# Patient Record
Sex: Female | Born: 1935 | Race: White | Hispanic: No | Smoking: Former smoker
Health system: Southern US, Community
[De-identification: ages and names within clinical notes are randomized; demographics above are authoritative.]

## PROBLEM LIST (undated history)

## (undated) DIAGNOSIS — I10 Essential (primary) hypertension: Secondary | ICD-10-CM

## (undated) DIAGNOSIS — D899 Disorder involving the immune mechanism, unspecified: Secondary | ICD-10-CM

## (undated) DIAGNOSIS — M353 Polymyalgia rheumatica: Secondary | ICD-10-CM

## (undated) DIAGNOSIS — I4891 Unspecified atrial fibrillation: Secondary | ICD-10-CM

## (undated) HISTORY — PX: BLADDER SURGERY: SHX569

## (undated) HISTORY — PX: ABDOMINAL HYSTERECTOMY: SHX81

## (undated) HISTORY — PX: OOPHORECTOMY: SHX86

---

## 2012-04-07 DIAGNOSIS — H401290 Low-tension glaucoma, unspecified eye, stage unspecified: Secondary | ICD-10-CM | POA: Insufficient documentation

## 2012-04-07 DIAGNOSIS — Z961 Presence of intraocular lens: Secondary | ICD-10-CM | POA: Insufficient documentation

## 2012-04-21 DIAGNOSIS — Z9889 Other specified postprocedural states: Secondary | ICD-10-CM | POA: Insufficient documentation

## 2012-08-03 DIAGNOSIS — H04129 Dry eye syndrome of unspecified lacrimal gland: Secondary | ICD-10-CM | POA: Insufficient documentation

## 2014-11-11 DIAGNOSIS — S52502A Unspecified fracture of the lower end of left radius, initial encounter for closed fracture: Secondary | ICD-10-CM | POA: Insufficient documentation

## 2016-01-07 ENCOUNTER — Emergency Department (HOSPITAL_BASED_OUTPATIENT_CLINIC_OR_DEPARTMENT_OTHER)
Admission: EM | Admit: 2016-01-07 | Discharge: 2016-01-08 | Disposition: A | Discharge status: Home / Self Care | Payer: Medicare Other | Attending: Emergency Medicine | Admitting: Emergency Medicine

## 2016-01-07 ENCOUNTER — Emergency Department (HOSPITAL_BASED_OUTPATIENT_CLINIC_OR_DEPARTMENT_OTHER): Payer: Medicare Other

## 2016-01-07 ENCOUNTER — Encounter (HOSPITAL_BASED_OUTPATIENT_CLINIC_OR_DEPARTMENT_OTHER): Payer: Self-pay

## 2016-01-07 DIAGNOSIS — Y9389 Activity, other specified: Secondary | ICD-10-CM | POA: Insufficient documentation

## 2016-01-07 DIAGNOSIS — Z87891 Personal history of nicotine dependence: Secondary | ICD-10-CM | POA: Insufficient documentation

## 2016-01-07 DIAGNOSIS — Y999 Unspecified external cause status: Secondary | ICD-10-CM | POA: Diagnosis not present

## 2016-01-07 DIAGNOSIS — I10 Essential (primary) hypertension: Secondary | ICD-10-CM | POA: Diagnosis not present

## 2016-01-07 DIAGNOSIS — Z7982 Long term (current) use of aspirin: Secondary | ICD-10-CM | POA: Diagnosis not present

## 2016-01-07 DIAGNOSIS — W07XXXA Fall from chair, initial encounter: Secondary | ICD-10-CM | POA: Insufficient documentation

## 2016-01-07 DIAGNOSIS — M79672 Pain in left foot: Secondary | ICD-10-CM | POA: Insufficient documentation

## 2016-01-07 DIAGNOSIS — Y929 Unspecified place or not applicable: Secondary | ICD-10-CM | POA: Insufficient documentation

## 2016-01-07 DIAGNOSIS — Z7952 Long term (current) use of systemic steroids: Secondary | ICD-10-CM | POA: Diagnosis not present

## 2016-01-07 DIAGNOSIS — Z79899 Other long term (current) drug therapy: Secondary | ICD-10-CM | POA: Diagnosis not present

## 2016-01-07 DIAGNOSIS — W19XXXA Unspecified fall, initial encounter: Secondary | ICD-10-CM

## 2016-01-07 HISTORY — DX: Essential (primary) hypertension: I10

## 2016-01-07 HISTORY — DX: Polymyalgia rheumatica: M35.3

## 2016-01-07 HISTORY — DX: Disorder involving the immune mechanism, unspecified: D89.9

## 2016-01-07 NOTE — ED Notes (Signed)
MD at bedside discussing test results and dispo plan of care. 

## 2016-01-07 NOTE — ED Notes (Signed)
Pt was able to ambulate but with pain.  Sts she does not walk well without shoes and she does not have her left shoe with her.  Sts she does better with support since she has a high arch.

## 2016-01-07 NOTE — ED Notes (Addendum)
Pt fell asleep in chair-fell out of chair with hx of same for years-injured left foot approx 630pm-pain to left foot-states she did strike her head with no LOC-presents to triage in w/c and adult female

## 2016-01-07 NOTE — Discharge Instructions (Signed)
Return to the ED with any concerns including increased pain, swelling/numbness/discoloration of foot or toes, decreased level of alertness/lethargy, or any other alarming symptoms 

## 2016-01-07 NOTE — ED Provider Notes (Signed)
CSN: [id number]     Arrival date & time 01/07/16  2019 History   By signing my name below, I, Jodi Snyder, attest that this documentation has been prepared under the direction and in the presence of No att. providers found. Electronically Signed: Linus GalasMaharshi Snyder, ED Scribe. 01/08/2016. 10:01 PM.   Chief Complaint  Patient presents with  . Fall   The history is provided by the patient. No language interpreter was used.   HPI Comments: Jodi Snyder is a 82 y.o. female with a PMHx of HTN who presents to the Emergency Department complaining of right foot pain s/p fall 4 hours ago. Pt states she was sitting at the table in a chair writing a paper when she fell asleep and fell out of her chair. She states she hit her head on a hard surface but denies any LOC. Since her fall, she has not tried to bear any weight on her right foot but reports pain with movement. Pt denies any HA, nausea, vomiting, seizures, or any other symptoms at this time. Pt is not on any blood thinners.  Past Medical History  Diagnosis Date  . Hypertension   . Polymyalgia (HCC)   . Immune system disorder [facility](HCC)    Past Surgical History  Procedure Laterality Date  . Oophorectomy    . Abdominal hysterectomy    . Bladder surgery     No family history on file. Social History  Substance Use Topics  . Smoking status: Former Games developermoker  . Smokeless tobacco: None  . Alcohol Use: Yes     Comment: occ   OB History    No data available     Review of Systems  Gastrointestinal: Negative for nausea and vomiting.  Musculoskeletal: Positive for arthralgias.  Neurological: Negative for seizures, syncope and headaches.  All other systems reviewed and are negative.  Allergies  Penicillins  Home Medications   Prior to Admission medications   Medication Sig Start Date End Date Taking? Authorizing Provider  ALPRAZolam Prudy Feeler(XANAX) 0.25 MG tablet Take 0.25 mg by mouth at bedtime as needed for anxiety.   Yes Historical Provider, MD   aspirin 81 MG tablet Take 81 mg by mouth daily.   Yes Historical Provider, MD  bimatoprost (LUMIGAN) 0.03 % ophthalmic solution 1 drop at bedtime.   Yes Historical Provider, MD  brinzolamide (AZOPT) 1 % ophthalmic suspension 1 drop 3 (three) times daily.   Yes Historical Provider, MD  cycloSPORINE (RESTASIS) 0.05 % ophthalmic emulsion 1 drop 2 (two) times daily.   Yes Historical Provider, MD  hydroxychloroquine (PLAQUENIL) 200 MG tablet Take 200 mg by mouth daily.   Yes Historical Provider, MD  lactobacillus acidophilus (BACID) TABS tablet Take 2 tablets by mouth 3 (three) times daily.   Yes Historical Provider, MD  lisinopril (PRINIVIL,ZESTRIL) 20 MG tablet Take by mouth daily.   Yes Historical Provider, MD  predniSONE (DELTASONE) 1 MG tablet Take 1 mg by mouth daily with breakfast.   Yes Historical Provider, MD  traMADol-acetaminophen (ULTRACET) 37.5-325 MG tablet Take 1 tablet by mouth every 6 (six) hours as needed.   Yes Historical Provider, MD   BP 192/91 mmHg  Pulse 78  Temp(Src) 98.3 F (36.8 C) (Oral)  Resp 16  Ht 5\' 3"  (1.6 m)  Wt 47.628 kg  BMI 18.60 kg/m2  SpO2 100%  Vitals reviewed Physical Exam  Physical Examination: General appearance - alert, well appearing, and in no distress Mental status - alert, oriented to person, place, and time Head- NCAT  Eyes - pupils equal and reactive, extraocular eye movements intact Neck - supple, no significant adenopathy, no midline tenderness to palpation Neurological - alert, oriented, normal speech, no focal findings or movement disorder noted Musculoskeletal - no joint tenderness, deformity or swelling- other than left foot with ttp over dorsum of foot and lateral aspect Extremities - peripheral pulses normal, no pedal edema, no clubbing or cyanosis Skin - normal coloration and turgor, no rashes  ED Course  Procedures  DIAGNOSTIC STUDIES: Oxygen Saturation is 100% on room air, normal by my interpretation.    COORDINATION OF  CARE: 9:55 PM Will order left foot x-ray. Discussed treatment plan with pt at bedside and pt agreed to plan.  Labs Review Labs Reviewed - No data to display  Imaging Review Dg Foot Complete Left  01/07/2016  CLINICAL DATA:  Status post fall this evening with pain and swelling across the left first through fifth base of metatarsals. EXAM: LEFT FOOT - COMPLETE 3+ VIEW COMPARISON:  None. FINDINGS: There is no evidence of fracture or dislocation. There is no evidence of arthropathy or other focal bone abnormality. Soft tissues are unremarkable. There is osteopenia. IMPRESSION: No acute fracture or dislocation. Electronically Signed   By: Sherian Rein M.D.   On: 01/07/2016 21:49   I have personally reviewed and evaluated these images and lab results as part of my medical decision-making.   EKG Interpretation None      MDM   Final diagnoses:  Left foot pain  Fall, initial encounter    Pt presenting with pain in left foot after fall out of a chair.  Xray is reassuring.  Pt was able to bear weight  wtihout difficulty- she states she has apost op shoe at home and will wear that- ace wrap applied for her comfort.  Mild head injury, no hematoma- no headache, no LOC, no vomiting or seizure activity.  She does not take blood thinners.  No indication for imaging at this time.  Discharged with strict return precautions.  Pt agreeable with plan.  I personally performed the services described in this documentation, which was scribed in my presence. The recorded information has been reviewed and is accurate.     Jerelyn Scott, MD 01/08/16 [phone]

## 2016-01-16 DIAGNOSIS — N183 Chronic kidney disease, stage 3 unspecified: Secondary | ICD-10-CM | POA: Insufficient documentation

## 2016-01-16 DIAGNOSIS — L930 Discoid lupus erythematosus: Secondary | ICD-10-CM | POA: Insufficient documentation

## 2016-01-16 DIAGNOSIS — F411 Generalized anxiety disorder: Secondary | ICD-10-CM | POA: Insufficient documentation

## 2016-01-16 DIAGNOSIS — M353 Polymyalgia rheumatica: Secondary | ICD-10-CM | POA: Insufficient documentation

## 2016-01-16 DIAGNOSIS — M810 Age-related osteoporosis without current pathological fracture: Secondary | ICD-10-CM | POA: Insufficient documentation

## 2016-01-16 DIAGNOSIS — I129 Hypertensive chronic kidney disease with stage 1 through stage 4 chronic kidney disease, or unspecified chronic kidney disease: Secondary | ICD-10-CM | POA: Insufficient documentation

## 2016-01-28 DIAGNOSIS — Z79899 Other long term (current) drug therapy: Secondary | ICD-10-CM | POA: Insufficient documentation

## 2016-06-21 DIAGNOSIS — H409 Unspecified glaucoma: Secondary | ICD-10-CM | POA: Insufficient documentation

## 2016-11-25 ENCOUNTER — Emergency Department (HOSPITAL_BASED_OUTPATIENT_CLINIC_OR_DEPARTMENT_OTHER)
Admission: EM | Admit: 2016-11-25 | Discharge: 2016-11-25 | Disposition: A | Discharge status: Home / Self Care | Payer: Medicare Other | Attending: Emergency Medicine | Admitting: Emergency Medicine

## 2016-11-25 ENCOUNTER — Encounter (HOSPITAL_BASED_OUTPATIENT_CLINIC_OR_DEPARTMENT_OTHER): Payer: Self-pay

## 2016-11-25 DIAGNOSIS — R200 Anesthesia of skin: Secondary | ICD-10-CM | POA: Insufficient documentation

## 2016-11-25 DIAGNOSIS — Z87891 Personal history of nicotine dependence: Secondary | ICD-10-CM | POA: Diagnosis not present

## 2016-11-25 DIAGNOSIS — Z7982 Long term (current) use of aspirin: Secondary | ICD-10-CM | POA: Insufficient documentation

## 2016-11-25 DIAGNOSIS — M25572 Pain in left ankle and joints of left foot: Secondary | ICD-10-CM | POA: Insufficient documentation

## 2016-11-25 DIAGNOSIS — Z79899 Other long term (current) drug therapy: Secondary | ICD-10-CM | POA: Insufficient documentation

## 2016-11-25 DIAGNOSIS — I10 Essential (primary) hypertension: Secondary | ICD-10-CM | POA: Diagnosis not present

## 2016-11-25 MED ORDER — LIDOCAINE 5 % EX OINT: 1.0000 "application " | TOPICAL_OINTMENT | Freq: Three times a day (TID) | CUTANEOUS | 0 refills | Status: DC | PRN

## 2016-11-25 NOTE — ED Provider Notes (Signed)
[facility] Provider Note   CSN: [id number] Arrival date & time: 11/25/16  1755  By signing my name below, I, Doreatha Martin, attest that this documentation has been prepared under the direction and in the presence of Nira Conn, MD. Electronically Signed: Doreatha Martin, ED Scribe. 11/25/16. 6:25 PM.     History   Chief Complaint Chief Complaint  Patient presents with  . Foot Pain    HPI Jodi Snyder is a 83 y.o. female who presents to the Emergency Department complaining of moderate, gradually worsening lateral left foot pain that began at 2:30 pm after pushing heavy boxes with the foot. She denies hearing a "pop" during the activity. Pt states her pain is worsened with ankle extension and alleviated with Tramadol. She notes that she was pain free prior to this afternoon. She denies additional trauma, injury or falls. Pt reports she sprained the foot in the same area a year ago, and the pain improved quickly after the initial injury. She also complains that her bilateral foot numbness has worsened from her baseline, and reports she will follow up with her PCP regarding the issue. No h/o DM. She denies weakness.    The history is provided by the patient. No language interpreter was used.    Past Medical History:  Diagnosis Date  . Hypertension   . Immune system disorder (HCC)   . Polymyalgia (HCC)     There are no active problems to display for this patient.   Past Surgical History:  Procedure Laterality Date  . ABDOMINAL HYSTERECTOMY    . BLADDER SURGERY    . OOPHORECTOMY      OB History    No data available       Home Medications    Prior to Admission medications   Medication Sig Start Date End Date Taking? Authorizing Provider  ALPRAZolam Prudy Feeler) 0.25 MG tablet Take 0.25 mg by mouth at bedtime as needed for anxiety.    Historical Provider, MD  aspirin 81 MG tablet Take 81 mg by mouth daily.    Historical Provider, MD  bimatoprost (LUMIGAN)  0.03 % ophthalmic solution 1 drop at bedtime.    Historical Provider, MD  brinzolamide (AZOPT) 1 % ophthalmic suspension 1 drop 3 (three) times daily.    Historical Provider, MD  cycloSPORINE (RESTASIS) 0.05 % ophthalmic emulsion 1 drop 2 (two) times daily.    Historical Provider, MD  hydroxychloroquine (PLAQUENIL) 200 MG tablet Take 200 mg by mouth daily.    Historical Provider, MD  lactobacillus acidophilus (BACID) TABS tablet Take 2 tablets by mouth 3 (three) times daily.    Historical Provider, MD  lidocaine (XYLOCAINE) 5 % ointment Apply 1 application topically 3 (three) times daily as needed. 11/25/16   Nira Conn, MD  lisinopril (PRINIVIL,ZESTRIL) 20 MG tablet Take by mouth daily.    Historical Provider, MD  predniSONE (DELTASONE) 1 MG tablet Take 1 mg by mouth daily with breakfast.    Historical Provider, MD  traMADol-acetaminophen (ULTRACET) 37.5-325 MG tablet Take 1 tablet by mouth every 6 (six) hours as needed.    Historical Provider, MD    Family History No family history on file.  Social History Social History  Substance Use Topics  . Smoking status: Former Games developer  . Smokeless tobacco: Never Used  . Alcohol use No     Allergies   Penicillins   Review of Systems Review of Systems  Musculoskeletal: Positive for arthralgias.  Neurological: Positive for numbness. Negative for weakness.  Physical Exam Updated Vital Signs BP (!) 162/84 (BP Location: Right Arm)   Pulse 70   Temp 99.3 F (37.4 C) (Oral)   Resp 18   Ht  (1.6 m)   Wt 110 lb (49.9 kg)   SpO2 100%   BMI 19.49 kg/m   Physical Exam  Constitutional: She is oriented to person, place, and time. She appears well-developed and well-nourished. No distress.  HENT:  Head: Normocephalic and atraumatic.  Right Ear: External ear normal.  Left Ear: External ear normal.  Nose: Nose normal.  Eyes: Conjunctivae and EOM are normal. No scleral icterus.  Neck: Normal range of motion and phonation  normal.  Cardiovascular: Normal rate, regular rhythm and intact distal pulses.   DP pulses 2+ and equal.    Pulmonary/Chest: Effort normal. No stridor. No respiratory distress.  Abdominal: She exhibits no distension.  Musculoskeletal: Normal range of motion. She exhibits tenderness. She exhibits no edema.       Left ankle: She exhibits normal range of motion, no swelling and no deformity. Tenderness. AITFL and CF ligament tenderness found. No lateral malleolus, no medial malleolus, no posterior TFL, no head of 5th metatarsal and no proximal fibula tenderness found. Achilles tendon exhibits no pain, no defect and normal Thompson's test results.       Left foot: There is no tenderness, no bony tenderness, no swelling and no deformity.       Feet:  No peripheral edema. Achilles tendon intact.   Neurological: She is alert and oriented to person, place, and time.  Plantarflexion intact.   Skin: She is not diaphoretic.  Psychiatric: She has a normal mood and affect. Her behavior is normal.  Vitals reviewed.    ED Treatments / Results   DIAGNOSTIC STUDIES: Oxygen Saturation is 100% on RA, normal by my interpretation.    COORDINATION OF CARE: 6:19 PM Discussed treatment plan with pt at bedside which includes ace wrap and pt agreed to plan.    Labs (all labs ordered are listed, but only abnormal results are displayed) Labs Reviewed - No data to display  EKG  EKG Interpretation None       Radiology No results found.  Procedures Procedures (including critical care time)  Medications Ordered in ED Medications - No data to display   Initial Impression / Assessment and Plan / ED Course  I have reviewed the triage vital signs and the nursing notes.  Pertinent labs & imaging results that were available during my care of the patient were reviewed by me and considered in my medical decision making (see chart for details).     Most consistent with MSK pain likely secondary to  strenuous/forceful activity. No evidence to suggest a DVT or arterial occlusion. Low suspicion for septic arthritis. Discussed symptomatic treatment.  The patient is safe for discharge with strict return precautions.   Final Clinical Impressions(s) / ED Diagnoses   Final diagnoses:  Acute left ankle pain   Disposition: Discharge  Condition: Good  I have discussed the results, Dx and Tx plan with the patient who expressed understanding and agree(s) with the plan. Discharge instructions discussed at great length. The patient was given strict return precautions who verbalized understanding of the instructions. No further questions at time of discharge.    New Prescriptions   LIDOCAINE (XYLOCAINE) 5 % OINTMENT    Apply 1 application topically 3 (three) times daily as needed.    Follow Up: Kaylyn Layer. Landry Mellow, MD [address]  [place] [address] [phone]  Schedule an appointment as soon as possible for a visit  in 5-7 days, If symptoms do not improve or  worsen   I personally performed the services described in this documentation, which was scribed in my presence. The recorded information has been reviewed and is accurate.        Nira Conn, MD 11/25/16 [phone]

## 2016-11-25 NOTE — ED Triage Notes (Signed)
c/o pain to left foot since 230pm today-denies injury-presents to triage in w/c-NAD

## 2017-02-03 ENCOUNTER — Emergency Department (HOSPITAL_COMMUNITY)
Admission: EM | Admit: 2017-02-03 | Discharge: 2017-02-03 | Disposition: A | Discharge status: Home / Self Care | Payer: Medicare Other | Attending: Emergency Medicine | Admitting: Emergency Medicine

## 2017-02-03 ENCOUNTER — Emergency Department (HOSPITAL_COMMUNITY): Payer: Medicare Other

## 2017-02-03 ENCOUNTER — Encounter (HOSPITAL_COMMUNITY): Payer: Self-pay

## 2017-02-03 DIAGNOSIS — R0602 Shortness of breath: Secondary | ICD-10-CM | POA: Diagnosis not present

## 2017-02-03 DIAGNOSIS — Z87891 Personal history of nicotine dependence: Secondary | ICD-10-CM | POA: Insufficient documentation

## 2017-02-03 DIAGNOSIS — Z7982 Long term (current) use of aspirin: Secondary | ICD-10-CM | POA: Insufficient documentation

## 2017-02-03 DIAGNOSIS — I4891 Unspecified atrial fibrillation: Secondary | ICD-10-CM

## 2017-02-03 DIAGNOSIS — Z880 Allergy status to penicillin: Secondary | ICD-10-CM | POA: Diagnosis not present

## 2017-02-03 DIAGNOSIS — Z79899 Other long term (current) drug therapy: Secondary | ICD-10-CM | POA: Insufficient documentation

## 2017-02-03 DIAGNOSIS — R079 Chest pain, unspecified: Secondary | ICD-10-CM | POA: Diagnosis present

## 2017-02-03 DIAGNOSIS — I10 Essential (primary) hypertension: Secondary | ICD-10-CM | POA: Diagnosis not present

## 2017-02-03 LAB — CBC
HCT: 44.1 % (ref 36.0–46.0)
Hemoglobin: 14.2 g/dL (ref 12.0–15.0)
MCH: 31.1 pg (ref 26.0–34.0)
MCHC: 32.2 g/dL (ref 30.0–36.0)
MCV: 96.7 fL (ref 78.0–100.0)
PLATELETS: 183 10*3/uL (ref 150–400)
RBC: 4.56 MIL/uL (ref 3.87–5.11)
RDW: 13.5 % (ref 11.5–15.5)
WBC: 6.1 10*3/uL (ref 4.0–10.5)

## 2017-02-03 LAB — BASIC METABOLIC PANEL
ANION GAP: 9 (ref 5–15)
BUN: 25 mg/dL — AB (ref 6–20)
CALCIUM: 9.7 mg/dL (ref 8.9–10.3)
CO2: 27 mmol/L (ref 22–32)
Chloride: 107 mmol/L (ref 101–111)
Creatinine, Ser: 1.03 mg/dL — ABNORMAL HIGH (ref 0.44–1.00)
GFR calc Af Amer: 57 mL/min — ABNORMAL LOW (ref >60)
GFR, EST NON AFRICAN AMERICAN: 49 mL/min — AB (ref >60)
GLUCOSE: 88 mg/dL (ref 65–99)
Potassium: 3.5 mmol/L (ref 3.5–5.1)
Sodium: 143 mmol/L (ref 135–145)

## 2017-02-03 LAB — MAGNESIUM: Magnesium: 2.3 mg/dL (ref 1.7–2.4)

## 2017-02-03 LAB — I-STAT TROPONIN, ED: TROPONIN I, POC: 0.01 ng/mL (ref 0.00–0.08)

## 2017-02-03 MED ORDER — APIXABAN 2.5 MG PO TABS: 2.5000 mg | ORAL_TABLET | Freq: Two times a day (BID) | ORAL | 0 refills | Status: DC

## 2017-02-03 MED ORDER — PROPOFOL 10 MG/ML IV BOLUS
1.0000 mg/kg | Freq: Once | INTRAVENOUS | Status: AC
  Administered 2017-02-03: 48.5 mg via INTRAVENOUS
  Filled 2017-02-03: qty 20

## 2017-02-03 MED ORDER — APIXABAN 2.5 MG PO TABS
2.5000 mg | ORAL_TABLET | Freq: Two times a day (BID) | ORAL | Status: DC
  Administered 2017-02-03: 2.5 mg via ORAL
  Filled 2017-02-03: qty 1

## 2017-02-03 NOTE — ED Provider Notes (Signed)
Complained of chest pain accompanied by lightheadedness and shortness of breath onset 6:30 AM today. She is presently asymptomatic without treatment. Noted by EMS to be in atrial fibrillation with rapid ventricular response is one tender 1 60 bpm. On exam alert and in no distress HEENT exam no facial asymmetry neck supple no bruit lungs clear auscultation heart irregularly irregular tachycardic abdomen nondistended nontender extremities without edema. Skin warm dry. Vagal maneuvers attempted outpatient by forcible Valsalva maneuver. Without change in rate and rhythm. ED ECG REPORT   Date: 02/03/2017  Rate: 130  Rhythm: atrial fibrillation  QRS Axis: normal  Intervals: normal  ST/T Wave abnormalities: nonspecific T wave changes  Conduction Disutrbances:none  Narrative Interpretation:   Old EKG Reviewed: none available  I have personally reviewed the EKG tracing and agree with the computerized printout as noted.  Patient was cardioverted in the emergency department after discussion with Dr.Skains by telephone. Patient is in agreement. Consent obtained for procedural sedation and cardioversion, timeout performed,. I was present and supervised  entire procedure to its conclusion when patient was fully awake at 9:52 AM. See sedation navigator for procedural sedation note. Chest x-ray viewed by me     Doug SouJacubowitz, Kaedyn Belardo, MD 02/03/17 1511

## 2017-02-03 NOTE — Progress Notes (Signed)
ANTICOAGULATION CONSULT NOTE - Initial Consult  Pharmacy Consult for Eliquis  Indication: atrial fibrillation  Allergies  Allergen Reactions  . Penicillins     Patient Measurements: Height: 5\' 3"  (160 cm) Weight: 107 lb (48.5 kg) IBW/kg (Calculated) : 52.4  Vital Signs: Temp: 98.2 F (36.8 C) (06/21 0826) Temp Source: Oral (06/21 0826) BP: 151/113 (06/21 0823) Pulse Rate: 120 (06/21 0823)  Labs:  Recent Labs  02/03/17 0819  HGB 14.2  HCT 44.1  PLT 183  CREATININE 1.03*    Estimated Creatinine Clearance: 32.2 mL/min (A) (by C-G formula based on SCr of 1.03 mg/dL (H)).   Medical History: Past Medical History:  Diagnosis Date  . Hypertension   . Immune system disorder (HCC)   . Polymyalgia [facility](HCC)     Assessment: 83 yo female admitted with new onset Afib. Pharmacy consulted to dose Eliquis. Not on PTA anticoagulation per medication list. CBC stable, SCr 1.03.   Based on patient age 66>80 and weight <60 kg, meets criteria for reduced dose Eliquis at 2.5mg  BID.   Plan:  Eliquis 2.5 mg BID  Monitor for s/s bleeding F/u education    York CeriseKatherine Cook, PharmD Pharmacy Resident  Pager [phone] 02/03/17 9:18 AM

## 2017-02-03 NOTE — ED Provider Notes (Signed)
[facility]EM[facility]ENCY DEPT Provider Note   CSN: [id number] Arrival date & time: 02/03/17  0813     History   Chief Complaint Chief Complaint  Patient presents with  . Chest Pain  . Atrial Fibrillation    HPI Jodi Snyder is a 83 y.o. female.  HPI  83 y.o. female with a hx of HTN, presents to the Emergency Department today due to shortness of breath with chest pain this morning while at rest. This occurred around 0645 this morning. Pt states chest pain lasted 30 min prior to EMS arrival. No N/V. No diaphoresis. CP centralized without radiation. No hx ACS. Denies chest pain currently. Feels minor shortness of breath. No fevers. No URI symptoms. Pt does not take anticoagulation of any kind. No other symptoms noted.    Past Medical History:  Diagnosis Date  . Hypertension   . Immune system disorder (HCC)   . Polymyalgia (HCC)     There are no active problems to display for this patient.   Past Surgical History:  Procedure Laterality Date  . ABDOMINAL HYSTERECTOMY    . BLADDER SURGERY    . OOPHORECTOMY      OB History    No data available       Home Medications    Prior to Admission medications   Medication Sig Start Date End Date Taking? Authorizing Provider  ALPRAZolam Prudy Feeler) 0.25 MG tablet Take 0.25 mg by mouth at bedtime as needed for anxiety.    [provider]  aspirin 81 MG tablet Take 81 mg by mouth daily.    [provider]  bimatoprost (LUMIGAN) 0.03 % ophthalmic solution 1 drop at bedtime.    [provider]  brinzolamide (AZOPT) 1 % ophthalmic suspension 1 drop 3 (three) times daily.    [provider]  cycloSPORINE (RESTASIS) 0.05 % ophthalmic emulsion 1 drop 2 (two) times daily.    [provider]  hydroxychloroquine (PLAQUENIL) 200 MG tablet Take 200 mg by mouth daily.    [provider]  lactobacillus acidophilus (BACID) TABS tablet Take 2 tablets by mouth 3 (three) times daily.    [provider]  lidocaine (XYLOCAINE) 5 % ointment Apply 1 application topically 3 (three) times daily as needed. 11/25/16   Nira Conn, MD  lisinopril (PRINIVIL,ZESTRIL) 20 MG tablet Take by mouth daily.    [provider]  predniSONE (DELTASONE) 1 MG tablet Take 1 mg by mouth daily with breakfast.    [provider]  traMADol-acetaminophen (ULTRACET) 37.5-325 MG tablet Take 1 tablet by mouth every 6 (six) hours as needed.    [provider]    Family History History reviewed. No pertinent family history.  Social History Social History  Substance Use Topics  . Smoking status: Former Games developer  . Smokeless tobacco: Never Used  . Alcohol use No     Allergies   Penicillins   Review of Systems Review of Systems ROS reviewed and all are negative for acute change except as noted in the HPI.  Physical Exam Updated Vital Signs BP (!) 151/113   Pulse (!) 120   Temp 98.2 F (36.8 C) (Oral)   Resp 14   Ht 5\' 3"  (1.6 m)   Wt 48.5 kg (107 lb)   SpO2 100%   BMI 18.95 kg/m   Physical Exam  Constitutional: She is oriented to person, place, and time. Vital signs are normal. She appears well-developed and well-nourished. No distress.  HENT:  Head: Normocephalic and atraumatic.  Right Ear: Hearing, tympanic membrane, external ear and ear canal normal.  Left Ear: Hearing, tympanic membrane, external ear and ear canal normal.  Nose: Nose normal.  Mouth/Throat: Uvula is midline, oropharynx is clear and moist and mucous membranes are normal. No trismus in the jaw. No oropharyngeal exudate, posterior oropharyngeal erythema or tonsillar abscesses.  Eyes: Conjunctivae and EOM are normal. Pupils are equal, round, and reactive to light.  Neck: Normal range of motion. Neck supple. No tracheal deviation present.  Cardiovascular: S1 normal, S2 normal, normal heart sounds, intact distal pulses and normal pulses.  An irregularly irregular rhythm present.  Tachycardia present.   Pulmonary/Chest: Effort normal and breath sounds normal. No respiratory distress. She has no decreased breath sounds. She has no wheezes. She has no rhonchi. She has no rales.  Abdominal: Normal appearance and bowel sounds are normal. There is no tenderness.  Musculoskeletal: Normal range of motion.  Neurological: She is alert and oriented to person, place, and time.  Skin: Skin is warm and dry.  Psychiatric: She has a normal mood and affect. Her speech is normal and behavior is normal. Thought content normal.  Nursing note and vitals reviewed.    ED Treatments / Results  Labs (all labs ordered are listed, but only abnormal results are displayed) Labs Reviewed  BASIC METABOLIC PANEL - Abnormal; Notable for the following:       Result Value   BUN 25 (*)    Creatinine, Ser 1.03 (*)    GFR calc non Af Amer 49 (*)    GFR calc Af Amer 57 (*)    All other components within normal limits  CBC  MAGNESIUM  I-STAT TROPOININ, ED    EKG  EKG Interpretation  Date/Time:  Thursday February 03 2017 09:57:33 EDT Ventricular Rate:  63 PR Interval:    QRS Duration: 86 QT Interval:  421 QTC Calculation: 431 R Axis:   73 Text Interpretation:  Sinus rhythm Anteroseptal infarct, age indeterminate Baseline wander in lead(s) V3 Confirmed by Doug SouJacubowitz, Sam [phone]([id number]) on 02/03/2017 10:22:04 AM       Radiology Dg Chest Portable 1 View  Result Date: 02/03/2017 CLINICAL DATA:  Chest pain EXAM: PORTABLE CHEST 1 VIEW COMPARISON:  None. FINDINGS: There is apical scarring bilaterally. There are scattered calcified granulomas in the right lower lobe. Lungs elsewhere are clear. Heart size and pulmonary vascularity are normal. No adenopathy. There is aortic atherosclerosis. No evident bone lesions. IMPRESSION: Apical scarring bilaterally. Scattered small granulomas. No edema or consolidation. There is aortic atherosclerosis. Electronically Signed   By: Bretta BangWilliam  Woodruff III M.D.   On:  02/03/2017 09:10    Procedures .Cardioversion Date/Time: 02/03/2017 9:59 AM Performed by: Audry PiliMOHR, Shaden Higley Authorized by: Audry PiliMOHR, Sandrika Schwinn   Consent:    Consent obtained:  Verbal and written   Consent given by:  Patient   Risks discussed:  Death and cutaneous burn   Alternatives discussed:  No treatment Pre-procedure details:    Cardioversion basis:  Elective   Rhythm:  Atrial fibrillation   Electrode placement:  Anterior-lateral Attempt one:    Cardioversion mode:  Synchronous   Waveform:  Biphasic   Shock (Joules):  200   Shock outcome:  Conversion to normal sinus rhythm Post-procedure details:    Patient status:  Awake   Patient tolerance of procedure:  Tolerated well, no immediate complications Comments:     Successful Cardioversion.    (including critical care time) CRITICAL CARE Performed by: Eston Estersyler M Shrihaan Porzio   Total critical care time:  40 minutes  Critical care time was exclusive of separately billable procedures and treating other patients.  Critical care was necessary to treat or prevent imminent or life-threatening deterioration.  Critical care was time spent personally by me on the following activities: development of treatment plan with patient and/or surrogate as well as nursing, discussions with consultants, evaluation of patient's response to treatment, examination of patient, obtaining history from patient or surrogate, ordering and performing treatments and interventions, ordering and review of laboratory studies, ordering and review of radiographic studies, pulse oximetry and re-evaluation of patient's condition.   Medications Ordered in ED Medications  apixaban (ELIQUIS) tablet 2.5 mg (not administered)  propofol (DIPRIVAN) 10 mg/mL bolus/IV push 48.5 mg (48.5 mg Intravenous Given 02/03/17 0945)     Initial Impression / Assessment and Plan / ED Course  I have reviewed the triage vital signs and the nursing notes.  Pertinent labs & imaging results that were  available during my care of the patient were reviewed by me and considered in my medical decision making (see chart for details).  Final Clinical Impressions(s) / ED Diagnoses  {I have reviewed and evaluated the relevant laboratory values. {I have reviewed and evaluated the relevant imaging studies. {I have interpreted the relevant EKG. {I have reviewed the relevant previous healthcare records. {I have reviewed EMS Documentation. {I obtained HPI from historian. {Patient discussed with supervising physician.  ED Course:  Assessment: Pt is a 83 y.o. female with hx HTN who presents with chest pain and shortness of breath this AM. Onset 0645. Chest pain lasted 30 min. Centralized. No radiation. No N/V. No diaphoresis. Resolved with EMS arrival. No meds given. No hx ACS. No cardiac hx. No anticoagulation. On exam, pt in NAD. Nontoxic/nonseptic appearing. VS with tachycardia. Afebrile. Lungs CTA. Abdomen nontender soft. EKG shows Afib with RVR. CHADVASc 4 (Age, Female, HTN). Pt within window for possibel cardioversion. Made NPO. Last PO was last night around 2040. Consult to Cardiology (Dr. Anne Fu). Pt candidate for Cardioversion. Will perform in ED. Consulted Pharmacy for anticoagulation. Will Rx Eliquis 2.5mg  BID. CBC unremarkable. BMP unremarkable. Trop negative. CXR unremarkable. Discussed and seen with supervising physician. Plan is to DC home with ambulatory referral to Afib clinic. At time of discharge, Patient is in no acute distress. Vital Signs are stable. Patient is able to ambulate. Patient able to tolerate PO.   Cardioversion with supervising physician present: Time out called at 0944 Propofol given 0945 Cardioverted successfully 0947 Awake and Alert [phone]  Disposition/Plan:  DC Home Additional Verbal discharge instructions given and discussed with patient.  Pt Instructed to f/u with Cardiology in the next week for evaluation and treatment of symptoms. Return precautions given Pt  acknowledges and agrees with plan  Supervising Physician Doug Sou, MD  Final diagnoses:  Atrial fibrillation with rapid ventricular response [facility])    New Prescriptions New Prescriptions   No medications on file       Audry Pili, Cordelia Poche 02/03/17 1022    Doug Sou, MD 02/03/17 1124

## 2017-02-03 NOTE — Discharge Instructions (Signed)
Please read and follow all provided instructions.  Your diagnoses today include:  1. Atrial fibrillation with rapid ventricular response (HCC)     Tests performed today include:  Vital signs. See below for your results today.   Medications prescribed:   Take as prescribed   Home care instructions:  Follow any educational materials contained in this packet.  Follow-up instructions: Please follow-up with Cardiology for further evaluation of symptoms and treatment   Return instructions:   Please return to the Emergency Department if you do not get better, if you get worse, or new symptoms OR  - Fever (temperature greater than 101.11F)  - Bleeding that does not stop with holding pressure to the area    -Severe pain (please note that you may be more sore the day after your accident)  - Chest Pain  - Difficulty breathing  - Severe nausea or vomiting  - Inability to tolerate food and liquids  - Passing out  - Skin becoming red around your wounds  - Change in mental status (confusion or lethargy)  - New numbness or weakness     Please return if you have any other emergent concerns.  Additional Information:  Your vital signs today were: BP (!) 151/113    Pulse (!) 120    Temp 98.2 F (36.8 C) (Oral)    Resp 14    Ht 5\' 3"  (1.6 m)    Wt 48.5 kg (107 lb)    SpO2 100%    BMI 18.95 kg/m  If your blood pressure (BP) was elevated above 135/85 this visit, please have this repeated by your doctor within one month. ---------------  Information on my medicine - ELIQUIS (apixaban)  This medication education was reviewed with me or my healthcare representative as part of my discharge preparation.   Why was Eliquis prescribed for you? Eliquis was prescribed for you to reduce the risk of a blood clot forming that can cause a stroke if you have a medical condition called atrial fibrillation (a type of irregular heartbeat).  What do You need to know about Eliquis ? Take your Eliquis  TWICE DAILY - one tablet in the morning and one tablet in the evening with or without food. If you have difficulty swallowing the tablet whole please discuss with your pharmacist how to take the medication safely.  Take Eliquis exactly as prescribed by your doctor and DO NOT stop taking Eliquis without talking to the doctor who prescribed the medication.  Stopping may increase your risk of developing a stroke.  Refill your prescription before you run out.  After discharge, you should have regular check-up appointments with your healthcare provider that is prescribing your Eliquis.  In the future your dose may need to be changed if your kidney function or weight changes by a significant amount or as you get older.  What do you do if you miss a dose? If you miss a dose, take it as soon as you remember on the same day and resume taking twice daily.  Do not take more than one dose of ELIQUIS at the same time to make up a missed dose.  Important Safety Information A possible side effect of Eliquis is bleeding. You should call your healthcare provider right away if you experience any of the following: ? Bleeding from an injury or your nose that does not stop. ? Unusual colored urine (red or dark brown) or unusual colored stools (red or black). ? Unusual bruising for unknown reasons. ?  A serious fall or if you hit your head (even if there is no bleeding).  Some medicines may interact with Eliquis and might increase your risk of bleeding or clotting while on Eliquis. To help avoid this, consult your healthcare provider or pharmacist prior to using any new prescription or non-prescription medications, including herbals, vitamins, non-steroidal anti-inflammatory drugs (NSAIDs) and supplements.  This website has more information on Eliquis (apixaban): http://www.eliquis.com/eliquis/home

## 2017-02-03 NOTE — ED Triage Notes (Signed)
Pt from home with new on-set A-fib rate 110-160. Pt having mid SOB.

## 2017-02-03 NOTE — Sedation Documentation (Signed)
Shock delivered at 200 j . 

## 2017-02-04 ENCOUNTER — Encounter: Payer: Self-pay | Admitting: Cardiology

## 2017-02-07 ENCOUNTER — Ambulatory Visit (HOSPITAL_COMMUNITY)
Admission: RE | Admit: 2017-02-07 | Discharge: 2017-02-07 | Disposition: A | Discharge status: Home / Self Care | Payer: Medicare Other | Source: Ambulatory Visit | Attending: Nurse Practitioner | Admitting: Nurse Practitioner

## 2017-02-07 ENCOUNTER — Encounter (HOSPITAL_COMMUNITY): Payer: Self-pay | Admitting: Nurse Practitioner

## 2017-02-07 VITALS — BP 144/76 | HR 70 | Ht 63.0 in | Wt 104.0 lb

## 2017-02-07 DIAGNOSIS — Z880 Allergy status to penicillin: Secondary | ICD-10-CM | POA: Diagnosis not present

## 2017-02-07 DIAGNOSIS — I480 Paroxysmal atrial fibrillation: Secondary | ICD-10-CM | POA: Diagnosis not present

## 2017-02-07 DIAGNOSIS — M353 Polymyalgia rheumatica: Secondary | ICD-10-CM | POA: Insufficient documentation

## 2017-02-07 DIAGNOSIS — Z87891 Personal history of nicotine dependence: Secondary | ICD-10-CM | POA: Insufficient documentation

## 2017-02-07 DIAGNOSIS — Z79899 Other long term (current) drug therapy: Secondary | ICD-10-CM | POA: Insufficient documentation

## 2017-02-07 DIAGNOSIS — Z90710 Acquired absence of both cervix and uterus: Secondary | ICD-10-CM | POA: Insufficient documentation

## 2017-02-07 DIAGNOSIS — I10 Essential (primary) hypertension: Secondary | ICD-10-CM | POA: Insufficient documentation

## 2017-02-07 DIAGNOSIS — Z9889 Other specified postprocedural states: Secondary | ICD-10-CM | POA: Diagnosis not present

## 2017-02-07 MED ORDER — DILTIAZEM HCL 30 MG PO TABS: ORAL_TABLET | ORAL | 1 refills | Status: DC

## 2017-02-07 MED ORDER — APIXABAN 2.5 MG PO TABS: 2.5000 mg | ORAL_TABLET | Freq: Two times a day (BID) | ORAL | 6 refills | Status: DC

## 2017-02-07 NOTE — Patient Instructions (Signed)
Your physician has recommended you make the following change in your medication:  1)Stop aspirin 2)Cardizem 30mg  -- take 1 tablet every 4 hours AS NEEDED for afib heart rate >100 as long as top blood pressure >100.

## 2017-02-08 NOTE — Progress Notes (Addendum)
Primary Care Physician: Jodi Cho., MD Referring Physician: [facility] f/u   Jodi Snyder is a 83 y.o. female with a h/o HTN, Polymyalgia that presented to [facility] 6/2,with racing heart beat and lightheadedness, shortness of breath. She was found in afib with  RVR at 160 bpm. She was successfully cardioverted. The only change in her health is that her daily prednisone for polymyalgia had been increased for the last several weeks. She denies any alcohol, excessive caffeine. States sleep study in the last year which was negative. She was placed on apixaban 2.5 mg bid for a chadsvasc score of at least 4.   Today, she denies symptoms of palpitations, chest pain, shortness of breath, orthopnea, PND, lower extremity edema, dizziness, presyncope, syncope, or neurologic sequela. The patient is tolerating medications without difficulties and is otherwise without complaint today.   Past Medical History:  Diagnosis Date  . Hypertension   . Immune system disorder (HCC)   . Polymyalgia (HCC)    Past Surgical History:  Procedure Laterality Date  . ABDOMINAL HYSTERECTOMY    . BLADDER SURGERY    . OOPHORECTOMY      Current Outpatient Prescriptions  Medication Sig Dispense Refill  . ALPRAZolam (XANAX) 0.25 MG tablet Take 0.25 mg by mouth 2 (two) times daily.     Marland Kitchen apixaban (ELIQUIS) 2.5 MG TABS tablet Take 1 tablet (2.5 mg total) by mouth 2 (two) times daily. 60 tablet 6  . bimatoprost (LUMIGAN) 0.03 % ophthalmic solution Place 1 drop into both eyes at bedtime.     . Biotin (BIOTIN 5000) 5 MG CAPS Take 5 mg by mouth daily.    . brinzolamide (AZOPT) 1 % ophthalmic suspension Place 1 drop into the right eye 3 (three) times daily.     . Carboxymethylcell-Hypromellose (GENTEAL OP) Place 1 application into both eyes at bedtime.    . cycloSPORINE (RESTASIS) 0.05 % ophthalmic emulsion Place 1 drop into both eyes 2 (two) times daily.     Marland Kitchen esomeprazole (NEXIUM) 40 MG capsule Take 40 mg by mouth every  morning.    . hydroxychloroquine (PLAQUENIL) 200 MG tablet Take 200 mg by mouth daily.    . Hypromellose (ARTIFICIAL TEARS OP) Place 1 drop into both eyes daily as needed (dry eyes).    Marland Kitchen L-LYSINE PO Take 2 tablets by mouth daily.    Marland Kitchen lisinopril (PRINIVIL,ZESTRIL) 20 MG tablet Take 20 mg by mouth daily.     . Multiple Vitamins-Minerals (ICAPS AREDS 2 PO) Take 2 capsules by mouth 2 (two) times daily.    . predniSONE (DELTASONE) 1 MG tablet Take 6 mg by mouth daily with breakfast.     . Probiotic Product (PROBIOTIC PO) Take 1 tablet by mouth 2 (two) times daily.    . traMADol-acetaminophen (ULTRACET) 37.5-325 MG tablet Take 1 tablet by mouth 2 (two) times daily.     Marland Kitchen diltiazem (CARDIZEM) 30 MG tablet Take 1 tablet every 4 hours AS NEEDED for afib heart rate >100 45 tablet 1   No current facility-administered medications for this encounter.     Allergies  Allergen Reactions  . Penicillins Hives and Swelling    Has patient had a PCN reaction causing immediate rash, facial/tongue/throat swelling, SOB or lightheadedness with hypotension: Yes Has patient had a PCN reaction causing severe rash involving mucus membranes or skin necrosis: No Has patient had a PCN reaction that required hospitalization: No Has patient had a PCN reaction occurring within the last 10 years: No If  all of the above answers are "NO", then may proceed with Cephalosporin use.   . Sulfa Antibiotics Rash    Social History   Social History  . Marital status: Widowed    Spouse name: N/A  . Number of children: N/A  . Years of education: N/A   Occupational History  . Not on file.   Social History Main Topics  . Smoking status: Former Games developermoker  . Smokeless tobacco: Never Used  . Alcohol use No  . Drug use: No  . Sexual activity: Not on file   Other Topics Concern  . Not on file   Social History Narrative  . No narrative on file    No family history on file.  ROS- All systems are reviewed and negative  except as per the HPI above  Physical Exam: Vitals:   02/07/17 1418  BP: (!) 144/76  Pulse: 70  Weight: 104 lb (47.2 kg)  Height: 5\' 3"  (1.6 m)   Wt Readings from Last 3 Encounters:  02/07/17 104 lb (47.2 kg)  02/03/17 107 lb (48.5 kg)  11/25/16 110 lb (49.9 kg)    Labs: Lab Results  Component Value Date   NA 143 02/03/2017   K 3.5 02/03/2017   CL 107 02/03/2017   CO2 27 02/03/2017   GLUCOSE 88 02/03/2017   BUN 25 (H) 02/03/2017   CREATININE 1.03 (H) 02/03/2017   CALCIUM 9.7 02/03/2017   MG 2.3 02/03/2017   No results found for: INR No results found for: CHOL, HDL, LDLCALC, TRIG   GEN- The patient is well appearing, alert and oriented x 3 today.   Head- normocephalic, atraumatic Eyes-  Sclera clear, conjunctiva pink Ears- hearing intact Oropharynx- clear Neck- supple, no JVP Lymph- no cervical lymphadenopathy Lungs- Clear to ausculation bilaterally, normal work of breathing Heart- Regular rate and rhythm, no murmurs, rubs or gallops, PMI not laterally displaced GI- soft, NT, ND, + BS Extremities- no clubbing, cyanosis, or edema MS- no significant deformity or atrophy Skin- no rash or lesion Psych- euthymic mood, full affect Neuro- strength and sensation are intact  EKG-NSR, normal EKG, v rate 70 bpm, qrs int 78 ms, qtc 449 ms Epic records reviewed    Assessment and Plan: 1. Paroxysmal afib Successfully cardioverted General education re afib discussed, triggers discussed  Continue eliquis 2.5 mg bid for the 4 weeks mandatory after cardioversion as well long term with a chadsvasc score of at least 4 Stop asa Bleeding precautions discussed Will RX cardizem 30 mg as needed for breakthrough afib Echo  F/u with Dr. Elberta [facility] as [facility]  [place]burgDonna C. Matthew [place], ANP-C [facility] Clinic [facility] [address] [place], [place] [address] [phone]

## 2017-02-09 ENCOUNTER — Ambulatory Visit (HOSPITAL_BASED_OUTPATIENT_CLINIC_OR_DEPARTMENT_OTHER)
Admission: RE | Admit: 2017-02-09 | Discharge: 2017-02-09 | Disposition: A | Discharge status: Home / Self Care | Payer: Medicare Other | Source: Ambulatory Visit | Attending: Nurse Practitioner | Admitting: Nurse Practitioner

## 2017-02-09 DIAGNOSIS — I081 Rheumatic disorders of both mitral and tricuspid valves: Secondary | ICD-10-CM | POA: Diagnosis not present

## 2017-02-09 DIAGNOSIS — I480 Paroxysmal atrial fibrillation: Secondary | ICD-10-CM | POA: Insufficient documentation

## 2017-02-09 DIAGNOSIS — I10 Essential (primary) hypertension: Secondary | ICD-10-CM | POA: Diagnosis not present

## 2017-02-09 NOTE — Progress Notes (Signed)
  Echocardiogram 2D Echocardiogram has been performed.  Tye SavoyCasey N Sylena Lotter 02/09/2017, 1:54 PM

## 2017-02-22 ENCOUNTER — Other Ambulatory Visit (HOSPITAL_COMMUNITY): Payer: Self-pay | Admitting: Nurse Practitioner

## 2017-02-22 ENCOUNTER — Other Ambulatory Visit (HOSPITAL_COMMUNITY): Payer: Medicare Other

## 2017-02-22 NOTE — Addendum Note (Signed)
Encounter addended by: Newman Niparroll, Jerelyn Trimarco C, NP on: 02/22/2017  1:33 PM<BR>    Actions taken: Sign clinical note

## 2017-02-23 ENCOUNTER — Ambulatory Visit (INDEPENDENT_AMBULATORY_CARE_PROVIDER_SITE_OTHER): Payer: Medicare Other | Admitting: Cardiology

## 2017-02-23 ENCOUNTER — Encounter: Payer: Self-pay | Admitting: Cardiology

## 2017-02-23 VITALS — BP 167/84 | HR 62 | Ht 63.0 in | Wt 105.0 lb

## 2017-02-23 DIAGNOSIS — I480 Paroxysmal atrial fibrillation: Secondary | ICD-10-CM

## 2017-02-23 DIAGNOSIS — I10 Essential (primary) hypertension: Secondary | ICD-10-CM

## 2017-02-23 NOTE — Patient Instructions (Addendum)
Medication Instructions:    Your physician recommends that you continue on your current medications as directed. Please refer to the Current Medication list given to you today.  - If you need a refill on your cardiac medications before your next appointment, please call your pharmacy.   Labwork:  None ordered  Testing/Procedures:  None ordered  Follow-Up:  Your physician recommends that you schedule a follow-up appointment in: 3 months with Dr. Camnitz.  Thank you for choosing [facility]!!   Sherri Price, RN [phone]  Any Other Special Instructions Will Be Listed Below (If Applicable).       

## 2017-02-23 NOTE — Progress Notes (Signed)
Electrophysiology Office Note   Date:  02/23/2017   ID:  Jodi GamblesMargaret Snyder, DOB May 10, 1932, MRN [id number]  PCP:  Elspeth Choerrell, Grace E., MD  Cardiologist:  none Primary Electrophysiologist:  Marrissa Dai Jorja LoaMartin Tanis Hensarling, MD    Chief Complaint  Patient presents with  . New Patient (Initial Visit)    PAF     History of Present Illness: Jodi Snyder is a 83 y.o. female who is being seen today for the evaluation of atrial fibrillation at the request of Elspeth Choerrell, Grace E., MD. Presenting today for electrophysiology evaluation. History of HTN and polymyalgia. She presented to the ER on 6/2 with palpitations, lightheadedness, SOB. Found to be in AF with RVR with rates of 160 bpm. Successfully cardioverted. Recently had prednisone increased for polymyalgia. Was placed on Eliquis. She has diltiazem as needed for AF but is not on chronic medications.    Today, she denies symptoms of palpitations, chest pain, shortness of breath, orthopnea, PND, lower extremity edema, claudication, dizziness, presyncope, syncope, bleeding, or neurologic sequela. The patient is tolerating medications without difficulties.    Past Medical History:  Diagnosis Date  . Hypertension   . Immune system disorder (HCC)   . Polymyalgia (HCC)    Past Surgical History:  Procedure Laterality Date  . ABDOMINAL HYSTERECTOMY    . BLADDER SURGERY    . OOPHORECTOMY       Current Outpatient Prescriptions  Medication Sig Dispense Refill  . ALPRAZolam (XANAX) 0.25 MG tablet Take 0.25 mg by mouth 2 (two) times daily.     . bimatoprost (LUMIGAN) 0.03 % ophthalmic solution Place 1 drop into both eyes at bedtime.     . Biotin (BIOTIN 5000) 5 MG CAPS Take 5 mg by mouth daily.    . brinzolamide (AZOPT) 1 % ophthalmic suspension Place 1 drop into the right eye 3 (three) times daily.     . Carboxymethylcell-Hypromellose (GENTEAL OP) Place 1 application into both eyes at bedtime.    . cycloSPORINE (RESTASIS) 0.05 % ophthalmic emulsion Place  1 drop into both eyes 2 (two) times daily.     Marland Kitchen. diltiazem (CARDIZEM) 30 MG tablet Take 1 tablet every 4 hours AS NEEDED for afib heart rate >100 45 tablet 1  . ELIQUIS 2.5 MG TABS tablet TAKE 1 TABLET BY MOUTH TWICE DAILY 60 tablet 6  . esomeprazole (NEXIUM) 40 MG capsule Take 40 mg by mouth every morning.    . hydroxychloroquine (PLAQUENIL) 200 MG tablet Take 200 mg by mouth daily.    . Hypromellose (ARTIFICIAL TEARS OP) Place 1 drop into both eyes daily as needed (dry eyes).    Marland Kitchen. L-LYSINE PO Take 2 tablets by mouth daily.    Marland Kitchen. lisinopril (PRINIVIL,ZESTRIL) 20 MG tablet Take 20 mg by mouth daily.     . Multiple Vitamins-Minerals (ICAPS AREDS 2 PO) Take 2 capsules by mouth 2 (two) times daily.    . predniSONE (DELTASONE) 1 MG tablet Take 6 mg by mouth daily with breakfast.     . Probiotic Product (PROBIOTIC PO) Take 1 tablet by mouth 2 (two) times daily.    . traMADol-acetaminophen (ULTRACET) 37.5-325 MG tablet Take 1 tablet by mouth 2 (two) times daily.      No current facility-administered medications for this visit.     Allergies:   Penicillins and Sulfa antibiotics   Social History:  The patient  reports that she has quit smoking. She has never used smokeless tobacco. She reports that she does not drink alcohol or use drugs.  Family History:  The patient's family history includes Anuerysm in her father; Heart attack in her mother; Stroke in her father.    ROS:  Please see the history of present illness.   Otherwise, review of systems is positive for none.   All other systems are reviewed and negative.    PHYSICAL EXAM: VS:  BP (!) 167/84 (BP Location: Right Arm)   Pulse 62   Ht 5\' 3"  (1.6 m)   Wt 105 lb (47.6 kg)   BMI 18.60 kg/m  , BMI Body mass index is 18.6 kg/m. GEN: Well nourished, well developed, in no acute distress  HEENT: normal  Neck: no JVD, carotid bruits, or masses Cardiac: RRR; no murmurs, rubs, or gallops,no edema  Respiratory:  clear to auscultation  bilaterally, normal work of breathing GI: soft, nontender, nondistended, + BS MS: no deformity or atrophy  Skin: warm and dry Neuro:  Strength and sensation are intact Psych: euthymic mood, full affect  EKG:  EKG is not ordered today. Personal review of the ekg ordered 02/07/17 shows sinus rhythm, rate 70  Recent Labs: 02/03/2017: BUN 25; Creatinine, Ser 1.03; Hemoglobin 14.2; Magnesium 2.3; Platelets 183; Potassium 3.5; Sodium 143    Lipid Panel  No results found for: CHOL, TRIG, HDL, CHOLHDL, VLDL, LDLCALC, LDLDIRECT   Wt Readings from Last 3 Encounters:  02/23/17 105 lb (47.6 kg)  02/07/17 104 lb (47.2 kg)  02/03/17 107 lb (48.5 kg)      Other studies Reviewed: Additional studies/ records that were reviewed today include: TTE 02/09/17  Review of the above records today demonstrates:  - Left ventricle: The cavity size was normal. Wall thickness was   normal. Systolic function was normal. The estimated ejection   fraction was in the range of 60% to 65%. Wall motion was normal;   there were no regional wall motion abnormalities. Features are   consistent with a pseudonormal left ventricular filling pattern,   with concomitant abnormal relaxation and increased filling   pressure (grade 2 diastolic dysfunction). - Aortic valve: Mildly calcified annulus. - Mitral valve: There was mild regurgitation. - Pulmonary arteries: Systolic pressure was mildly increased. PA   peak pressure: 32 mm Hg (S).   ASSESSMENT AND PLAN:  1.  Paroxysmal atrial fibrillation: on Eliquis, PRN diltiazem. She is in sinus rhythm today. Her TTE shows a normal LA and normal LVEF. She is on PRN diltiazem and has not had to take a dose. Krrish Freund continue that management unless she has more episode of AF and would then require antiarrhythmic therapy.  This patients CHA2DS2-VASc Score and unadjusted Ischemic Stroke Rate (% per year) is equal to 4.8 % stroke rate/year from a score of 4  Above score calculated as  1 point each if present [CHF, HTN, DM, Vascular=MI/PAD/Aortic Plaque, Age if 65-74, or Female] Above score calculated as 2 points each if present [Age > 75, or Stroke/TIA/TE]   2. Hypertension: BP elevated today. She says that it is normal at home. Yenesis Even not adjust medications at this time.    Current medicines are reviewed at length with the patient today.   The patient does not have concerns regarding her medicines.  The following changes were made today:  none  Labs/ tests ordered today include:  No orders of the defined types were placed in this encounter.    Disposition:   FU with Bonniejean Piano 3 months  Signed, Cornie Herrington Jorja Loa, MD  02/23/2017 2:12 PM     [facility] [address] [place] [address] [phone] (office) [phone] (fax)

## 2017-06-08 ENCOUNTER — Ambulatory Visit (INDEPENDENT_AMBULATORY_CARE_PROVIDER_SITE_OTHER): Payer: Medicare Other | Admitting: Cardiology

## 2017-06-08 ENCOUNTER — Encounter: Payer: Self-pay | Admitting: Cardiology

## 2017-06-08 VITALS — BP 167/91 | HR 68 | Ht 63.0 in | Wt 109.1 lb

## 2017-06-08 DIAGNOSIS — I10 Essential (primary) hypertension: Secondary | ICD-10-CM | POA: Diagnosis not present

## 2017-06-08 DIAGNOSIS — I480 Paroxysmal atrial fibrillation: Secondary | ICD-10-CM

## 2017-06-08 NOTE — Progress Notes (Signed)
Electrophysiology Office Note   Date:  06/08/2017   ID:  Jodi Snyder, DOB February 09, 1932, MRN [id number]  PCP:  Elspeth Cho., MD  Cardiologist:  none Primary Electrophysiologist:  Matsuko Kretz Jorja Loa, MD    Chief Complaint  Patient presents with  . Atrial Fibrillation     History of Present Illness: Jodi Snyder is a 83 y.o. female who is being seen today for the evaluation of atrial fibrillation at the request of Elspeth Cho., MD. Presenting today for electrophysiology evaluation. History of HTN and polymyalgia. She presented to the ER on 6/2 with palpitations, lightheadedness, SOB. Found to be in AF with RVR with rates of 160 bpm. Successfully cardioverted. Recently had prednisone increased for polymyalgia. Was placed on Eliquis. She has diltiazem as needed for AF but is not on chronic medications.  Today, denies symptoms of palpitations, chest pain, shortness of breath, orthopnea, PND, lower extremity edema, claudication, dizziness, presyncope, syncope, bleeding, or neurologic sequela. The patient is tolerating medications without difficulties. She is feeling well without major complaint. She has felt poorly over the last month due to a urinary tract infection. She did not go into atrial fibrillation during that time. She is otherwise able to do all of her daily activities. Her blood pressure is elevated today and she says that this is likely due to white coat hypertension. She brought in records of her blood pressures which are consistently in the 120s to 130s at home.    Past Medical History:  Diagnosis Date  . Hypertension   . Immune system disorder (HCC)   . Polymyalgia (HCC)    Past Surgical History:  Procedure Laterality Date  . ABDOMINAL HYSTERECTOMY    . BLADDER SURGERY    . OOPHORECTOMY       Current Outpatient Prescriptions  Medication Sig Dispense Refill  . ALPRAZolam (XANAX) 0.25 MG tablet Take 0.25 mg by mouth 2 (two) times daily.     . bimatoprost  (LUMIGAN) 0.03 % ophthalmic solution Place 1 drop into both eyes at bedtime.     . Biotin (BIOTIN 5000) 5 MG CAPS Take 5 mg by mouth daily.    . brinzolamide (AZOPT) 1 % ophthalmic suspension Place 1 drop into the right eye 3 (three) times daily.     . Carboxymethylcell-Hypromellose (GENTEAL OP) Place 1 application into both eyes at bedtime.    . cycloSPORINE (RESTASIS) 0.05 % ophthalmic emulsion Place 1 drop into both eyes 2 (two) times daily.     Marland Kitchen diltiazem (CARDIZEM) 30 MG tablet Take 1 tablet every 4 hours AS NEEDED for afib heart rate >100 45 tablet 1  . ELIQUIS 2.5 MG TABS tablet TAKE 1 TABLET BY MOUTH TWICE DAILY 60 tablet 6  . esomeprazole (NEXIUM) 40 MG capsule Take 40 mg by mouth every morning.    . hydroxychloroquine (PLAQUENIL) 200 MG tablet Take 200 mg by mouth daily.    . Hypromellose (ARTIFICIAL TEARS OP) Place 1 drop into both eyes daily as needed (dry eyes).    Marland Kitchen L-LYSINE PO Take 2 tablets by mouth daily.    Marland Kitchen lisinopril (PRINIVIL,ZESTRIL) 20 MG tablet Take 20 mg by mouth daily.     . Multiple Vitamins-Minerals (ICAPS AREDS 2 PO) Take 2 capsules by mouth 2 (two) times daily.    . predniSONE (DELTASONE) 1 MG tablet Take 6 mg by mouth daily with breakfast.     . Probiotic Product (PROBIOTIC PO) Take 1 tablet by mouth 2 (two) times daily.    Marland Kitchen  traMADol-acetaminophen (ULTRACET) 37.5-325 MG tablet Take 1 tablet by mouth 2 (two) times daily.      No current facility-administered medications for this visit.     Allergies:   Penicillins; Brimonidine; Penicillin g; Timolol maleate; Sulfa antibiotics; and Sulfasalazine   Social History:  The patient  reports that she has quit smoking. She has never used smokeless tobacco. She reports that she does not drink alcohol or use drugs.   Family History:  The patient's family history includes Anuerysm in her father; Heart attack in her mother; Stroke in her father.    ROS:  Please see the history of present illness.   Otherwise, review of  systems is positive for none.   All other systems are reviewed and negative.   PHYSICAL EXAM: VS:  BP (!) 167/91   Pulse 68   Ht 5\' 3"  (1.6 m)   Wt 109 lb 1.9 oz (49.5 kg)   BMI 19.33 kg/m  , BMI Body mass index is 19.33 kg/m. GEN: Well nourished, well developed, in no acute distress  HEENT: normal  Neck: no JVD, carotid bruits, or masses Cardiac: RRR; no murmurs, rubs, or gallops,no edema  Respiratory:  clear to auscultation bilaterally, normal work of breathing GI: soft, nontender, nondistended, + BS MS: no deformity or atrophy  Skin: warm and dry Neuro:  Strength and sensation are intact Psych: euthymic mood, full affect  EKG:  EKG is not ordered today. Personal review of the ekg ordered 02/07/17 shows SR, rate 70   Recent Labs: 02/03/2017: BUN 25; Creatinine, Ser 1.03; Hemoglobin 14.2; Magnesium 2.3; Platelets 183; Potassium 3.5; Sodium 143    Lipid Panel  No results found for: CHOL, TRIG, HDL, CHOLHDL, VLDL, LDLCALC, LDLDIRECT   Wt Readings from Last 3 Encounters:  06/08/17 109 lb 1.9 oz (49.5 kg)  02/23/17 105 lb (47.6 kg)  02/07/17 104 lb (47.2 kg)      Other studies Reviewed: Additional studies/ records that were reviewed today include: TTE 02/09/17  Review of the above records today demonstrates:  - Left ventricle: The cavity size was normal. Wall thickness was   normal. Systolic function was normal. The estimated ejection   fraction was in the range of 60% to 65%. Wall motion was normal;   there were no regional wall motion abnormalities. Features are   consistent with a pseudonormal left ventricular filling pattern,   with concomitant abnormal relaxation and increased filling   pressure (grade 2 diastolic dysfunction). - Aortic valve: Mildly calcified annulus. - Mitral valve: There was mild regurgitation. - Pulmonary arteries: Systolic pressure was mildly increased. PA   peak pressure: 32 mm Hg (S).   ASSESSMENT AND PLAN:  1.  Paroxysmal atrial  fibrillation: On Eliquis and when necessary diltiazem. Has not had episodes of atrial fibrillation despite urinary tract infection. Continue with current management.  This patients CHA2DS2-VASc Score and unadjusted Ischemic Stroke Rate (% per year) is equal to 4.8 % stroke rate/year from a score of 4  Above score calculated as 1 point each if present [CHF, HTN, DM, Vascular=MI/PAD/Aortic Plaque, Age if 65-74, or Female] Above score calculated as 2 points each if present [Age > 75, or Stroke/TIA/TE]  2. Hypertension: Blood pressure elevated today but normal at home. She says that she has been diagnosed with white coat hypertension. No changes at this time as she is brought in blood pressures at home that are acceptable.    Current medicines are reviewed at length with the patient today.   The  patient does not have concerns regarding her medicines.  The following changes were made today:  None  Labs/ tests ordered today include:  No orders of the defined types were placed in this encounter.    Disposition:   FU with Anshul Meddings 6 months  Signed, Exavior Kimmons Jorja LoaMartin Demitra Danley, MD  06/08/2017 2:01 PM     [facility] [address] [place] [address] [phone] (office) [phone] (fax)

## 2017-06-08 NOTE — Patient Instructions (Signed)
Medication Instructions:    Your physician recommends that you continue on your current medications as directed. Please refer to the Current Medication list given to you today.  Labwork:  None ordered  Testing/Procedures:  None ordered  Follow-Up:  Your physician wants you to follow-up in: 6 months with Dr. Camnitz.  You will receive a reminder letter in the mail two months in advance. If you don't receive a letter, please call our office to schedule the follow-up appointment.  - If you need a refill on your cardiac medications before your next appointment, please call your pharmacy.    Thank you for choosing [facility]!!   Lujean Ebright, RN [phone]         

## 2017-09-15 ENCOUNTER — Other Ambulatory Visit (HOSPITAL_COMMUNITY): Payer: Self-pay | Admitting: Nurse Practitioner

## 2017-09-15 NOTE — Telephone Encounter (Signed)
Pt last saw Dr Elberta Fortisamnitz 06/08/17, last labs 02/03/17 Creat 1.03, age 84, weight 49.5kg, based on specified criteria pt is on appropriate dosage of Eliquis 2.5mg  BID.  Will refill rx.

## 2017-10-08 DIAGNOSIS — M4135 Thoracogenic scoliosis, thoracolumbar region: Secondary | ICD-10-CM | POA: Insufficient documentation

## 2017-10-18 DIAGNOSIS — M419 Scoliosis, unspecified: Secondary | ICD-10-CM | POA: Insufficient documentation

## 2017-12-14 ENCOUNTER — Encounter: Payer: Self-pay | Admitting: Cardiology

## 2017-12-14 ENCOUNTER — Ambulatory Visit: Payer: Medicare Other | Admitting: Cardiology

## 2017-12-14 VITALS — BP 190/95 | HR 69 | Ht 63.0 in | Wt 104.1 lb

## 2017-12-14 DIAGNOSIS — I10 Essential (primary) hypertension: Secondary | ICD-10-CM

## 2017-12-14 DIAGNOSIS — I480 Paroxysmal atrial fibrillation: Secondary | ICD-10-CM | POA: Diagnosis not present

## 2017-12-14 MED ORDER — LISINOPRIL 40 MG PO TABS: 40.0000 mg | ORAL_TABLET | Freq: Every day | ORAL | 1 refills | Status: DC

## 2017-12-14 NOTE — Patient Instructions (Addendum)
Medication Instructions:  Your physician has recommended you make the following change in your medication:  1. INCREASE Lisinopril to 40 mg daily  (new prescription for 40 mg tablets sent to pharmacy)   Labwork: None ordered  Testing/Procedures: None ordered  Follow-Up: Your physician wants you to follow-up in: 6 months with Dr. Elberta Fortis.  You will receive a reminder letter in the mail two months in advanearce. If you don't receive a letter, please call our office to schedule the follow-up appointment.  * If you need a refill on your cardiac medications before your next appointment, please call your pharmacy.   *Please note that any paperwork needing to be filled out by the provider will need to be addressed at the front desk prior to seeing the provider. Please note that any FMLA, disability or other documents regarding health condition is subject to a $25.00 charge that must be received prior to completion of paperwork in the form of a money order or check.  Thank you for choosing [facility]!!   Dory Horn, RN [phone]  Any Other Special Instructions Will Be Listed Below (If Applicable). Please keep track of your blood pressure.  If it starts to go to high (top number greater than 140s) please call you primary doctor or our office to discuss.

## 2017-12-14 NOTE — Progress Notes (Signed)
Electrophysiology Office Note   Date:  12/14/2017   ID:  Rafeef Lau, DOB 10-Jun-1932, MRN [id number]  PCP:  Elspeth Cho., MD  Cardiologist:  none Primary Electrophysiologist:  Marlyn Tondreau Jorja Loa, MD    Chief Complaint  Patient presents with  . Follow-up    PAF     History of Present Illness: Jodi Snyder is a 84 y.o. female who is being seen today for the evaluation of atrial fibrillation at the request of Elspeth Cho., MD. Presenting today for electrophysiology evaluation. History of HTN and polymyalgia. She presented to the ER on 6/2 with palpitations, lightheadedness, SOB. Found to be in AF with RVR with rates of 160 bpm. Successfully cardioverted. Recently had prednisone increased for polymyalgia. Was placed on Eliquis. She has diltiazem as needed for AF but is not on chronic medications.  Today, denies symptoms of palpitations, chest pain, shortness of breath, orthopnea, PND, lower extremity edema, claudication, dizziness, presyncope, syncope, bleeding, or neurologic sequela. The patient is tolerating medications without difficulties.  She has not noted any further episodes of atrial fibrillation with rapid rates.  Overall, she has been doing well without major complaint.  Her blood pressure is quite elevated today.  On recheck it was still in the high 180s over 90.  She says that this is due to whitecoat hypertension.  She brings in blood pressure log which shows normal blood pressures at home.  She does not have chest pain, headaches, or blurred vision.   Past Medical History:  Diagnosis Date  . Hypertension   . Immune system disorder (HCC)   . Polymyalgia (HCC)    Past Surgical History:  Procedure Laterality Date  . ABDOMINAL HYSTERECTOMY    . BLADDER SURGERY    . OOPHORECTOMY       Current Outpatient Medications  Medication Sig Dispense Refill  . ALPRAZolam (XANAX) 0.25 MG tablet Take 0.25 mg by mouth 2 (two) times daily.     . bimatoprost (LUMIGAN)  0.03 % ophthalmic solution Place 1 drop into both eyes at bedtime.     . Biotin (BIOTIN 5000) 5 MG CAPS Take 5 mg by mouth daily.    . brinzolamide (AZOPT) 1 % ophthalmic suspension Place 1 drop into the right eye 3 (three) times daily.     . cycloSPORINE (RESTASIS) 0.05 % ophthalmic emulsion Place 1 drop into both eyes 2 (two) times daily.     Marland Kitchen diltiazem (CARDIZEM) 30 MG tablet Take 1 tablet every 4 hours AS NEEDED for afib heart rate >100 45 tablet 1  . ELIQUIS 2.5 MG TABS tablet TAKE 1 TABLET BY MOUTH TWICE DAILY 60 tablet 5  . esomeprazole (NEXIUM) 40 MG capsule Take 40 mg by mouth every morning.    . hydroxychloroquine (PLAQUENIL) 200 MG tablet Take 200 mg by mouth daily.    . Hypromellose (ARTIFICIAL TEARS OP) Place 1 drop into both eyes daily as needed (dry eyes).    Marland Kitchen L-LYSINE PO Take 2 tablets by mouth daily.    . Multiple Vitamins-Minerals (ICAPS AREDS 2 PO) Take 2 capsules by mouth 2 (two) times daily.    . predniSONE (DELTASONE) 1 MG tablet Take 6 mg by mouth daily with breakfast.     . traMADol-acetaminophen (ULTRACET) 37.5-325 MG tablet Take 1 tablet by mouth 2 (two) times daily.     Marland Kitchen lisinopril (PRINIVIL,ZESTRIL) 40 MG tablet Take 1 tablet (40 mg total) by mouth daily. 90 tablet 1   No current facility-administered medications for this  visit.     Allergies:   Penicillins; Brimonidine; Penicillin g; Timolol maleate; Sulfa antibiotics; and Sulfasalazine   Social History:  The patient  reports that she has quit smoking. She has never used smokeless tobacco. She reports that she does not drink alcohol or use drugs.   Family History:  The patient's family history includes Anuerysm in her father; Heart attack in her mother; Stroke in her father.    ROS:  Please see the history of present illness.   Otherwise, review of systems is positive for none.   All other systems are reviewed and negative.   PHYSICAL EXAM: VS:  BP (!) 190/95   Pulse 69   Ht  (1.6 m)   Wt 104 lb 1.9  oz (47.2 kg)   SpO2 100%   BMI 18.44 kg/m  , BMI Body mass index is 18.44 kg/m. GEN: Well nourished, well developed, in no acute distress  HEENT: normal  Neck: no JVD, carotid bruits, or masses Cardiac: RRR; no murmurs, rubs, or gallops,no edema  Respiratory:  clear to auscultation bilaterally, normal work of breathing GI: soft, nontender, nondistended, + BS MS: no deformity or atrophy  Skin: warm and dry Neuro:  Strength and sensation are intact Psych: euthymic mood, full affect  EKG:  EKG is ordered today. Personal review of the ekg ordered shows sinus rhythm, rate 66   Recent Labs: 02/03/2017: BUN 25; Creatinine, Ser 1.03; Hemoglobin 14.2; Magnesium 2.3; Platelets 183; Potassium 3.5; Sodium 143    Lipid Panel  No results found for: CHOL, TRIG, HDL, CHOLHDL, VLDL, LDLCALC, LDLDIRECT   Wt Readings from Last 3 Encounters:  12/14/17 104 lb 1.9 oz (47.2 kg)  06/08/17 109 lb 1.9 oz (49.5 kg)  02/23/17 105 lb (47.6 kg)      Other studies Reviewed: Additional studies/ records that were reviewed today include: TTE 02/09/17  Review of the above records today demonstrates:  - Left ventricle: The cavity size was normal. Wall thickness was   normal. Systolic function was normal. The estimated ejection   fraction was in the range of 60% to 65%. Wall motion was normal;   there were no regional wall motion abnormalities. Features are   consistent with a pseudonormal left ventricular filling pattern,   with concomitant abnormal relaxation and increased filling   pressure (grade 2 diastolic dysfunction). - Aortic valve: Mildly calcified annulus. - Mitral valve: There was mild regurgitation. - Pulmonary arteries: Systolic pressure was mildly increased. PA   peak pressure: 32 mm Hg (S).   ASSESSMENT AND PLAN:  1.  Paroxysmal atrial fibrillation: On Eliquis and when necessary diltiazem. Has not had episodes of atrial fibrillation despite urinary tract infection. Continue with current  management.  This patients CHA2DS2-VASc Score and unadjusted Ischemic Stroke Rate (% per year) is equal to 4.8 % stroke rate/year from a score of 4  Above score calculated as 1 point each if present [CHF, HTN, DM, Vascular=MI/PAD/Aortic Plaque, Age if 65-74, or Female] Above score calculated as 2 points each if present [Age > 75, or Stroke/TIA/TE]   2. Hypertension: Significantly elevated today in clinic, but normal at home.  Due to her likely whitecoat hypertension, Jodi Snyder increase her lisinopril to 40 mg.  We Jodi Snyder get her close follow-up with her primary physician.    Current medicines are reviewed at length with the patient today.   The patient does not have concerns regarding her medicines.  The following changes were made today:  Increase lisinopril  Labs/ tests  ordered today include:  Orders Placed This Encounter  Procedures  . EKG 12-Lead     Disposition:   FU with Kira Hartl 6 months  Signed, Colen Eltzroth Jorja Loa, MD  12/14/2017 2:27 PM     [facility] [address] [place] [address] [phone] (office) [phone] (fax)

## 2017-12-22 ENCOUNTER — Telehealth: Payer: Self-pay | Admitting: Cardiology

## 2017-12-22 NOTE — Telephone Encounter (Signed)
Spoke with patient who was informed that she should call if hypertensive.   She was recently doubled on her Lisinopril  to .    Her BP on 5/5 was 158/?; 5/8 evening 162/84; 5/9 148/81 and 145/76.  She said that Sherri told her to call with readings.

## 2017-12-22 NOTE — Telephone Encounter (Signed)
New Message   Pt c/o BP issue:  1. What are your last 5 BP readings? Last night 162/84 today she checked it twice 148/81 and 145/76 2. Are you having any other symptoms (ex. Dizziness, headache, blurred vision, passed out)? No symptoms just a light lightheadness feeling but it passed 3. What is your medication issue? No  Patient states that she was instructed to call whenever her BP went over the 140's because when she was in the office it was in the 200's/100's.

## 2017-12-26 NOTE — Telephone Encounter (Signed)
Followed up with patient. Pt reports some of her elevated numbers d/t back issues/pain.  She remembers one time taken SBP 158, but she had the worst back ache at the time and was in a back brace. Another time it was elevated when she was doing housework. Reports last night BP was 152/58. D/t recent back issues, that have now resolved, pt is going to keep track of BPs and call if SBP consistently remains > 140s. She appreciates the follow up and will be sure to call if remains elevated.

## 2018-01-05 DIAGNOSIS — I480 Paroxysmal atrial fibrillation: Secondary | ICD-10-CM | POA: Insufficient documentation

## 2018-01-05 DIAGNOSIS — R3121 Asymptomatic microscopic hematuria: Secondary | ICD-10-CM | POA: Insufficient documentation

## 2018-03-18 ENCOUNTER — Other Ambulatory Visit (HOSPITAL_COMMUNITY): Payer: Self-pay | Admitting: Cardiology

## 2018-03-20 NOTE — Telephone Encounter (Signed)
Pt is a 84 yr old female who saw Dr Elberta Fortisamnitz on 12/14/17. Weight on that visit was 47.2Kg. Last noted SCr on 02/03/17 was 1.03. Will refill Eliquis 2.5mg  BID.

## 2018-06-12 ENCOUNTER — Ambulatory Visit: Payer: Medicare Other | Admitting: Cardiology

## 2018-06-12 ENCOUNTER — Encounter: Payer: Self-pay | Admitting: Cardiology

## 2018-06-12 VITALS — BP 156/92 | HR 67 | Ht 63.0 in | Wt 105.0 lb

## 2018-06-12 DIAGNOSIS — I480 Paroxysmal atrial fibrillation: Secondary | ICD-10-CM

## 2018-06-12 DIAGNOSIS — I10 Essential (primary) hypertension: Secondary | ICD-10-CM

## 2018-06-12 MED ORDER — AMLODIPINE BESYLATE 5 MG PO TABS: 5.0000 mg | ORAL_TABLET | Freq: Every day | ORAL | 2 refills | Status: DC

## 2018-06-12 NOTE — Patient Instructions (Addendum)
Medication Instructions:  Your physician has recommended you make the following change in your medication:  1. START Norvasc (Amlodipine) 5 mg once daily  * If you need a refill on your cardiac medications before your next appointment, please call your pharmacy.   Labwork: None ordered  Testing/Procedures: None ordered  Follow-Up: Your physician wants you to follow-up in: 6 months with Dr. Elberta Fortis.  You will receive a reminder letter in the mail two months in advance. If you don't receive a letter, please call our office to schedule the follow-up appointment.   Thank you for choosing [facility]!!   Dory Horn, RN [phone]  Any Other Special Instructions Will Be Listed Below (If Applicable).  Amlodipine tablets What is this medicine? AMLODIPINE (am LOE di peen) is a calcium-channel blocker. It affects the amount of calcium found in your heart and muscle cells. This relaxes your blood vessels, which can reduce the amount of work the heart has to do. This medicine is used to lower high blood pressure. It is also used to prevent chest pain. This medicine may be used for other purposes; ask your health care provider or pharmacist if you have questions. COMMON BRAND NAME(S): Norvasc What should I tell my health care provider before I take this medicine? They need to know if you have any of these conditions: -heart problems like heart failure or aortic stenosis -liver disease -an unusual or allergic reaction to amlodipine, other medicines, foods, dyes, or preservatives -pregnant or trying to get pregnant -breast-feeding How should I use this medicine? Take this medicine by mouth with a glass of water. Follow the directions on the prescription label. Take your medicine at regular intervals. Do not take more medicine than directed. Talk to your pediatrician regarding the use of this medicine in children. Special care may be needed. This medicine has been used in children as  young as 6. Persons over 69 years old may have a stronger reaction to this medicine and need smaller doses. Overdosage: If you think you have taken too much of this medicine contact a poison control center or emergency room at once. NOTE: This medicine is only for you. Do not share this medicine with others. What if I miss a dose? If you miss a dose, take it as soon as you can. If it is almost time for your next dose, take only that dose. Do not take double or extra doses. What may interact with this medicine? -herbal or dietary supplements -local or general anesthetics -medicines for high blood pressure -medicines for prostate problems -rifampin This list may not describe all possible interactions. Give your health care provider a list of all the medicines, herbs, non-prescription drugs, or dietary supplements you use. Also tell them if you smoke, drink alcohol, or use illegal drugs. Some items may interact with your medicine. What should I watch for while using this medicine? Visit your doctor or health care professional for regular check ups. Check your blood pressure and pulse rate regularly. Ask your health care professional what your blood pressure and pulse rate should be, and when you should contact him or her. This medicine may make you feel confused, dizzy or lightheaded. Do not drive, use machinery, or do anything that needs mental alertness until you know how this medicine affects you. To reduce the risk of dizzy or fainting spells, do not sit or stand up quickly, especially if you are an older patient. Avoid alcoholic drinks; they can make you more dizzy. Do  not suddenly stop taking amlodipine. Ask your doctor or health care professional how you can gradually reduce the dose. What side effects may I notice from receiving this medicine? Side effects that you should report to your doctor or health care professional as soon as possible: -allergic reactions like skin rash, itching or  hives, swelling of the face, lips, or tongue -breathing problems -changes in vision or hearing -chest pain -fast, irregular heartbeat -swelling of legs or ankles Side effects that usually do not require medical attention (report to your doctor or health care professional if they continue or are bothersome): -dry mouth -facial flushing -nausea, vomiting -stomach gas, pain -tired, weak -trouble sleeping This list may not describe all possible side effects. Call your doctor for medical advice about side effects. You may report side effects to FDA at 1-800-FDA-1088. Where should I keep my medicine? Keep out of the reach of children. Store at room temperature between 59 and 86 degrees F (15 and 30 degrees C). Protect from light. Keep container tightly closed. Throw away any unused medicine after the expiration date. NOTE: This sheet is a summary. It may not cover all possible information. If you have questions about this medicine, talk to your doctor, pharmacist, or health care provider.  2018 Elsevier/Gold Standard (2012-06-30 11:40:58)
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
## 2018-07-20 ENCOUNTER — Telehealth: Payer: Self-pay | Admitting: *Deleted

## 2018-07-20 NOTE — Telephone Encounter (Signed)
Jodi Snyder, patient called back to say her BP is 167/87 so it is not her machine.

## 2018-07-20 NOTE — Telephone Encounter (Signed)
Returned call.  Pt reports that she is stressed and in a lot of pain.  Rheumatologist advised to start higher dose Prednisone for the inflammation but warned it can raise her BP and hers was elevated at Rheumatologist - 180/100. Home BP numbers are as follows: 12/4 135/74, HR 66 12/3 133/77, HR 68 12/2 125/67, HR 72 12/1 119/69, HR 77 11/30 133/76, HR 61 11/29 125/72, HR 77 11/28 125/70, HR 75  Pt asking if she should take her Prednisone -- advised to discuss w/ rheumatologist about their OV BP recording and pt's home BP recordings. Informed pt that if home numbers are correct then we do not need to anything and her BP is normal.   Informed that we may need to bring her in to the office to compare her home BP monitor to a manual one in office to see if her personal cuff is reading BP accurately. Pt denies any symptoms that would be r/t high BP besides occasional HA. Pt does report ankle edema since starting Amlodipine. Informed that I would forward to Dr. Elberta Fortisamnitz for advisement She understands it may be next week before advisement.

## 2018-07-20 NOTE — Telephone Encounter (Signed)
Pt just got back from Rheumatologist and BP was 180/100 and they asked her to let Dr. Elberta Fortisamnitz know to see if there is anything she needs to do. Was just rxd a prednisone dose pack, has not started it yet. Please advise.

## 2018-07-25 MED ORDER — HYDROCHLOROTHIAZIDE 25 MG PO TABS: 25.0000 mg | ORAL_TABLET | Freq: Every day | ORAL | 3 refills | Status: DC

## 2018-07-25 NOTE — Telephone Encounter (Signed)
Advised pt to stop Amlodipine and start Hydrochlorothiazide 25 mg once daily. [facility] Pt will continue to monitor and call the office if this does not work and/or SE begin after starting new medication. Pt is agreeable to plan.

## 2018-07-25 NOTE — Addendum Note (Signed)
Addended by: Baird LyonsPRICE, Makira Holleman L on: 07/25/2018 02:25 PM   Modules accepted: Orders

## 2018-08-01 ENCOUNTER — Emergency Department (HOSPITAL_COMMUNITY)
Admission: EM | Admit: 2018-08-01 | Discharge: 2018-08-02 | Disposition: A | Discharge status: Home / Self Care | Payer: Medicare Other | Attending: Emergency Medicine | Admitting: Emergency Medicine

## 2018-08-01 ENCOUNTER — Other Ambulatory Visit: Payer: Self-pay

## 2018-08-01 ENCOUNTER — Emergency Department (HOSPITAL_COMMUNITY): Payer: Medicare Other

## 2018-08-01 ENCOUNTER — Encounter (HOSPITAL_COMMUNITY): Payer: Self-pay | Admitting: Emergency Medicine

## 2018-08-01 DIAGNOSIS — N183 Chronic kidney disease, stage 3 (moderate): Secondary | ICD-10-CM | POA: Diagnosis not present

## 2018-08-01 DIAGNOSIS — S79911A Unspecified injury of right hip, initial encounter: Secondary | ICD-10-CM | POA: Diagnosis present

## 2018-08-01 DIAGNOSIS — Y9389 Activity, other specified: Secondary | ICD-10-CM | POA: Insufficient documentation

## 2018-08-01 DIAGNOSIS — Z87891 Personal history of nicotine dependence: Secondary | ICD-10-CM | POA: Diagnosis not present

## 2018-08-01 DIAGNOSIS — M79671 Pain in right foot: Secondary | ICD-10-CM | POA: Insufficient documentation

## 2018-08-01 DIAGNOSIS — W0110XA Fall on same level from slipping, tripping and stumbling with subsequent striking against unspecified object, initial encounter: Secondary | ICD-10-CM | POA: Insufficient documentation

## 2018-08-01 DIAGNOSIS — S7001XA Contusion of right hip, initial encounter: Secondary | ICD-10-CM | POA: Diagnosis not present

## 2018-08-01 DIAGNOSIS — Y92009 Unspecified place in unspecified non-institutional (private) residence as the place of occurrence of the external cause: Secondary | ICD-10-CM | POA: Insufficient documentation

## 2018-08-01 DIAGNOSIS — Y999 Unspecified external cause status: Secondary | ICD-10-CM | POA: Diagnosis not present

## 2018-08-01 DIAGNOSIS — Z7901 Long term (current) use of anticoagulants: Secondary | ICD-10-CM | POA: Insufficient documentation

## 2018-08-01 DIAGNOSIS — I129 Hypertensive chronic kidney disease with stage 1 through stage 4 chronic kidney disease, or unspecified chronic kidney disease: Secondary | ICD-10-CM | POA: Insufficient documentation

## 2018-08-01 DIAGNOSIS — Z79899 Other long term (current) drug therapy: Secondary | ICD-10-CM | POA: Insufficient documentation

## 2018-08-01 DIAGNOSIS — W19XXXA Unspecified fall, initial encounter: Secondary | ICD-10-CM

## 2018-08-01 NOTE — ED Provider Notes (Signed)
Medical screening examination/treatment/procedure(s) were conducted as a shared visit with non-physician practitioner(s) and myself.  I personally evaluated the patient during the encounter.  Here with what sounds like a mechanical fall on blood thinners.  Is having pain in her right hip and right foot.  Some muscle pain elsewhere but she blames this on her polymyalgia.  No high risk features for possible syncope.  No obvious wounds that need to be repaired.  Neuro exam is otherwise intact.  Musculoskeletal exam does not reveal any other tenderness elsewhere. Plan for imaging of affected body parts.    Wynn Alldredge, Barbara CowerJason, MD 08/02/18 [phone]

## 2018-08-01 NOTE — ED Notes (Signed)
Patient transported to X-ray 

## 2018-08-01 NOTE — ED Triage Notes (Signed)
Pt arrives to ED from home with complaints of mechanical fall tonight. EMS reports pt tripped over her feet and fell on her floor. Pt stated she hit her head on her carpeted floor. She denies LOC. Pt takes eliquis daily at 9pm but did not take it tonight due to being worried about bleeding from her fall. Pt states she was dx wityh polymyalgia 2 weeks atgo and was on 15mg  or prednisone but currently on 5mg  prednisone. Pt complains of right ankle and right hip pain. Pt placed in position of comfort with bed locked and lowered, call bell in reach.

## 2018-08-02 NOTE — ED Notes (Signed)
Pt ambulated well with walker and cam boot. Pt states she feels like she has more stability with the boot than the wrap. Instructions on boot given to pt and pt friend. Pt also given ace wrap

## 2018-08-02 NOTE — ED Provider Notes (Signed)
[facility] EMERGENCY DEPARTMENT Provider Note   CSN: [id number] Arrival date & time: 08/01/18  2235     History   Chief Complaint Chief Complaint  Patient presents with  . Fall    HPI Jodi Snyder is a 84 y.o. female.   84 year old female presents to the ER from home tonight after a mechanical fall.  She states that she was trying to put down the remote when she tripped over her feet and fell to the floor.  She fell primarily on her right side, now complaining of right foot and hip pain.  She did strike her head on the carpeted floor.  She had no loss of consciousness.  Denies any headache presently.  No subsequent nausea or vomiting, no extremity numbness or paresthesias, extremity weakness, bowel or bladder incontinence.  She took tramadol prior to arrival with mild symptomatic relief.  She has a history of polymyalgia for which she is on a prednisone taper.       Past Medical History:  Diagnosis Date  . Hypertension   . Immune system disorder (HCC)   . Polymyalgia [facility])     Patient Active Problem List   Diagnosis Date Noted  . Asymptomatic microscopic hematuria 01/05/2018  . Paroxysmal atrial fibrillation (HCC) 01/05/2018  . Scoliosis 10/18/2017  . Thoracogenic scoliosis of thoracolumbar region 10/08/2017  . Glaucoma 06/21/2016  . Drug therapy 01/28/2016  . Benign hypertension with CKD (chronic kidney disease) stage III (HCC) 01/16/2016  . Discoid lupus erythematosus 01/16/2016  . Generalized anxiety disorder 01/16/2016  . Osteoporosis 01/16/2016  . Polymyalgia rheumatica (HCC) 01/16/2016  . Closed fracture of distal end of left radius 11/11/2014  . Dry eye syndrome 08/03/2012  . Other states following surgery of eye and adnexa 04/21/2012  . 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 Low-tension glaucoma, unspecified eye, stage unspecified 04/07/2012  . Pseudophakia of both eyes 04/07/2012    Past Surgical History:  Procedure Laterality  Date  . ABDOMINAL HYSTERECTOMY    . BLADDER SURGERY    . OOPHORECTOMY       OB History   No obstetric history on file.      Home Medications    Prior to Admission medications   Medication Sig Start Date End Date Taking? Authorizing Provider  ALPRAZolam Prudy Feeler) 0.5 MG tablet Take 0.25 mg by mouth 2 (two) times daily as needed for anxiety.  04/20/18  Yes [provider]  bimatoprost (LUMIGAN) 0.03 % ophthalmic solution Place 1 drop into both eyes at bedtime.    Yes [provider]  Biotin (BIOTIN 5000) 5 MG CAPS Take 5 mg by mouth daily.   Yes [provider]  brinzolamide (AZOPT) 1 % ophthalmic suspension Place 1 drop into both eyes 3 (three) times daily.    Yes [provider]  cycloSPORINE (RESTASIS) 0.05 % ophthalmic emulsion Place 1 drop into both eyes 2 (two) times daily.    Yes [provider]  diltiazem (CARDIZEM) 30 MG tablet Take 1 tablet every 4 hours AS NEEDED for afib heart rate >100 02/07/17  Yes Newman Nip, NP  ELIQUIS 2.5 MG TABS tablet TAKE 1 TABLET BY MOUTH TWICE DAILY 03/20/18  Yes Camnitz, Will Daphine Deutscher, MD  esomeprazole (NEXIUM) 40 MG capsule Take 40 mg by mouth daily.    Yes [provider]  hydrochlorothiazide (HYDRODIURIL) 25 MG tablet Take 1 tablet (25 mg total) by mouth daily. 07/25/18  Yes Camnitz, Andree Coss, MD  hydroxychloroquine (PLAQUENIL) 200 MG tablet  Take 200 mg by mouth daily.   Yes [provider]  Hypromellose (ARTIFICIAL TEARS OP) Place 1 drop into both eyes daily as needed (dry eyes).   Yes [provider]  L-LYSINE PO Take 2 tablets by mouth daily.   Yes [provider]  Multiple Vitamins-Minerals (ICAPS AREDS 2 PO) Take 2 capsules by mouth 2 (two) times daily.   Yes [provider]  predniSONE (DELTASONE) 5 MG tablet Take 5 mg by mouth daily.  01/13/18  Yes [provider]  traMADol-acetaminophen (ULTRACET) 37.5-325 MG tablet Take 1 tablet by mouth 2  (two) times daily.    Yes [provider]    Family History Family History  Problem Relation Age of Onset  . Heart attack Mother   . Stroke Father   . Anuerysm Father     Social History Social History   Tobacco Use  . Smoking status: Former Games developer  . Smokeless tobacco: Never Used  Substance Use Topics  . Alcohol use: No  . Drug use: No     Allergies   Penicillins; Brimonidine; Penicillin g; Timolol maleate; Sulfa antibiotics; and Sulfasalazine   Review of Systems Review of Systems Ten systems reviewed and are negative for acute change, except as noted in the HPI.    Physical Exam Updated Vital Signs BP (!) 157/76 (BP Location: Right Arm)   Pulse 67   Resp 16   Ht 5\' 3"  (1.6 m)   Wt 46.3 kg   SpO2 99%   BMI 18.07 kg/m   Physical Exam Vitals signs and nursing note reviewed.  Constitutional:      General: She is not in acute distress.    Appearance: She is well-developed. She is not diaphoretic.     Comments: Nontoxic appearing and in NAD  HENT:     Head: Normocephalic and atraumatic.     Comments: No hematoma or contusion to scalp. No battle's sign or raccoon's eyes. Eyes:     General: No scleral icterus.    Conjunctiva/sclera: Conjunctivae normal.  Neck:     Musculoskeletal: Normal range of motion.     Comments: Normal ROM exhibited. Cardiovascular:     Rate and Rhythm: Normal rate and regular rhythm.     Pulses: Normal pulses.  Pulmonary:     Effort: Pulmonary effort is normal. No respiratory distress.     Comments: Respirations even and unlabored Musculoskeletal: Normal range of motion.     Comments: Full ROM of BLE. No leg shortening or malrotation. No bony deformity to R hip or R foot.  Skin:    General: Skin is warm and dry.     Coloration: Skin is not pale.     Findings: No erythema or rash.  Neurological:     Mental Status: She is alert and oriented to person, place, and time.     Comments: GCS 15. Speech is clear and goal  oriented. Moving all extremities spontaneously.  Psychiatric:        Behavior: Behavior normal.      ED Treatments / Results  Labs (all labs ordered are listed, but only abnormal results are displayed) Labs Reviewed - No data to display  EKG None  Radiology Dg Cervical Spine Complete  Result Date: 08/01/2018 CLINICAL DATA:  Neck pain after fall. EXAM: CERVICAL SPINE - COMPLETE 4+ VIEW COMPARISON:  None. FINDINGS: AP, lateral, open mouth and bilateral oblique views of the cervical spine are provided demonstrating mild disc space narrowing at C5-6 and C6-7  with facet arthropathy. Right-sided mild foraminal encroachment from facet arthropathy and uncinate spurring is seen at C5-6 on the right. No significant left-sided foraminal encroachment. No jumped or perched facets. No acute cervical spine fracture, prevertebral soft tissue swelling nor static listhesis. IMPRESSION: Degenerative disc disease at C5-6 and C6-7 with mild facet arthrosis at C5-6. No acute cervical spine fracture or static listhesis. Electronically Signed   By: Tollie Ethavid  Kwon M.D.   On: 08/01/2018 23:59   Ct Head Wo Contrast  Result Date: 08/02/2018 CLINICAL DATA:  Fall. EXAM: CT HEAD WITHOUT CONTRAST TECHNIQUE: Contiguous axial images were obtained from the base of the skull through the vertex without intravenous contrast. COMPARISON:  None. FINDINGS: Brain: No evidence of acute infarction, hemorrhage, hydrocephalus, extra-axial collection or mass lesion/mass effect. Moderate age related cerebral atrophy. Scattered mild periventricular and subcortical white matter hypodensities are nonspecific, but favored to reflect chronic microvascular ischemic changes. Vascular: Atherosclerotic vascular calcification of the carotid siphons. No hyperdense vessel. Skull: Normal. Negative for fracture or focal lesion. Sinuses/Orbits: No acute finding. Other: None. IMPRESSION: 1.  No acute intracranial abnormality. 2. Moderate age related atrophy  and mild chronic microvascular ischemic changes. Electronically Signed   By: Obie DredgeWilliam T Derry M.D.   On: 08/02/2018 00:17   Dg Foot Complete Right  Result Date: 08/01/2018 CLINICAL DATA:  Right foot pain after fall this evening. EXAM: RIGHT FOOT COMPLETE - 3+ VIEW COMPARISON:  None. FINDINGS: Osteopenic appearance of the right foot with osteoarthritis of the DIP and PIP joints of the second through fifth digits and interphalangeal joint of the great toe. Mild joint space narrowing and spurring is also noted of the first MTP articulation. Degenerative joint space narrowing of the midfoot. The tibiotalar and subtalar joints are maintained without joint effusion or dislocation. No acute fracture is visualized. No soft tissue mass or mineralization. No radiopaque foreign body. IMPRESSION: Osteoarthritis of the toes and midfoot. Osteopenia without acute fracture nor joint dislocation. Electronically Signed   By: Tollie Ethavid  Kwon M.D.   On: 08/01/2018 23:57   Dg Hip Unilat W Or Wo Pelvis 2-3 Views Right  Result Date: 08/01/2018 CLINICAL DATA:  Patient fell and has tenderness over the right hip. EXAM: DG HIP (WITH OR WITHOUT PELVIS) 2-3V RIGHT COMPARISON:  None. FINDINGS: There is mild levoconvex curvature of the included lumbar spine possibly positional in etiology with associated degenerative disc disease, eccentrically flattened on the right. No fracture of the bony pelvis. Mild degenerative joint space narrowing of both hips. No pelvic diastasis. Vascular calcifications project over the right hip. No acute fracture of either hip. The femoral heads are spherical in appearance without flattening. There are soft tissue calcifications adjacent to the greater trochanter on the right suspicious for calcific bursitis. IMPRESSION: 1. Possible greater trochanteric calcific bursitis on the right with soft tissue calcification seen adjacent to the greater trochanter. 2. Lumbar spondylosis with gentle levoconvex curvature of  the included lumbar spine. 3. No acute pelvic fracture. Degenerative joint space narrowing of both hips. Electronically Signed   By: Tollie Ethavid  Kwon M.D.   On: 08/01/2018 23:55    Procedures Procedures (including critical care time)  Medications Ordered in ED Medications - No data to display   2:27 AM Ambulated in the ED with walker unassisted without difficulty.   Initial Impression / Assessment and Plan / ED Course  I have reviewed the triage vital signs and the nursing notes.  Pertinent labs & imaging results that were available during my care of the patient were  reviewed by me and considered in my medical decision making (see chart for details).     Patient presents to the emergency department for evaluation of R hip and foot pain s/p mechanical fall at home tonight. Patient neurovascularly intact on exam. Imaging negative for fracture, dislocation, bony deformity. No swelling, erythema, heat to touch to the affected area. Compartments in the affected extremity are soft. The patient also underwent head CT given use of chronic Eliquis. This is negative for emergent intracranial process or hemorrhage.  Plan for supportive management including RICE and NSAIDs; primary care follow up as needed. She was placed in a CAM walker for stability given her R foot pain. Encouraged to use  a walker when ambulatory. Return precautions discussed and provided. Patient discharged in stable condition with no unaddressed concerns.   Final Clinical Impressions(s) / ED Diagnoses   Final diagnoses:  Fall, initial encounter  Foot pain, right  Contusion of right hip, initial encounter    ED Discharge Orders    None       Antony Madura, PA-C 08/02/18 0229    Marily Memos, MD 08/02/18 [phone]

## 2018-08-02 NOTE — ED Notes (Signed)
Pt ambulated well with an ace bandage. Ambulated well with a light 1 assist

## 2018-08-02 NOTE — Discharge Instructions (Signed)
We recommend that you wear a walking boot for stability.  Use a walker to improve your balance.  Take Tylenol for pain, though you may continue to use your home tramadol as previously prescribed.  Alternate ice and heat to areas of pain/injury to limit inflammation and spasm.  Follow-up with your primary care doctor to ensure symptoms resolved.

## 2018-08-15 ENCOUNTER — Telehealth: Payer: Self-pay | Admitting: Cardiology

## 2018-08-15 NOTE — Telephone Encounter (Signed)
New message     Pt c/o medication issue:  1. Name of Medication: hctz 25 mg   2. How are you currently taking this medication (dosage and times per day)? 1/2 tab every other day and  1 whole pill other days ( b/p drops on those days)  3. Are you having a reaction (difficulty breathing--STAT)? No   4. What is your medication issue? Pt has fallen twice and even went into hospital the first time she fell. Pt thinks medication is reason for falls.

## 2018-08-18 ENCOUNTER — Encounter: Payer: Self-pay | Admitting: Cardiology

## 2018-08-18 NOTE — Telephone Encounter (Signed)
Jodi Snyder at 08/18/2018 2:47 PM   Status: Signed    New Message  Patient calling to report her BP's to you per your previous conversation with her.

## 2018-08-18 NOTE — Telephone Encounter (Signed)
This encounter was created in error - please disregard.

## 2018-08-18 NOTE — Telephone Encounter (Signed)
Pt concerned that her HCTZ is causing her to fall/be dizzy. reports that she recently went to the hospital, last month, for a fall.  She thinks she may have tripped over her own feel though.  Reported/recorded BPs: 12/18 10am  125/73, HR 59    6pm  138/83, HR 77 12/20   8pm  129/73, HR 84 12/21 1130am 74/47, HR 66     (she took 1/2 tab this day)     9pm  121/76, HR 83 12/22 10am  125/68, HR 72  10pm  134/74, HR 80 12/23 1020pm 115/65, HR 70  1130  122/72, HR 76 12/24 10am  124/67, HR 70     (she took 1/2 tab this day)  11am  147/86, HR 74   4pm  155/80, HR 75   11pm  133/82, HR 81 12/28 11am  124/72, HR 77    (she took 1/2 tab this day)  1030pm 119/68, HR 67 12/29 1145am 113/67, HR 60    4pm  114/68, HR 62  10pm  129/65, HR 71 1/1  9am  139/74, HR 55   2pm  131/71, HR 65  6pm  127/80, HR 61 1/2 10am  121/71, HR 61  230pm  119/73, HR 67  6pm  135/69, HR 68  10pm   131/74, HR 80  Pt is going to take 1/2 tablet over the next several days.  She will continue to monitor her BPs/HRs.  She is aware I will f/u next week to determine if going to decrease to 1/2 tablet daily.  She will call over the weekend if needing advisement. Patient verbalized understanding and agreeable to plan.

## 2018-08-18 NOTE — Telephone Encounter (Signed)
New Message   Patient calling to report her BP's to you per your previous conversation with her.

## 2018-08-19 ENCOUNTER — Inpatient Hospital Stay (HOSPITAL_COMMUNITY)
Admission: EM | Admit: 2018-08-19 | Discharge: 2018-08-21 | DRG: 310 | Disposition: A | Discharge status: Home Health | Payer: Medicare Other | Attending: Oncology | Admitting: Oncology

## 2018-08-19 ENCOUNTER — Encounter (HOSPITAL_COMMUNITY): Payer: Self-pay | Admitting: Emergency Medicine

## 2018-08-19 ENCOUNTER — Emergency Department (HOSPITAL_COMMUNITY): Payer: Medicare Other

## 2018-08-19 DIAGNOSIS — R7989 Other specified abnormal findings of blood chemistry: Secondary | ICD-10-CM

## 2018-08-19 DIAGNOSIS — Z9181 History of falling: Secondary | ICD-10-CM

## 2018-08-19 DIAGNOSIS — Z8249 Family history of ischemic heart disease and other diseases of the circulatory system: Secondary | ICD-10-CM | POA: Diagnosis not present

## 2018-08-19 DIAGNOSIS — F411 Generalized anxiety disorder: Secondary | ICD-10-CM | POA: Diagnosis present

## 2018-08-19 DIAGNOSIS — I471 Supraventricular tachycardia: Secondary | ICD-10-CM | POA: Diagnosis present

## 2018-08-19 DIAGNOSIS — I129 Hypertensive chronic kidney disease with stage 1 through stage 4 chronic kidney disease, or unspecified chronic kidney disease: Secondary | ICD-10-CM | POA: Diagnosis present

## 2018-08-19 DIAGNOSIS — I10 Essential (primary) hypertension: Secondary | ICD-10-CM | POA: Diagnosis not present

## 2018-08-19 DIAGNOSIS — Z7952 Long term (current) use of systemic steroids: Secondary | ICD-10-CM | POA: Diagnosis not present

## 2018-08-19 DIAGNOSIS — E876 Hypokalemia: Secondary | ICD-10-CM | POA: Diagnosis present

## 2018-08-19 DIAGNOSIS — R001 Bradycardia, unspecified: Secondary | ICD-10-CM | POA: Diagnosis present

## 2018-08-19 DIAGNOSIS — Z7901 Long term (current) use of anticoagulants: Secondary | ICD-10-CM

## 2018-08-19 DIAGNOSIS — M4135 Thoracogenic scoliosis, thoracolumbar region: Secondary | ICD-10-CM | POA: Diagnosis present

## 2018-08-19 DIAGNOSIS — Z823 Family history of stroke: Secondary | ICD-10-CM

## 2018-08-19 DIAGNOSIS — M419 Scoliosis, unspecified: Secondary | ICD-10-CM | POA: Diagnosis present

## 2018-08-19 DIAGNOSIS — I491 Atrial premature depolarization: Secondary | ICD-10-CM

## 2018-08-19 DIAGNOSIS — M353 Polymyalgia rheumatica: Secondary | ICD-10-CM | POA: Diagnosis present

## 2018-08-19 DIAGNOSIS — Z79891 Long term (current) use of opiate analgesic: Secondary | ICD-10-CM | POA: Diagnosis not present

## 2018-08-19 DIAGNOSIS — Z87891 Personal history of nicotine dependence: Secondary | ICD-10-CM | POA: Diagnosis not present

## 2018-08-19 DIAGNOSIS — H401290 Low-tension glaucoma, unspecified eye, stage unspecified: Secondary | ICD-10-CM | POA: Diagnosis present

## 2018-08-19 DIAGNOSIS — I4719 Other supraventricular tachycardia: Secondary | ICD-10-CM

## 2018-08-19 DIAGNOSIS — Z66 Do not resuscitate: Secondary | ICD-10-CM | POA: Diagnosis present

## 2018-08-19 DIAGNOSIS — Z79899 Other long term (current) drug therapy: Secondary | ICD-10-CM | POA: Diagnosis not present

## 2018-08-19 DIAGNOSIS — Z90710 Acquired absence of both cervix and uterus: Secondary | ICD-10-CM | POA: Diagnosis not present

## 2018-08-19 DIAGNOSIS — R0789 Other chest pain: Secondary | ICD-10-CM | POA: Diagnosis present

## 2018-08-19 DIAGNOSIS — M329 Systemic lupus erythematosus, unspecified: Secondary | ICD-10-CM | POA: Diagnosis present

## 2018-08-19 DIAGNOSIS — I480 Paroxysmal atrial fibrillation: Secondary | ICD-10-CM | POA: Diagnosis present

## 2018-08-19 DIAGNOSIS — N183 Chronic kidney disease, stage 3 (moderate): Secondary | ICD-10-CM | POA: Diagnosis present

## 2018-08-19 HISTORY — DX: Unspecified atrial fibrillation: I48.91

## 2018-08-19 LAB — CBC WITH DIFFERENTIAL/PLATELET
Abs Immature Granulocytes: 0.01 10*3/uL (ref 0.00–0.07)
Basophils Absolute: 0 10*3/uL (ref 0.0–0.1)
Basophils Relative: 1 %
Eosinophils Absolute: 0.1 10*3/uL (ref 0.0–0.5)
Eosinophils Relative: 1 %
HEMATOCRIT: 44.6 % (ref 36.0–46.0)
Hemoglobin: 13.9 g/dL (ref 12.0–15.0)
Immature Granulocytes: 0 %
Lymphocytes Relative: 33 %
Lymphs Abs: 1.9 10*3/uL (ref 0.7–4.0)
MCH: 29.8 pg (ref 26.0–34.0)
MCHC: 31.2 g/dL (ref 30.0–36.0)
MCV: 95.5 fL (ref 80.0–100.0)
Monocytes Absolute: 0.5 10*3/uL (ref 0.1–1.0)
Monocytes Relative: 8 %
NRBC: 0 % (ref 0.0–0.2)
Neutro Abs: 3.4 10*3/uL (ref 1.7–7.7)
Neutrophils Relative %: 57 %
Platelets: 217 10*3/uL (ref 150–400)
RBC: 4.67 MIL/uL (ref 3.87–5.11)
RDW: 12.4 % (ref 11.5–15.5)
WBC: 5.9 10*3/uL (ref 4.0–10.5)

## 2018-08-19 LAB — URINALYSIS, ROUTINE W REFLEX MICROSCOPIC
Bilirubin Urine: NEGATIVE
Glucose, UA: NEGATIVE mg/dL
Hgb urine dipstick: NEGATIVE
Ketones, ur: NEGATIVE mg/dL
Leukocytes, UA: NEGATIVE
Nitrite: NEGATIVE
Protein, ur: NEGATIVE mg/dL
Specific Gravity, Urine: 1.005 (ref 1.005–1.030)
pH: 9 — ABNORMAL HIGH (ref 5.0–8.0)

## 2018-08-19 LAB — PROTIME-INR
INR: 1.05
Prothrombin Time: 13.6 seconds (ref 11.4–15.2)

## 2018-08-19 LAB — COMPREHENSIVE METABOLIC PANEL
ALK PHOS: 64 U/L (ref 38–126)
ALT: 22 U/L (ref 0–44)
AST: 34 U/L (ref 15–41)
Albumin: 3.8 g/dL (ref 3.5–5.0)
Anion gap: 10 (ref 5–15)
BUN: 26 mg/dL — ABNORMAL HIGH (ref 8–23)
CO2: 28 mmol/L (ref 22–32)
Calcium: 9.5 mg/dL (ref 8.9–10.3)
Chloride: 100 mmol/L (ref 98–111)
Creatinine, Ser: 1.08 mg/dL — ABNORMAL HIGH (ref 0.44–1.00)
GFR calc Af Amer: 55 mL/min — ABNORMAL LOW (ref >60)
GFR calc non Af Amer: 47 mL/min — ABNORMAL LOW (ref >60)
Glucose, Bld: 92 mg/dL (ref 70–99)
POTASSIUM: 2.9 mmol/L — AB (ref 3.5–5.1)
Sodium: 138 mmol/L (ref 135–145)
TOTAL PROTEIN: 7 g/dL (ref 6.5–8.1)
Total Bilirubin: 0.4 mg/dL (ref 0.3–1.2)

## 2018-08-19 LAB — I-STAT TROPONIN, ED
Troponin i, poc: 0.04 ng/mL (ref 0.00–0.08)
Troponin i, poc: 0.08 ng/mL (ref 0.00–0.08)

## 2018-08-19 MED ORDER — ALPRAZOLAM 0.25 MG PO TABS: 0.2500 mg | ORAL_TABLET | Freq: Two times a day (BID) | ORAL | Status: DC

## 2018-08-19 MED ORDER — POTASSIUM CHLORIDE CRYS ER 20 MEQ PO TBCR
40.0000 meq | EXTENDED_RELEASE_TABLET | Freq: Once | ORAL | Status: AC
  Administered 2018-08-19: 40 meq via ORAL
  Filled 2018-08-19: qty 2

## 2018-08-19 MED ORDER — ACETAMINOPHEN 650 MG RE SUPP: 650.0000 mg | Freq: Four times a day (QID) | RECTAL | Status: DC | PRN

## 2018-08-19 MED ORDER — PANTOPRAZOLE SODIUM 40 MG PO TBEC
40.0000 mg | DELAYED_RELEASE_TABLET | Freq: Every day | ORAL | Status: DC
  Administered 2018-08-19 – 2018-08-21 (×3): 40 mg via ORAL
  Filled 2018-08-19 (×3): qty 1

## 2018-08-19 MED ORDER — APIXABAN 2.5 MG PO TABS
2.5000 mg | ORAL_TABLET | Freq: Two times a day (BID) | ORAL | Status: DC
  Administered 2018-08-19 – 2018-08-21 (×4): 2.5 mg via ORAL
  Filled 2018-08-19 (×4): qty 1

## 2018-08-19 MED ORDER — ONDANSETRON HCL 4 MG/2ML IJ SOLN: 4.0000 mg | Freq: Four times a day (QID) | INTRAMUSCULAR | Status: DC | PRN

## 2018-08-19 MED ORDER — AMLODIPINE BESYLATE 5 MG PO TABS
5.0000 mg | ORAL_TABLET | Freq: Once | ORAL | Status: AC
  Administered 2018-08-19: 5 mg via ORAL
  Filled 2018-08-19: qty 1

## 2018-08-19 MED ORDER — LISINOPRIL 10 MG PO TABS
10.0000 mg | ORAL_TABLET | Freq: Once | ORAL | Status: AC
  Administered 2018-08-19: 10 mg via ORAL
  Filled 2018-08-19: qty 1

## 2018-08-19 MED ORDER — TRAMADOL-ACETAMINOPHEN 37.5-325 MG PO TABS
1.0000 | ORAL_TABLET | Freq: Two times a day (BID) | ORAL | Status: DC
  Administered 2018-08-19 – 2018-08-21 (×3): 1 via ORAL
  Filled 2018-08-19 (×4): qty 1

## 2018-08-19 MED ORDER — ALPRAZOLAM 0.25 MG PO TABS
0.2500 mg | ORAL_TABLET | Freq: Two times a day (BID) | ORAL | Status: DC
  Administered 2018-08-19 – 2018-08-20 (×2): 0.25 mg via ORAL
  Filled 2018-08-19 (×2): qty 1

## 2018-08-19 MED ORDER — ONDANSETRON HCL 4 MG PO TABS: 4.0000 mg | ORAL_TABLET | Freq: Four times a day (QID) | ORAL | Status: DC | PRN

## 2018-08-19 MED ORDER — HYDROXYCHLOROQUINE SULFATE 200 MG PO TABS
200.0000 mg | ORAL_TABLET | Freq: Every day | ORAL | Status: DC
  Administered 2018-08-19 – 2018-08-21 (×3): 200 mg via ORAL
  Filled 2018-08-19 (×3): qty 1

## 2018-08-19 MED ORDER — PREDNISONE 5 MG PO TABS
5.0000 mg | ORAL_TABLET | Freq: Every day | ORAL | Status: DC
  Administered 2018-08-20 – 2018-08-21 (×2): 5 mg via ORAL
  Filled 2018-08-19 (×2): qty 1

## 2018-08-19 MED ORDER — ACETAMINOPHEN 325 MG PO TABS: 650.0000 mg | ORAL_TABLET | Freq: Four times a day (QID) | ORAL | Status: DC | PRN

## 2018-08-19 NOTE — ED Notes (Signed)
Pt complained of feeling lightheadedness while standing.

## 2018-08-19 NOTE — ED Triage Notes (Signed)
Patient arrived with EMS from home reports intermittent palpitations onset yesterday , history of Afib , denies chest pain or SOB .

## 2018-08-19 NOTE — Consult Note (Addendum)
Cardiology Consultation:   Patient ID: Jodi Snyder MRN: [id number]; DOB: May 13, 1932  Admit date: 08/19/2018 Date of Consult: 08/19/2018  Primary Care Provider: Elspeth Cho., MD Primary Cardiologist: Will Jorja Loa, MD  Primary Electrophysiologist:  None    Patient Profile:   Jodi Snyder is a 85 y.o. female with a hx of hypertension, polymyalgia, PAF on Eliquis, CKD stage III, glaucoma who is being seen today for the evaluation of ectopic atrial rhythm at the request of Dr. Ethelda Chick.  History of Present Illness:   [name] is followed by Dr. Elberta Fortis for atrial fibrillation.  She developed A. fib in 01/2018 with palpitations, lightheadedness and shortness of breath.  She was successfully cardioverted and is on Eliquis for stroke risk reduction.  She has diltiazem as needed but is not on chronic medications for A. Fib.  She was last seen in our office on 06/12/2018 by Dr. Elberta Fortis.  She was having no further episodes of A. Fib.  The patient was seen in the ED on 08/01/2018 after a mechanical fall.  Since that time she called our office with complaints of lightheadedness and dizziness on 08/15/2018 and concerns of her HCTZ causing her symptoms that led to her fall.  It was decided to decrease her dose to half a tablet.  She presented today for evaluation of an abnormal feeling in her chest.  In the ED her ventricular rate was in the 110s-120s.  EKG showed ectopic atrial tachycardia.  She was hypokalemic with potassium of 2.9.  Upon my assessment the patient tells me that last night she just was not feeling well.  She had no specific complaints although she may have had a stomachache.  She checked her blood pressure which was 148/80 and her heart rate was 117.  The elevated heart rate alarmed her and she became very nervous.  She went to find her diltiazem to take an as needed dose but it was out of date.  She called 911 for advice on the outdated medication.  They came out to check  her and after seeing her EKG they insisted she come to the hospital.  The patient says that she has had problems with polymyalgia and has been unable to walk much at all.  She is only been out of the house a couple of times since November.  At her PCP office she was noted to have ankle edema and her amlodipine was switched to hydrochlorothiazide.  She notes that on the day after her fall in 08-17-2023 she had passed out.  She says she woke up on the floor.  She notes that she had been very dizzy that day.  A few days after that she checked her blood pressure and it was 74/47.  It was after that that she decided to decrease her hydrochlorothiazide by half.  She is having no dizziness or lightheadedness today.  She denies chest discomfort or shortness of breath.   The patient lives alone and has been very activity intolerant due to polymyalgia and scoliosis.  She says that her neighbors keep telling her that she needs to be in assisted living.  Past Medical History:  Diagnosis Date  . Atrial fibrillation (HCC)   . Hypertension   . Immune system disorder (HCC)   . Polymyalgia (HCC)     Past Surgical History:  Procedure Laterality Date  . ABDOMINAL HYSTERECTOMY    . BLADDER SURGERY    . OOPHORECTOMY       Home Medications:  Prior  to Admission medications   Medication Sig Start Date End Date Taking? Authorizing Provider  ALPRAZolam Prudy Feeler(XANAX) 0.5 MG tablet Take 0.25 mg by mouth 2 (two) times daily. Supper time and bedtime 04/20/18  Yes [provider]  bimatoprost (LUMIGAN) 0.03 % ophthalmic solution Place 1 drop into both eyes at bedtime.    Yes [provider]  Biotin (BIOTIN 5000) 5 MG CAPS Take 5 mg by mouth daily.   Yes [provider]  brinzolamide (AZOPT) 1 % ophthalmic suspension Place 1 drop into both eyes 3 (three) times daily.    Yes [provider]  cycloSPORINE (RESTASIS) 0.05 % ophthalmic emulsion Place 1 drop into both eyes 2 (two) times daily.     Yes [provider]  diltiazem (CARDIZEM) 30 MG tablet Take 1 tablet every 4 hours AS NEEDED for afib heart rate >100 Patient taking differently: Take 30 mg by mouth every 4 (four) hours as needed. AS NEEDED for afib heart rate >100 02/07/17  Yes Newman Niparroll, Donna C, NP  ELIQUIS 2.5 MG TABS tablet TAKE 1 TABLET BY MOUTH TWICE DAILY Patient taking differently: Take 2.5 mg by mouth 2 (two) times daily.  03/20/18  Yes Camnitz, Will Daphine DeutscherMartin, MD  esomeprazole (NEXIUM) 40 MG capsule Take 40 mg by mouth daily.    Yes [provider]  hydrochlorothiazide (HYDRODIURIL) 25 MG tablet Take 1 tablet (25 mg total) by mouth daily. Patient taking differently: Take 12.5-25 mg by mouth daily. Alternating every other day 07/25/18  Yes Camnitz, Andree CossWill Martin, MD  hydroxychloroquine (PLAQUENIL) 200 MG tablet Take 200 mg by mouth daily.   Yes [provider]  Hypromellose (ARTIFICIAL TEARS OP) Place 1 drop into both eyes daily as needed (dry eyes).   Yes [provider]  Multiple Vitamins-Minerals (ICAPS AREDS 2 PO) Take 2 capsules by mouth 2 (two) times daily.   Yes [provider]  predniSONE (DELTASONE) 5 MG tablet Take 5 mg by mouth daily.  01/13/18  Yes [provider]  traMADol-acetaminophen (ULTRACET) 37.5-325 MG tablet Take 1 tablet by mouth 2 (two) times daily.    Yes [provider]    Inpatient Medications: Scheduled Meds:  Continuous Infusions:  PRN Meds:   Allergies:    Allergies  Allergen Reactions  . Penicillins Hives and Swelling    Has patient had a PCN reaction causing immediate rash, facial/tongue/throat swelling, SOB or lightheadedness with hypotension: Yes Has patient had a PCN reaction causing severe rash involving mucus membranes or skin necrosis: No Has patient had a PCN reaction that required hospitalization: No Has patient had a PCN reaction occurring within the last 10 years: No If all of the above answers are "NO", then may  proceed with Cephalosporin use.   . Brimonidine Other (See Comments)  . Other     Nuts causes blisters No spices coffee  . Penicillin G Other (See Comments)  . Timolol Maleate Other (See Comments)    Pt becomes faint  . Sulfa Antibiotics Rash  . Sulfasalazine Rash    Social History:   Social History   Socioeconomic History  . Marital status: Widowed    Spouse name: Not on file  . Number of children: Not on file  . Years of education: Not on file  . Highest education level: Not on file  Occupational History  . Not on file  Social Needs  . Financial resource strain: Not on file  . Food insecurity:    Worry: Not on file  Inability: Not on file  . Transportation needs:    Medical: Not on file    Non-medical: Not on file  Tobacco Use  . Smoking status: Former Games developermoker  . Smokeless tobacco: Never Used  Substance and Sexual Activity  . Alcohol use: No  . Drug use: No  . Sexual activity: Not on file  Lifestyle  . Physical activity:    Days per week: Not on file    Minutes per session: Not on file  . Stress: Not on file  Relationships  . Social connections:    Talks on phone: Not on file    Gets together: Not on file    Attends religious service: Not on file    Active member of club or organization: Not on file    Attends meetings of clubs or organizations: Not on file    Relationship status: Not on file  . Intimate partner violence:    Fear of current or ex partner: Not on file    Emotionally abused: Not on file    Physically abused: Not on file    Forced sexual activity: Not on file  Other Topics Concern  . Not on file  Social History Narrative  . Not on file    Family History:    Family History  Problem Relation Age of Onset  . Heart attack Mother   . Stroke Father   . Anuerysm Father      ROS:  Please see the history of present illness.   All other ROS reviewed and negative.     Physical Exam/Data:   Vitals:   08/19/18 1015 08/19/18 1030  08/19/18 1045 08/19/18 1130  BP: 121/72 140/73 (!) 158/74 (!) 164/95  Pulse: 63 73 81 72  Resp: (!) 24 17 10 13   Temp:      TempSrc:      SpO2: 100% 100% 100% 100%  Weight:      Height:       No intake or output data in the 24 hours ending 08/19/18 1203 Filed Weights   08/19/18 0624  Weight: 45.4 kg   Body mass index is 17.71 kg/m.  General: Thin, frail female, in no acute distress HEENT: normal Lymph: no adenopathy Neck: no JVD Endocrine:  No thryomegaly Vascular: No carotid bruits; FA pulses 2+ bilaterally without bruits  Cardiac:  normal S1, S2; RRR with occasional short runs of fast rate; no murmur Lungs:  clear to auscultation bilaterally, no wheezing, rhonchi or rales  Abd: soft, nontender, no hepatomegaly  Ext: no edema Musculoskeletal:  No deformities, BUE and BLE strength normal and equal Skin: warm and dry  Neuro:  CNs 2-12 intact, no focal abnormalities noted Psych:  Normal affect   EKG:  The EKG was personally reviewed and demonstrates:  Ectopic atrial rhythm, 115 bpm, later 77 bpm Telemetry:  Telemetry was personally reviewed and demonstrates:  Sinus rhythm (pt is on dynamap monitor, unable to review previous rhythm)  Relevant CV Studies:  Echo 02/09/2017 Study Conclusions - Left ventricle: The cavity size was normal. Wall thickness was   normal. Systolic function was normal. The estimated ejection   fraction was in the range of 60% to 65%. Wall motion was normal;   there were no regional wall motion abnormalities. Features are   consistent with a pseudonormal left ventricular filling pattern,   with concomitant abnormal relaxation and increased filling   pressure (grade 2 diastolic dysfunction). - Aortic valve: Mildly calcified annulus. - Mitral valve: There was  mild regurgitation. - Pulmonary arteries: Systolic pressure was mildly increased. PA   peak pressure: 32 mm Hg (S).  Laboratory Data:  Chemistry Recent Labs  Lab 08/19/18 0637  NA 138  K  2.9*  CL 100  CO2 28  GLUCOSE 92  BUN 26*  CREATININE 1.08*  CALCIUM 9.5  GFRNONAA 47*  GFRAA 55*  ANIONGAP 10    Recent Labs  Lab 08/19/18 0637  PROT 7.0  ALBUMIN 3.8  AST 34  ALT 22  ALKPHOS 64  BILITOT 0.4   Hematology Recent Labs  Lab 08/19/18 0637  WBC 5.9  RBC 4.67  HGB 13.9  HCT 44.6  MCV 95.5  MCH 29.8  MCHC 31.2  RDW 12.4  PLT 217   Cardiac EnzymesNo results for input(s): TROPONINI in the last 168 hours.  Recent Labs  Lab 08/19/18 0654  TROPIPOC 0.04    BNPNo results for input(s): BNP, PROBNP in the last 168 hours.  DDimer No results for input(s): DDIMER in the last 168 hours.  Radiology/Studies:  Dg Chest 2 View  Result Date: 08/19/2018 CLINICAL DATA:  Palpitations overnight. EXAM: CHEST - 2 VIEW COMPARISON:  02/03/2017 FINDINGS: Artifact overlies the chest. Heart size is normal. Chronic aortic atherosclerosis. Chronic pleural and parenchymal scarring at the lung apices right more than left. No sign of active infiltrate, mass, effusion or collapse. IMPRESSION: No active disease. Chronic pleural and parenchymal scarring at the apices right more than left. Electronically Signed   By: Paulina Fusi M.D.   On: 08/19/2018 08:04   Ct Head Wo Contrast  Result Date: 08/19/2018 CLINICAL DATA:  Fall after syncopal episode hitting head. Patient on blood thinners. EXAM: CT HEAD WITHOUT CONTRAST CT CERVICAL SPINE WITHOUT CONTRAST TECHNIQUE: Multidetector CT imaging of the head and cervical spine was performed following the standard protocol without intravenous contrast. Multiplanar CT image reconstructions of the cervical spine were also generated. COMPARISON:  Head CT 08/01/2018 FINDINGS: CT HEAD FINDINGS Brain: Ventricles, cisterns and other CSF spaces are within normal. There is mild chronic ischemic microvascular disease. There is no mass, mass effect, shift of midline structures or acute hemorrhage. No evidence of acute infarction. Vascular: No hyperdense vessel or  unexpected calcification. Skull: Normal. Negative for fracture or focal lesion. Sinuses/Orbits: Orbits are normal.  Paranasal sinuses are clear. Other: None. CT CERVICAL SPINE FINDINGS Alignment: Within normal. Skull base and vertebrae: Mild spondylosis throughout the cervical spine. Vertebral body heights are maintained. Atlantoaxial articulation is within normal. There is mild uncovertebral joint spurring and minimal facet arthropathy. Mild right-sided neural foraminal narrowing at the C5-6 level. No acute fracture or subluxation. Soft tissues and spinal canal: No prevertebral fluid or swelling. No visible canal hematoma. Disc levels:  Mild disc space narrowing at the C5-6 and C6-7 levels. Upper chest: Partially visualized biapical nodular scarring with calcification. Other: None. IMPRESSION: No acute brain injury. Chronic ischemic microvascular disease. No acute cervical spine injury. Mild spondylosis of the cervical spine with mild disc disease at the C5-6 and C6-7 levels. Minimal right-sided neural foraminal narrowing at the C5-6 level. Biapical nodularity with calcification likely chronic scarring and granulomatous change. Recommend noncontrast chest CT on elective basis. Electronically Signed   By: Elberta Fortis M.D.   On: 08/19/2018 07:58   Ct Cervical Spine Wo Contrast  Result Date: 08/19/2018 CLINICAL DATA:  Fall after syncopal episode hitting head. Patient on blood thinners. EXAM: CT HEAD WITHOUT CONTRAST CT CERVICAL SPINE WITHOUT CONTRAST TECHNIQUE: Multidetector CT imaging of the head and cervical  spine was performed following the standard protocol without intravenous contrast. Multiplanar CT image reconstructions of the cervical spine were also generated. COMPARISON:  Head CT 08/01/2018 FINDINGS: CT HEAD FINDINGS Brain: Ventricles, cisterns and other CSF spaces are within normal. There is mild chronic ischemic microvascular disease. There is no mass, mass effect, shift of midline structures or acute  hemorrhage. No evidence of acute infarction. Vascular: No hyperdense vessel or unexpected calcification. Skull: Normal. Negative for fracture or focal lesion. Sinuses/Orbits: Orbits are normal.  Paranasal sinuses are clear. Other: None. CT CERVICAL SPINE FINDINGS Alignment: Within normal. Skull base and vertebrae: Mild spondylosis throughout the cervical spine. Vertebral body heights are maintained. Atlantoaxial articulation is within normal. There is mild uncovertebral joint spurring and minimal facet arthropathy. Mild right-sided neural foraminal narrowing at the C5-6 level. No acute fracture or subluxation. Soft tissues and spinal canal: No prevertebral fluid or swelling. No visible canal hematoma. Disc levels:  Mild disc space narrowing at the C5-6 and C6-7 levels. Upper chest: Partially visualized biapical nodular scarring with calcification. Other: None. IMPRESSION: No acute brain injury. Chronic ischemic microvascular disease. No acute cervical spine injury. Mild spondylosis of the cervical spine with mild disc disease at the C5-6 and C6-7 levels. Minimal right-sided neural foraminal narrowing at the C5-6 level. Biapical nodularity with calcification likely chronic scarring and granulomatous change. Recommend noncontrast chest CT on elective basis. Electronically Signed   By: Elberta Fortis M.D.   On: 08/19/2018 07:58   Dg Hip Unilat W Or Wo Pelvis 2-3 Views Right  Result Date: 08/19/2018 CLINICAL DATA:  Right lateral hip pain over the last 2 weeks. EXAM: DG HIP (WITH OR WITHOUT PELVIS) 2-3V RIGHT COMPARISON:  08/01/2018 FINDINGS: No evidence of fracture. No evidence of osteoarthritis of either hip. Calcifications adjacent to the right greater trochanter could be seen with trochanteric bursitis. IMPRESSION: No acute finding. Calcifications adjacent to the right greater trochanter could be seen with trochanteric bursitis. Electronically Signed   By: Paulina Fusi M.D.   On: 08/19/2018 08:06    Assessment  and Plan:   Chest discomfort -Patient presented with vague complaints found to be in ectopic atrial rhythm in the 110's.  She denies having had any chest discomfort to me.  She presented due to concerns of her elevated heart rate which was 117 last night.  Her diltiazem was out of date. -Troponins mildly elevated at 0.04 and 0.08 -Potassium was 2.9, has been replaced  Hypokalemia -Potassium 2.9 on presentation -Unclear of what led to this.  The patient has been on hydrochlorothiazide 12.5 mg daily -Potassium has been replaced.  Follow-up potassium level in the morning -Stop hydrochlorothiazide  Paroxysmal atrial fibrillation -Patient has been maintaining sinus rhythm on no rate control medications.  She is on Eliquis for stroke risk reduction withCHA2DS2-VASc Score of 4.  -The patient has diltiazem 30 mg to be used as needed, has not required any. -Patient currently in an ectopic atrial rhythm, rate controlled with brief episodes of fast rates -Possibly could use a beta-blocker for rate control and would also help BP.  Will discuss with Dr. Cristal Deer  Hypertension -On lisinopril 40 mg daily, Norvasc 5 mg started in October for elevated blood pressure but switched to hydrochlorothiazide 25 mg daily due to ankle edema.  Patient decreased her HCTZ to 12.5 mg daily due to an episode of syncope in December and a low blood pressure of 74/47 -Blood pressure on presentation was 162/100, is improved but still slightly elevated at 149/79. -Resume her  lisinopril and resume amlodipine 5 mg daily  Frailty -Patient reports recent problems with polymyalgia and scoliosis that is very limiting of her activity.  She appears very frail.  She lives alone and may be appropriate for admission for physical therapy and possible assisted living placement.  Recommend admission to medical service.     For questions or updates, please contact [facility] Please consult [email or web address] for contact info under      Signed, Berton Bon, NP  08/19/2018 12:03 PM

## 2018-08-19 NOTE — ED Provider Notes (Signed)
[facility] Provider [facility]   CSN: [id number] Arrival date & time: 08/19/18  [id number]     History   Chief Complaint Chief Complaint  Patient presents with  . Palpitations    Afib    HPI Vernard GamblesMargaret Rettig is a 85 y.o. female with past medical history of A. fib, hypertension, polymyalgia, CKD stage III, glaucoma, who presents today for evaluation of discomfort in her chest.  She reports that she woke up at about 4:30 AM today and has had a abnormal sensation in her chest.  She denies any true chest pain or shortness of breath.  She has a prescription for Cardizem at home for if she feels like she is having A. fib, however she did not take it as she reports it is out of date.  She reports compliance with all of her other medications.  She does report that she had a fall on the 18th after she passed out in her kitchen.  She was seen on the 17th for a mechanical fall.  She is unsure why she passed out, however denies any other syncopal events.  She reports compliance with her Eliquis.   She currently denies any shortness of breath.  HPI  Past Medical History:  Diagnosis Date  . Atrial fibrillation (HCC)   . Hypertension   . Immune system disorder (HCC)   . Polymyalgia [facility](HCC)     Patient Active Problem List   Diagnosis Date Noted  . Atrial tachycardia (HCC) 08/19/2018  . Asymptomatic microscopic hematuria 01/05/2018  . Paroxysmal atrial fibrillation (HCC) 01/05/2018  . Scoliosis 10/18/2017  . Thoracogenic scoliosis of thoracolumbar region 10/08/2017  . Glaucoma 06/21/2016  . Drug therapy 01/28/2016  . Benign hypertension with CKD (chronic kidney disease) stage III (HCC) 01/16/2016  . Discoid lupus erythematosus 01/16/2016  . Generalized anxiety disorder 01/16/2016  . Osteoporosis 01/16/2016  . Polymyalgia rheumatica (HCC) 01/16/2016  . Closed fracture of distal end of left radius 11/11/2014  . Dry eye syndrome 08/03/2012  . Other states following  surgery of eye and adnexa 04/21/2012  . 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 Low-tension glaucoma, unspecified eye, stage unspecified 04/07/2012  . Pseudophakia of both eyes 04/07/2012    Past Surgical History:  Procedure Laterality Date  . ABDOMINAL HYSTERECTOMY    . BLADDER SURGERY    . OOPHORECTOMY       OB History   No obstetric history on file.      Home Medications    Prior to Admission medications   Medication Sig Start Date End Date Taking? Authorizing Provider  ALPRAZolam Prudy Feeler(XANAX) 0.5 MG tablet Take 0.25 mg by mouth 2 (two) times daily. Supper time and bedtime 04/20/18  Yes [provider]  bimatoprost (LUMIGAN) 0.03 % ophthalmic solution Place 1 drop into both eyes at bedtime.    Yes [provider]  Biotin (BIOTIN 5000) 5 MG CAPS Take 5 mg by mouth daily.   Yes [provider]  brinzolamide (AZOPT) 1 % ophthalmic suspension Place 1 drop into both eyes 3 (three) times daily.    Yes [provider]  cycloSPORINE (RESTASIS) 0.05 % ophthalmic emulsion Place 1 drop into both eyes 2 (two) times daily.    Yes [provider]  diltiazem (CARDIZEM) 30 MG tablet Take 1 tablet every 4 hours AS NEEDED for afib heart rate >100 Patient taking differently: Take 30 mg by mouth every 4 (four) hours as needed. AS NEEDED for afib heart rate >100 02/07/17  Yes Newman Nip, NP  ELIQUIS 2.5 MG TABS tablet TAKE 1 TABLET BY MOUTH TWICE DAILY Patient taking differently: Take 2.5 mg by mouth 2 (two) times daily.  03/20/18  Yes Camnitz, Will Daphine Deutscher, MD  esomeprazole (NEXIUM) 40 MG capsule Take 40 mg by mouth daily.    Yes [provider]  hydrochlorothiazide (HYDRODIURIL) 25 MG tablet Take 1 tablet (25 mg total) by mouth daily. Patient taking differently: Take 12.5-25 mg by mouth daily. Alternating every other day 07/25/18  Yes Camnitz, Andree Coss, MD  hydroxychloroquine (PLAQUENIL) 200 MG tablet Take 200 mg by mouth daily.   Yes  [provider]  Hypromellose (ARTIFICIAL TEARS OP) Place 1 drop into both eyes daily as needed (dry eyes).   Yes [provider]  Multiple Vitamins-Minerals (ICAPS AREDS 2 PO) Take 2 capsules by mouth 2 (two) times daily.   Yes [provider]  predniSONE (DELTASONE) 5 MG tablet Take 5 mg by mouth daily.  01/13/18  Yes [provider]  traMADol-acetaminophen (ULTRACET) 37.5-325 MG tablet Take 1 tablet by mouth 2 (two) times daily.    Yes [provider]    Family History Family History  Problem Relation Age of Onset  . Heart attack Mother   . Stroke Father   . Anuerysm Father     Social History Social History   Tobacco Use  . Smoking status: Former Games developer  . Smokeless tobacco: Never Used  Substance Use Topics  . Alcohol use: No  . Drug use: No     Allergies   Penicillins; Brimonidine; Other; Penicillin g; Timolol maleate; Sulfa antibiotics; and Sulfasalazine   Review of Systems Review of Systems  Constitutional: Negative for chills and fever.  HENT: Negative for congestion.   Respiratory: Positive for chest tightness. Negative for shortness of breath.   Cardiovascular: Negative for palpitations and leg swelling.  Gastrointestinal: Negative for abdominal pain, diarrhea, nausea and vomiting.  Neurological: Positive for syncope (about 17 days ago). Negative for weakness.  All other systems reviewed and are negative.    Physical Exam Updated Vital Signs BP (!) 149/79   Pulse 69   Temp 97.8 F (36.6 C) (Oral)   Resp 18   Ht 5\' 3"  (1.6 m)   Wt 45.4 kg   SpO2 100%   BMI 17.71 kg/m   Physical Exam Vitals signs and nursing note reviewed.  Constitutional:      General: She is not in acute distress.    Appearance: She is well-developed. She is not diaphoretic.  HENT:     Head: Normocephalic and atraumatic.     Mouth/Throat:     Mouth: Mucous membranes are moist.  Eyes:     Conjunctiva/sclera: Conjunctivae normal.    Neck:     Musculoskeletal: Neck supple.  Cardiovascular:     Rate and Rhythm: Regular rhythm. Tachycardia present.     Pulses: Normal pulses.     Heart sounds: Normal heart sounds. No murmur.  Pulmonary:     Effort: Pulmonary effort is normal. No respiratory distress.     Breath sounds: Normal breath sounds. No stridor. No rales.  Abdominal:     General: There is no distension.     Palpations: Abdomen is soft.     Tenderness: There is no abdominal tenderness.  Musculoskeletal: Normal range of motion.     Right lower leg: No edema.     Left lower leg: No edema.  Lymphadenopathy:     Cervical: No cervical adenopathy.  Skin:    General: Skin is warm and dry.     Capillary Refill: Capillary refill takes less than 2 seconds.  Neurological:     General: No focal deficit present.     Mental Status: She is alert and oriented to person, place, and time.  Psychiatric:        Behavior: Behavior normal.      ED Treatments / Results  Labs (all labs ordered are listed, but only abnormal results are displayed) Labs Reviewed  COMPREHENSIVE METABOLIC PANEL - Abnormal; Notable for the following components:      Result Value   Potassium 2.9 (*)    BUN 26 (*)    Creatinine, Ser 1.08 (*)    GFR calc non Af Amer 47 (*)    GFR calc Af Amer 55 (*)    All other components within normal limits  URINALYSIS, ROUTINE W REFLEX MICROSCOPIC - Abnormal; Notable for the following components:   Color, Urine STRAW (*)    pH 9.0 (*)    All other components within normal limits  CBC WITH DIFFERENTIAL/PLATELET  PROTIME-INR  BASIC METABOLIC PANEL  I-STAT TROPONIN, ED  I-STAT TROPONIN, ED    EKG EKG Interpretation  Date/Time:  Saturday August 19 2018 06:23:17 EST Ventricular Rate:  115 PR Interval:    QRS Duration: 80 QT Interval:  357 QTC Calculation: 494 R Axis:   72 Text Interpretation:  Ectopic atrial tachycardia, unifocal Repol abnrm, severe global ischemia (LM/MVD) Since last tracing  rate faster Confirmed by [place], Doreatha Martin [phone]) on 08/19/2018 7:24:17 AM   EKG Interpretation  Date/Time:  Saturday August 19 2018 10:06:08 EST Ventricular Rate:  77 PR Interval:    QRS Duration: 80 QT Interval:  394 QTC Calculation: 446 R Axis:   79 Text Interpretation:  Sinus arrhythmia Anteroseptal infarct, age indeterminate Since last tracing rate slower Confirmed by Doug Sou [phone]) on 08/19/2018 10:13:26 AM        Radiology Dg Chest 2 View  Result Date: 08/19/2018 CLINICAL DATA:  Palpitations overnight. EXAM: CHEST - 2 VIEW COMPARISON:  02/03/2017 FINDINGS: Artifact overlies the chest. Heart size is normal. Chronic aortic atherosclerosis. Chronic pleural and parenchymal scarring at the lung apices right more than left. No sign of active infiltrate, mass, effusion or collapse. IMPRESSION: No active disease. Chronic pleural and parenchymal scarring at the apices right more than left. Electronically Signed   By: Paulina Fusi M.D.   On: 08/19/2018 08:04   Ct Head Wo Contrast  Result Date: 08/19/2018 CLINICAL DATA:  Fall after syncopal episode hitting head. Patient on blood thinners. EXAM: CT HEAD WITHOUT CONTRAST CT CERVICAL SPINE WITHOUT CONTRAST TECHNIQUE: Multidetector CT imaging of the head and cervical spine was performed following the standard protocol without intravenous contrast. Multiplanar CT image reconstructions of the cervical spine were also generated. COMPARISON:  Head CT 08/01/2018 FINDINGS: CT HEAD FINDINGS Brain: Ventricles, cisterns and other CSF spaces are within normal. There is mild chronic ischemic microvascular disease. There is no mass, mass effect, shift of midline structures or acute hemorrhage. No evidence of acute infarction. Vascular: No hyperdense vessel or unexpected calcification. Skull: Normal. Negative for fracture or focal lesion. Sinuses/Orbits: Orbits are normal.  Paranasal sinuses are clear. Other: None. CT CERVICAL SPINE FINDINGS Alignment: Within  normal. Skull base and vertebrae: Mild spondylosis throughout the cervical spine. Vertebral body heights are maintained. Atlantoaxial articulation is within normal. There is mild uncovertebral joint spurring and minimal facet arthropathy. Mild right-sided neural foraminal narrowing at the  C5-6 level. No acute fracture or subluxation. Soft tissues and spinal canal: No prevertebral fluid or swelling. No visible canal hematoma. Disc levels:  Mild disc space narrowing at the C5-6 and C6-7 levels. Upper chest: Partially visualized biapical nodular scarring with calcification. Other: None. IMPRESSION: No acute brain injury. Chronic ischemic microvascular disease. No acute cervical spine injury. Mild spondylosis of the cervical spine with mild disc disease at the C5-6 and C6-7 levels. Minimal right-sided neural foraminal narrowing at the C5-6 level. Biapical nodularity with calcification likely chronic scarring and granulomatous change. Recommend noncontrast chest CT on elective basis. Electronically Signed   By: Elberta Fortis M.D.   On: 08/19/2018 07:58   Ct Cervical Spine Wo Contrast  Result Date: 08/19/2018 CLINICAL DATA:  Fall after syncopal episode hitting head. Patient on blood thinners. EXAM: CT HEAD WITHOUT CONTRAST CT CERVICAL SPINE WITHOUT CONTRAST TECHNIQUE: Multidetector CT imaging of the head and cervical spine was performed following the standard protocol without intravenous contrast. Multiplanar CT image reconstructions of the cervical spine were also generated. COMPARISON:  Head CT 08/01/2018 FINDINGS: CT HEAD FINDINGS Brain: Ventricles, cisterns and other CSF spaces are within normal. There is mild chronic ischemic microvascular disease. There is no mass, mass effect, shift of midline structures or acute hemorrhage. No evidence of acute infarction. Vascular: No hyperdense vessel or unexpected calcification. Skull: Normal. Negative for fracture or focal lesion. Sinuses/Orbits: Orbits are normal.   Paranasal sinuses are clear. Other: None. CT CERVICAL SPINE FINDINGS Alignment: Within normal. Skull base and vertebrae: Mild spondylosis throughout the cervical spine. Vertebral body heights are maintained. Atlantoaxial articulation is within normal. There is mild uncovertebral joint spurring and minimal facet arthropathy. Mild right-sided neural foraminal narrowing at the C5-6 level. No acute fracture or subluxation. Soft tissues and spinal canal: No prevertebral fluid or swelling. No visible canal hematoma. Disc levels:  Mild disc space narrowing at the C5-6 and C6-7 levels. Upper chest: Partially visualized biapical nodular scarring with calcification. Other: None. IMPRESSION: No acute brain injury. Chronic ischemic microvascular disease. No acute cervical spine injury. Mild spondylosis of the cervical spine with mild disc disease at the C5-6 and C6-7 levels. Minimal right-sided neural foraminal narrowing at the C5-6 level. Biapical nodularity with calcification likely chronic scarring and granulomatous change. Recommend noncontrast chest CT on elective basis. Electronically Signed   By: Elberta Fortis M.D.   On: 08/19/2018 07:58   Dg Hip Unilat W Or Wo Pelvis 2-3 Views Right  Result Date: 08/19/2018 CLINICAL DATA:  Right lateral hip pain over the last 2 weeks. EXAM: DG HIP (WITH OR WITHOUT PELVIS) 2-3V RIGHT COMPARISON:  08/01/2018 FINDINGS: No evidence of fracture. No evidence of osteoarthritis of either hip. Calcifications adjacent to the right greater trochanter could be seen with trochanteric bursitis. IMPRESSION: No acute finding. Calcifications adjacent to the right greater trochanter could be seen with trochanteric bursitis. Electronically Signed   By: Paulina Fusi M.D.   On: 08/19/2018 08:06    Procedures Procedures (including critical care time)  Medications Ordered in ED Medications  potassium chloride SA (K-DUR,KLOR-CON) CR tablet 40 mEq (40 mEq Oral Given 08/19/18 0806)    Orthostatic VS  for the past 24 hrs:  BP- Lying Pulse- Lying BP- Sitting Pulse- Sitting BP- Standing at 0 minutes Pulse- Standing at 0 minutes  08/19/18 1050 141/79 67 (!) 163/92 81 (!) 144/96 82       Initial Impression / Assessment and Plan / ED Course  I have reviewed the triage vital signs and  the nursing notes.  Pertinent labs & imaging results that were available during my care of the patient were reviewed by me and considered in my medical decision making (see chart for details).  Clinical Course as of Aug 19 1701  Sat Aug 19, 2018  0845 Spoke with on call Cardiology who says they will come see patient.  Asked me to give her the 30mg  home cardizem, stating that even if it is ectopic it will help with her rate. Asked that once they see the patient for them to let me know of recommendations.    [EH]  1130 She reports her heart is feeling normal and she feels better.     [EH]  1542 Spoke with admitting team who will see patient for admission.    [EH]    Clinical Course User Index [EH] Cristina GongHammond, Zacharie Portner W, PA-C   Patient presents today for evaluation of an abnormal feeling in her chest.  She has home diltiazem to take as needed for A. fib however did not take it as it is reportedly expired.  On arrival here her ventricular rate is in the 110s to 120s.  She reports compliance with her Eliquis.  EKG shows ectopic atrial tachycardia.  Will consult cardiology for treatment recommendations.  Her troponin is not significantly elevated.  Her labs showed a potassium of 2.9, she is given 3640 M EQ p.o.  Patient does report that she had a syncopal event since the last time that she was seen.  She is on Eliquis, therefore CT head and neck were obtained without evidence of acute abnormality.  X-ray of the right hip was obtained without fracture or dislocation.    This patient was seen as a shared visit with Dr. Rennis ChrisJacobowitz.    Patient's heart rate significant improved while in the department after being given  potassium PO, repeat EKG showed she was not in ectopic atrial tachycardia any more.   I spoke with internal medicine teaching service who agreed to admit patient for continued monitoring at the recommendation of cardiology.   Final Clinical Impressions(s) / ED Diagnoses   Final diagnoses:  Atrial ectopic tachycardia [facility](HCC)    ED Discharge Orders    None       Cristina GongHammond, Viola Kinnick W, PA-C 08/19/18 1708    Doug SouJacubowitz, Sam, MD 08/19/18 [phone]

## 2018-08-19 NOTE — ED Notes (Signed)
Patient transported to CT 

## 2018-08-19 NOTE — ED Provider Notes (Signed)
Complains of a vague discomfort in her chest since awakening 4:30 AM today.  She denies any chest pain or shortness of breath.  Denies lightheadedness.  No other associated symptoms.  No treatment prior to coming here on exam no distress lungs clear to auscultation heart tachycardic regular rhythm abdomen nondistended nontender   Doug Sou, MD 08/19/18 [phone]

## 2018-08-19 NOTE — H&P (Addendum)
Date: 08/19/2018               Patient Name:  Jodi Snyder MRN: [id number]  DOB: 20-Jun-1932 Age / Sex: 85 y.o., female   PCP: Elspeth Cho., MD         Medical Service: Internal Medicine Teaching Service         Attending Physician: Dr. Burns Spain, MD    First Contact: Dr. Karilyn Cota Pager: [phone]  Second Contact: Dr. Caron Presume Pager: [phone]       After Hours (After 5p/  First Contact Pager: [phone]  weekends / holidays): Second Contact Pager: [phone]   Chief Complaint: Chest discomfort  History of Present Illness: [name] is an 85 y.o female with a history of atrial fibrillation, HTN, polymyalgia, lupus, CKD and glaucoma presenting with chest discomfort. She noticed the discomfort last night and when she checked her heart rate it was 117. She was found to have atrial fibrillation in 03/2018 and was instructed to take diltiazem if she ever felt her heart racing so she attempted to take this medication she realized it was expired. She called the EMS and was brought to the ER. She stated in August when she was found to have atrial fibrillation her whole body was shaking and she felt like she was having a heart attack. She has not had any episodes like that since August. She denies any chest pain at this time or SOB. She stated she has had falls recently. She felt that hydrochlorothiazide was making her faint and decreased her dose by half. She endorsed some stomach pain that she thought was secondary to not eating since last night. She takes medications for her rheumatologic conditions, hypertension and anxiety. She reported her blood pressures are normally elevated to the 180's and one time above 200. She said her activity is very limited due to polymyalgia and scoliosis.   ED course: On arrival, she was hypertensive 162/100 and tachycardic 115. I-stat troponin was 0.08. CBC and UA were unremarkable. She was hypokalemic at 2.9, Cr 1.08. Chest xray showed no active  disease. Right hip xray showed no acute findings.  CT head was done due to a recent fall, which showed no acute brain injury.    Meds:  Current Meds  Medication Sig  . ALPRAZolam (XANAX) 0.5 MG tablet Take 0.25 mg by mouth 2 (two) times daily. Supper time and bedtime  . bimatoprost (LUMIGAN) 0.03 % ophthalmic solution Place 1 drop into both eyes at bedtime.   . Biotin (BIOTIN 5000) 5 MG CAPS Take 5 mg by mouth daily.  . brinzolamide (AZOPT) 1 % ophthalmic suspension Place 1 drop into both eyes 3 (three) times daily.   . cycloSPORINE (RESTASIS) 0.05 % ophthalmic emulsion Place 1 drop into both eyes 2 (two) times daily.   Marland Kitchen diltiazem (CARDIZEM) 30 MG tablet Take 1 tablet every 4 hours AS NEEDED for afib heart rate >100 (Patient taking differently: Take 30 mg by mouth every 4 (four) hours as needed. AS NEEDED for afib heart rate >100)  . ELIQUIS 2.5 MG TABS tablet TAKE 1 TABLET BY MOUTH TWICE DAILY (Patient taking differently: Take 2.5 mg by mouth 2 (two) times daily. )  . esomeprazole (NEXIUM) 40 MG capsule Take 40 mg by mouth daily.   . hydrochlorothiazide (HYDRODIURIL) 25 MG tablet Take 1 tablet (25 mg total) by mouth daily. (Patient taking differently: Take 12.5-25 mg by mouth daily. Alternating every other day)  . hydroxychloroquine (PLAQUENIL)  200 MG tablet Take 200 mg by mouth daily.  . Hypromellose (ARTIFICIAL TEARS OP) Place 1 drop into both eyes daily as needed (dry eyes).  . Multiple Vitamins-Minerals (ICAPS AREDS 2 PO) Take 2 capsules by mouth 2 (two) times daily.  . predniSONE (DELTASONE) 5 MG tablet Take 5 mg by mouth daily.   . traMADol-acetaminophen (ULTRACET) 37.5-325 MG tablet Take 1 tablet by mouth 2 (two) times daily.   . [DISCONTINUED] L-LYSINE PO Take 2 tablets by mouth 2 (two) times daily.      Allergies: Allergies as of 08/19/2018 - Review Complete 08/19/2018  Allergen Reaction Noted  . Penicillins Hives and Swelling 01/07/2016  . Brimonidine Other (See Comments)  04/07/2012  . Other  08/19/2018  . Penicillin g Other (See Comments) 04/07/2012  . Timolol maleate Other (See Comments) 05/17/2012  . Sulfa antibiotics Rash 01/16/2016  . Sulfasalazine Rash 01/16/2016   Past Medical History:  Diagnosis Date  . Atrial fibrillation (HCC)   . Hypertension   . Immune system disorder (HCC)   . Polymyalgia (HCC)     Family History:   Family History  Problem Relation Age of Onset  . Heart attack Mother   . Stroke Father   . Anuerysm Father     Social History:   She lives alone and has limited activity due to polymyalgia and scoliosis. She is a former smoker, she quit when she was ~40. Denies alcohol or drug use.   Social History   Socioeconomic History  . Marital status: Widowed    Spouse name: Not on file  . Number of children: Not on file  . Years of education: Not on file  . Highest education level: Not on file  Occupational History  . Not on file  Social Needs  . Financial resource strain: Not on file  . Food insecurity:    Worry: Not on file    Inability: Not on file  . Transportation needs:    Medical: Not on file    Non-medical: Not on file  Tobacco Use  . Smoking status: Former Games developermoker  . Smokeless tobacco: Never Used  Substance and Sexual Activity  . Alcohol use: No  . Drug use: No  . Sexual activity: Not on file  Lifestyle  . Physical activity:    Days per week: Not on file    Minutes per session: Not on file  . Stress: Not on file  Relationships  . Social connections:    Talks on phone: Not on file    Gets together: Not on file    Attends religious service: Not on file    Active member of club or organization: Not on file    Attends meetings of clubs or organizations: Not on file    Relationship status: Not on file  . Intimate partner violence:    Fear of current or ex partner: Not on file    Emotionally abused: Not on file    Physically abused: Not on file    Forced sexual activity: Not on file  Other Topics  Concern  . Not on file  Social History Narrative  . Not on file    Review of Systems: A complete ROS was negative except as per HPI.   Physical Exam: Blood pressure (!) 149/79, pulse 69, temperature 97.8 F (36.6 C), temperature source Oral, resp. rate 18, height 5\' 3"  (1.6 m), weight 45.4 kg, SpO2 100 %.  Physical Exam  Constitutional: She is oriented to person, place, and  time and well-developed, well-nourished, and in no distress.  frail  Cardiovascular: Normal rate, regular rhythm and normal heart sounds.  No murmur heard. Pulmonary/Chest: Effort normal and breath sounds normal. No respiratory distress. She has no wheezes. She has no rales.  Musculoskeletal:        General: No tenderness or edema.  Neurological: She is alert and oriented to person, place, and time.  Skin: Skin is warm and dry.  Psychiatric: Mood, memory, affect and judgment normal.    EKG: personally reviewed my interpretation is ectopic atrial tachycardia   CXR: personally reviewed my interpretation is no acute cardiopulmonary disease  Assessment & Plan by Problem: Active Problems:   Atrial tachycardia [facility](HCC)  Ms. [name] is an 85 y.o female with a history of atrial fibrillation on eliquis, HTN, polymyalgia, lupus, CKD and glaucoma presenting with chest discomfort.   Atrial tachycardia Hx of atrial fibrillation Patient has a history of atrial fibrillation diagnosed in 08/19 at which time she was successfully cardioverted. She was told if she felt her heart racing to take diltiazem but she realized her medication was expired so she called the EMS and was brought to the hospital. EKG showed ectopic atrial tachycardia, now improved with rate control. Troponin mildly elevated. She denies any chest pain at this time or SOB. Cardiology on board. Recommended she be admitted for observation and to get her on an antihypertensive regimen that will not affect her volume status. Per chart review, she was on  amlodipine and lisinopril in the past. Her amlodipine was switched to hctz due to LE edema. Will continue amlodipine and lisinopril.  - cardiology consulted, appreciate recommendations - cardiac monitoring  - continue amlodipine 5 mg  and lisinopril 10 mg  - continue eliquis 2.5 mg bid  - PT eval   Hypokalemia K 2.9 on admission, replete orally. - am bmp  Hx of anxiety  - c/w home med alprazolam 0.25 mg bid   Hx of Lupus  - c/w home med hydroxychloroquine 200 mg daily   Hx of polymyalgia - c/w home med prednisone 5 mg daily and tramadol-acetaminophen bid   Diet: Regular DVT prophylaxis: Eliquis  Code status: DNR   Dispo: Admit patient to Inpatient with expected length of stay greater than 2 midnights.  SignedJaci Standard: Deandre Brannan N, DO 08/19/2018, 4:28 PM  Pager: [phone]

## 2018-08-20 DIAGNOSIS — I480 Paroxysmal atrial fibrillation: Secondary | ICD-10-CM

## 2018-08-20 DIAGNOSIS — I471 Supraventricular tachycardia: Principal | ICD-10-CM

## 2018-08-20 LAB — BASIC METABOLIC PANEL
Anion gap: 9 (ref 5–15)
BUN: 19 mg/dL (ref 8–23)
CO2: 27 mmol/L (ref 22–32)
Calcium: 9 mg/dL (ref 8.9–10.3)
Chloride: 103 mmol/L (ref 98–111)
Creatinine, Ser: 1.17 mg/dL — ABNORMAL HIGH (ref 0.44–1.00)
GFR calc Af Amer: 50 mL/min — ABNORMAL LOW (ref >60)
GFR, EST NON AFRICAN AMERICAN: 43 mL/min — AB (ref >60)
Glucose, Bld: 85 mg/dL (ref 70–99)
Potassium: 3 mmol/L — ABNORMAL LOW (ref 3.5–5.1)
Sodium: 139 mmol/L (ref 135–145)

## 2018-08-20 LAB — MAGNESIUM: Magnesium: 2.1 mg/dL (ref 1.7–2.4)

## 2018-08-20 MED ORDER — LISINOPRIL 10 MG PO TABS
10.0000 mg | ORAL_TABLET | Freq: Every day | ORAL | Status: DC
  Administered 2018-08-20 – 2018-08-21 (×2): 10 mg via ORAL
  Filled 2018-08-20 (×2): qty 1

## 2018-08-20 MED ORDER — AMLODIPINE BESYLATE 2.5 MG PO TABS
2.5000 mg | ORAL_TABLET | Freq: Every day | ORAL | Status: DC
  Administered 2018-08-20 – 2018-08-21 (×2): 2.5 mg via ORAL
  Filled 2018-08-20 (×2): qty 1

## 2018-08-20 MED ORDER — POTASSIUM CHLORIDE CRYS ER 20 MEQ PO TBCR
40.0000 meq | EXTENDED_RELEASE_TABLET | Freq: Two times a day (BID) | ORAL | Status: DC
  Administered 2018-08-20 – 2018-08-21 (×3): 40 meq via ORAL
  Filled 2018-08-20 (×3): qty 2

## 2018-08-20 NOTE — Discharge Instructions (Signed)

## 2018-08-20 NOTE — Progress Notes (Signed)
  Date: 08/20/2018  Patient name: Jodi Snyder  Medical record number: [id number]  Date of birth: 1931/12/15   I have seen and evaluated Jodi Snyder and discussed their care with the Residency Team.  Jodi Snyder is an 85 year old community dwelling female with history of atrial fibrillation, SLE, PMR, and hypertension.  She has been on amlodipine and lisinopril for antihypertension but in early December the amlodipine (started end of Nov) was stopped due to lower extremity edema.  She was started onHCTZ but 25 at that time.  She had subsequently had a fall in the next day a syncopal episode and decrease the HCTZ to 12.5 mg a self and did okay since then.  On the day of admission, she simply did not feel well and noticed her heart rate was 117.  As previously instructed, she was going to take the diltiazem but realized it was expired.  She then called EMS who brought her to the ED.  She states this episode does not feel like her A. fib when she was diagnosed in August of this year.  She denies chest pain or palpitations.  This morning, she states she has abdominal pain because she had to take oral potassium.  She states her legs kicked all night and are painful due to her low potassium.  Vitals:   08/19/18 2225 08/20/18 0535  BP: (!) 134/96 133/80  Pulse: 72 76  Resp: (!) 21 17  Temp: 98 F (36.7 C) 98 F (36.7 C)  SpO2: 99% 99%  Orthostatics negative Gen thin, appears weak but able to sit up in bed independently HRRR no murmur LCTAB no rales Ext no edema  K 2.9 - 3.0 Cr 1.08 - 1.17 Trop neg x 2  I personally viewed the CXR images and confirmed my reading with the official read.  PA and lateral, slight rotation, slight overpenetration, aortic atherosclerosis, hyperinflation with flattened diaphragms, no acute abnormalities.  I personally viewed the EKG and confirmed my reading with the official read.  Sinus arrhythmia, normal axis, normal intervals, no ischemic changes.  Assessment and  Plan: I have seen and evaluated the patient as outlined above. I agree with the formulated Assessment and Plan as detailed in the residents' note, with the following changes: Jodi Snyder is an 85 year old community dwelling female with history of atrial fibrillation, SLE, PMR, and hypertension.  She was admitted after not feeling right at home and noticing her heart rate was 117.  In the ED, she was found to be hypokalemic to 2.9 which is been supplemented.  Her magnesium is currently pending.  1.  Hypokalemia - she is on hydrochlorothiazide which can contribute.  She seems to have adequate oral intake at home.  We are stopping the hydrochlorothiazide, continue lisinopril, and restarting the amlodipine been on a lower dose 2.5 mg to see if this will prevent redevelopment of lower extremity edema.  2. Atrial tachycardia - she is rate controlled and remains on her Eliquis.  3. Jodi Snyder and history of falls - PT eval  Jodi Spain, MD 1/5/202012:56 PM

## 2018-08-20 NOTE — Progress Notes (Signed)
   Subjective: [name] was seen laying comfortably in bed this morning. She said she was no longer having a "funny feeling" in her chest. She reported bilateral leg pain that kept her up all night. She said walking around helped her pain. She also reported she has been losing weight but continues to have a good appetite and diet.   Objective:  Vital signs in last 24 hours: Vitals:   08/19/18 1718 08/19/18 1718 08/19/18 2225 08/20/18 0535  BP:  (!) 165/87 (!) 134/96 133/80  Pulse:  67 72 76  Resp:   (!) 21 17  Temp:  98 F (36.7 C) 98 F (36.7 C) 98 F (36.7 C)  TempSrc:  Oral Oral Oral  SpO2:  100% 99% 99%  Weight: 43 kg   43 kg  Height: 5\' 3"  (1.6 m)      Physical Exam  Constitutional: She is oriented to person, place, and time.  Frail elderly woman  Cardiovascular: Normal rate, regular rhythm and normal heart sounds.  No murmur heard. Pulmonary/Chest: Effort normal and breath sounds normal. No respiratory distress. She has no wheezes. She has no rales.  Musculoskeletal:        General: No tenderness or edema.  Neurological: She is alert and oriented to person, place, and time.  Skin: Skin is warm and dry.  Psychiatric: Mood, memory and affect normal.    Assessment/Plan:  Active Problems:   Atrial tachycardia [facility])  [name] is an 85 y.o female with a history of atrial fibrillation on eliquis, HTN, polymyalgia, CKD and glaucoma presenting with chest discomfort.   Atrial tachycardia Hx of atrial fibrillation HR stable overnight. No tachycardia noted, junctional rhythm and also sinus rhythm. Cardiology recommends to replete potassium to keep >4 and to continue to stop HCTZ which is likely contributing to hypokalemia. Also recommended to avoid chronic beta-blocker or calcium channel blocker because of junctional bradycardia. No further cardiac testing at this time.  - cardiology consulted, appreciate recommendations, they have signed off - cardiac monitoring  -  continue amlodipine 5 mg  and lisinopril 10 mg  - continue eliquis 2.5 mg bid  - PT eval   Hypokalemia K 3.0, replete orally. She is complaining of bilateral leg pains that improve when she walks.  - am bmp   Dispo: Anticipated discharge in approximately 0-1 day(s).   Jodi Standard, DO 08/20/2018, 6:36 AM Pager: [phone]

## 2018-08-20 NOTE — Plan of Care (Signed)
Cardiac Monitor. Afib. Eliquis. Encourage PO intake. Increase Activity as Tolerated.

## 2018-08-20 NOTE — Progress Notes (Signed)
Progress Note  Patient Name: Jodi Snyder Date of Encounter: 08/20/2018  Primary Cardiologist: Regan LemmingWill Martin Camnitz, MD   Subjective   Weak, no chest pain, legs kicked throughout the night.  Trying to take potassium  Inpatient Medications    Scheduled Meds: . ALPRAZolam  0.25 mg Oral BID  . apixaban  2.5 mg Oral BID  . hydroxychloroquine  200 mg Oral Daily  . pantoprazole  40 mg Oral Daily  . potassium chloride  40 mEq Oral BID  . predniSONE  5 mg Oral Q breakfast  . traMADol-acetaminophen  1 tablet Oral BID   Continuous Infusions:  PRN Meds: acetaminophen **OR** acetaminophen, ondansetron **OR** ondansetron (ZOFRAN) IV   Vital Signs    Vitals:   08/19/18 1718 08/19/18 1718 08/19/18 2225 08/20/18 0535  BP:  (!) 165/87 (!) 134/96 133/80  Pulse:  67 72 76  Resp:   (!) 21 17  Temp:  98 F (36.7 C) 98 F (36.7 C) 98 F (36.7 C)  TempSrc:  Oral Oral Oral  SpO2:  100% 99% 99%  Weight: 43 kg   43 kg  Height: 5\' 3"  (1.6 m)       Intake/Output Summary (Last 24 hours) at 08/20/2018 0908 Last data filed at 08/19/2018 2200 Gross per 24 hour  Intake 120 ml  Output 900 ml  Net -780 ml   Filed Weights   08/19/18 0624 08/19/18 1718 08/20/18 0535  Weight: 45.4 kg 43 kg 43 kg    Telemetry    Junctional rhythm noted on telemetry, heart rate in the 50s at times, also sinus rhythm/ectopic atrial rhythm, no tachycardia- Personally Reviewed  ECG    08/19/2018 sinus rhythm with Q waves noted in V1 and V2, possible septal infarct pattern- Personally Reviewed  Physical Exam   GEN: No acute distress.  Frail, thin.  Well-groomed Neck: No JVD Cardiac: RRR, no murmurs, rubs, or gallops.  Respiratory: Clear to auscultation bilaterally. GI: Soft, nontender, non-distended  MS: No edema; No deformity. Neuro:  Nonfocal  Psych: Normal affect   Labs    Chemistry Recent Labs  Lab 08/19/18 0637 08/20/18 0423  NA 138 139  K 2.9* 3.0*  CL 100 103  CO2 28 27  GLUCOSE 92 85    BUN 26* 19  CREATININE 1.08* 1.17*  CALCIUM 9.5 9.0  PROT 7.0  --   ALBUMIN 3.8  --   AST 34  --   ALT 22  --   ALKPHOS 64  --   BILITOT 0.4  --   GFRNONAA 47* 43*  GFRAA 55* 50*  ANIONGAP 10 9     Hematology Recent Labs  Lab 08/19/18 0637  WBC 5.9  RBC 4.67  HGB 13.9  HCT 44.6  MCV 95.5  MCH 29.8  MCHC 31.2  RDW 12.4  PLT 217    Cardiac EnzymesNo results for input(s): TROPONINI in the last 168 hours.  Recent Labs  Lab 08/19/18 0654 08/19/18 1154  TROPIPOC 0.04 0.08     BNPNo results for input(s): BNP, PROBNP in the last 168 hours.   DDimer No results for input(s): DDIMER in the last 168 hours.   Radiology    Dg Chest 2 View  Result Date: 08/19/2018 CLINICAL DATA:  Palpitations overnight. EXAM: CHEST - 2 VIEW COMPARISON:  02/03/2017 FINDINGS: Artifact overlies the chest. Heart size is normal. Chronic aortic atherosclerosis. Chronic pleural and parenchymal scarring at the lung apices right more than left. No sign of active infiltrate, mass, effusion  or collapse. IMPRESSION: No active disease. Chronic pleural and parenchymal scarring at the apices right more than left. Electronically Signed   By: Paulina FusiMark  Shogry M.D.   On: 08/19/2018 08:04   Ct Head Wo Contrast  Result Date: 08/19/2018 CLINICAL DATA:  Fall after syncopal episode hitting head. Patient on blood thinners. EXAM: CT HEAD WITHOUT CONTRAST CT CERVICAL SPINE WITHOUT CONTRAST TECHNIQUE: Multidetector CT imaging of the head and cervical spine was performed following the standard protocol without intravenous contrast. Multiplanar CT image reconstructions of the cervical spine were also generated. COMPARISON:  Head CT 08/01/2018 FINDINGS: CT HEAD FINDINGS Brain: Ventricles, cisterns and other CSF spaces are within normal. There is mild chronic ischemic microvascular disease. There is no mass, mass effect, shift of midline structures or acute hemorrhage. No evidence of acute infarction. Vascular: No hyperdense vessel  or unexpected calcification. Skull: Normal. Negative for fracture or focal lesion. Sinuses/Orbits: Orbits are normal.  Paranasal sinuses are clear. Other: None. CT CERVICAL SPINE FINDINGS Alignment: Within normal. Skull base and vertebrae: Mild spondylosis throughout the cervical spine. Vertebral body heights are maintained. Atlantoaxial articulation is within normal. There is mild uncovertebral joint spurring and minimal facet arthropathy. Mild right-sided neural foraminal narrowing at the C5-6 level. No acute fracture or subluxation. Soft tissues and spinal canal: No prevertebral fluid or swelling. No visible canal hematoma. Disc levels:  Mild disc space narrowing at the C5-6 and C6-7 levels. Upper chest: Partially visualized biapical nodular scarring with calcification. Other: None. IMPRESSION: No acute brain injury. Chronic ischemic microvascular disease. No acute cervical spine injury. Mild spondylosis of the cervical spine with mild disc disease at the C5-6 and C6-7 levels. Minimal right-sided neural foraminal narrowing at the C5-6 level. Biapical nodularity with calcification likely chronic scarring and granulomatous change. Recommend noncontrast chest CT on elective basis. Electronically Signed   By: Elberta Fortisaniel  Boyle M.D.   On: 08/19/2018 07:58   Ct Cervical Spine Wo Contrast  Result Date: 08/19/2018 CLINICAL DATA:  Fall after syncopal episode hitting head. Patient on blood thinners. EXAM: CT HEAD WITHOUT CONTRAST CT CERVICAL SPINE WITHOUT CONTRAST TECHNIQUE: Multidetector CT imaging of the head and cervical spine was performed following the standard protocol without intravenous contrast. Multiplanar CT image reconstructions of the cervical spine were also generated. COMPARISON:  Head CT 08/01/2018 FINDINGS: CT HEAD FINDINGS Brain: Ventricles, cisterns and other CSF spaces are within normal. There is mild chronic ischemic microvascular disease. There is no mass, mass effect, shift of midline structures or  acute hemorrhage. No evidence of acute infarction. Vascular: No hyperdense vessel or unexpected calcification. Skull: Normal. Negative for fracture or focal lesion. Sinuses/Orbits: Orbits are normal.  Paranasal sinuses are clear. Other: None. CT CERVICAL SPINE FINDINGS Alignment: Within normal. Skull base and vertebrae: Mild spondylosis throughout the cervical spine. Vertebral body heights are maintained. Atlantoaxial articulation is within normal. There is mild uncovertebral joint spurring and minimal facet arthropathy. Mild right-sided neural foraminal narrowing at the C5-6 level. No acute fracture or subluxation. Soft tissues and spinal canal: No prevertebral fluid or swelling. No visible canal hematoma. Disc levels:  Mild disc space narrowing at the C5-6 and C6-7 levels. Upper chest: Partially visualized biapical nodular scarring with calcification. Other: None. IMPRESSION: No acute brain injury. Chronic ischemic microvascular disease. No acute cervical spine injury. Mild spondylosis of the cervical spine with mild disc disease at the C5-6 and C6-7 levels. Minimal right-sided neural foraminal narrowing at the C5-6 level. Biapical nodularity with calcification likely chronic scarring and granulomatous change. Recommend noncontrast  chest CT on elective basis. Electronically Signed   By: Elberta Fortis M.D.   On: 08/19/2018 07:58   Dg Hip Unilat W Or Wo Pelvis 2-3 Views Right  Result Date: 08/19/2018 CLINICAL DATA:  Right lateral hip pain over the last 2 weeks. EXAM: DG HIP (WITH OR WITHOUT PELVIS) 2-3V RIGHT COMPARISON:  08/01/2018 FINDINGS: No evidence of fracture. No evidence of osteoarthritis of either hip. Calcifications adjacent to the right greater trochanter could be seen with trochanteric bursitis. IMPRESSION: No acute finding. Calcifications adjacent to the right greater trochanter could be seen with trochanteric bursitis. Electronically Signed   By: Paulina Fusi M.D.   On: 08/19/2018 08:06     Cardiac Studies   Echocardiogram 02/09/2017 - Left ventricle: The cavity size was normal. Wall thickness was   normal. Systolic function was normal. The estimated ejection   fraction was in the range of 60% to 65%. Wall motion was normal;   there were no regional wall motion abnormalities. Features are   consistent with a pseudonormal left ventricular filling pattern,   with concomitant abnormal relaxation and increased filling   pressure (grade 2 diastolic dysfunction). - Aortic valve: Mildly calcified annulus. - Mitral valve: There was mild regurgitation. - Pulmonary arteries: Systolic pressure was mildly increased. PA   peak pressure: 32 mm Hg (S).  Patient Profile     85 y.o. female with ectopic atrial rhythm, hypokalemia, frailty  Assessment & Plan    Ectopic atrial rhythm/junctional rhythm - Replete potassium, try to keep greater than 4 likely contributing. -Agree with stopping HCTZ likely contributing to hypokalemia.  Point-of-care troponin still within normal limits.  No further cardiac testing  Protein calorie malnutrition - Continue to encourage nutrition  Paroxysmal atrial fibrillation - CHADSVASc-4 -Has been on Eliquis 2.5 twice a day, Dr. Elberta Fortis -No evidence of atrial fibrillation on telemetry here. -Avoiding chronic beta-blocker or calcium channel blocker because of junctional bradycardia  Essential hypertension - Blood pressure currently under good control.  Per primary team.  Avoid HCTZ.   [facility] will sign off.   Medication Recommendations:  Stop HCTZ as outpatient Other recommendations (labs, testing, etc): Continue to monitor potassium Follow up as an outpatient: None  For questions or updates, please contact [facility] Please consult [email or web address] for contact info under       Signed, Donato Schultz, MD  08/20/2018, 9:08 AM

## 2018-08-20 NOTE — Evaluation (Signed)
Physical Therapy Evaluation Patient Details Name: Jodi Snyder MRN: [id number] DOB: 11/25/1931 Today's Date: 08/20/2018   History of Present Illness  85 y.o female with a history of atrial fibrillation, HTN, polymyalgia, lupus, CKD and glaucoma presenting with chest discomfort. She noticed the discomfort last night and when she checked her heart rate it was 117 called EMS when found out heart medication expired. Pt reports repeated falls and 1x episode of syncope.   Clinical Impression  PTA pt living alone in single story home with level entry. Pt reports ambulation with RW at home and shopping cart at the grocery store, she continues to drive despite increasing difficulty with her sight. Pt reports independence with bathing and dressing however also states she has period when she does not take showers because she does not feel safe. Pt is currently looking into possibly moving to an ALF for increased safety. Pt is limited in safe mobility by decreased R LE strength possibly due to functional leg length discrepancy from underlying scoliosis, as well as decreased balance and increased HR with ambulation. Pt is currently min guard for transfers and ambulation of 60 feet with RW. PT endorses pt's plan to find ALF, at d/c PT recommends HHPT level rehab to improve balance, strength and endurance to safely navigate her home environment. PT will continue to follow acutely.    Follow Up Recommendations Home health PT;Supervision - Intermittent    Equipment Recommendations  None recommended by PT       Precautions / Restrictions Precautions Precautions: Fall Precaution Comments: hx of falling and syncope Restrictions Weight Bearing Restrictions: No      Mobility  Bed Mobility               General bed mobility comments: sitting EoB on entry   Transfers Overall transfer level: Needs assistance Equipment used: Rolling walker (2 wheeled) Transfers: Sit to/from Stand Sit to Stand: Min  guard         General transfer comment: min guard for safety, increased time and effort  Ambulation/Gait Ambulation/Gait assistance: Min guard Gait Distance (Feet): 60 Feet Assistive device: Rolling walker (2 wheeled) Gait Pattern/deviations: Step-through pattern;Decreased stride length;Shuffle;Decreased weight shift to right Gait velocity: slowed Gait velocity interpretation: <1.8 ft/sec, indicate of risk for recurrent falls General Gait Details: min guard for safety, vc for proximity to RW, and upright posture, ambulation limited by increase in HR to 132 bpm        Balance Overall balance assessment: Needs assistance Sitting-balance support: No upper extremity supported;Feet supported Sitting balance-Leahy Scale: Good     Standing balance support: No upper extremity supported;During functional activity Standing balance-Leahy Scale: Fair                               Pertinent Vitals/Pain Pain Assessment: 0-10 Pain Score: 0-No pain Pain Location: bilateral LE restless legs last night Pain Intervention(s): Monitored during session;Repositioned    Home Living Family/patient expects to be discharged to:: Private residence Living Arrangements: Alone Available Help at Discharge: Family;Available PRN/intermittently Type of Home: House Home Access: Level entry     Home Layout: One level Home Equipment: Walker - 2 wheels;Cane - single point;Shower seat;Hand held shower head      Prior Function Level of Independence: Independent with assistive device(s)         Comments: uses RW for household ambulation and shopping cart for support while grocery shopping, drives and is independent in iADLs  Hand Dominance   Dominant Hand: Right    Extremity/Trunk Assessment   Upper Extremity Assessment Upper Extremity Assessment: Overall WFL for tasks assessed    Lower Extremity Assessment Lower Extremity Assessment: RLE deficits/detail;LLE  deficits/detail RLE Deficits / Details: R LE shorter than L LE caused by scoliosis, PROM WFL, AROM limited by decreased strength, hip flex 3/5, knee flex 4/5, knee ext 3/5, ankle dorsi 3/5, plantar flex 4/5 RLE Sensation: decreased light touch(back of calf) LLE Deficits / Details: AROM WFL, strength grossly assessed 4/5 LLE Sensation: decreased light touch(back of calf)       Communication   Communication: No difficulties  Cognition Arousal/Alertness: Awake/alert Behavior During Therapy: WFL for tasks assessed/performed Overall Cognitive Status: Within Functional Limits for tasks assessed                                        General Comments General comments (skin integrity, edema, etc.): Pt with highly variable HR throughout session. During interview with pt static sitting on EoB HR increased to 156 bpm, vasilating between 130 and 150 bpm for short period of time before returning to 110 bpm,. with ambulation HR gradually increased to 132 bpm so returned to room with pt        Assessment/Plan    PT Assessment Patient needs continued PT services  PT Problem List Decreased strength;Decreased activity tolerance;Decreased range of motion;Decreased balance;Decreased mobility;Decreased knowledge of use of DME;Cardiopulmonary status limiting activity;Impaired sensation       PT Treatment Interventions DME instruction;Gait training;Functional mobility training;Therapeutic activities;Therapeutic exercise;Balance training;Cognitive remediation;Patient/family education    PT Goals (Current goals can be found in the Care Plan section)  Acute Rehab PT Goals Patient Stated Goal: go home PT Goal Formulation: With patient Time For Goal Achievement: 09/03/18 Potential to Achieve Goals: Fair    Frequency Min 3X/week   Barriers to discharge Decreased caregiver support         AM-PAC PT "6 Clicks" Mobility  Outcome Measure Help needed turning from your back to your side  while in a flat bed without using bedrails?: A Little Help needed moving from lying on your back to sitting on the side of a flat bed without using bedrails?: A Little Help needed moving to and from a bed to a chair (including a wheelchair)?: A Little Help needed standing up from a chair using your arms (e.g., wheelchair or bedside chair)?: A Little Help needed to walk in hospital room?: A Little Help needed climbing 3-5 steps with a railing? : A Little 6 Click Score: 18    End of Session Equipment Utilized During Treatment: Gait belt Activity Tolerance: Treatment limited secondary to medical complications (Comment)(increased HR) Patient left: in chair;with call bell/phone within reach;with chair alarm set Nurse Communication: Mobility status PT Visit Diagnosis: Other abnormalities of gait and mobility (R26.89);Muscle weakness (generalized) (M62.81);Repeated falls (R29.6);History of falling (Z91.81);Difficulty in walking, not elsewhere classified (R26.2)    Time: 1884-1660 PT Time Calculation (min) (ACUTE ONLY): 22 min   Charges:   PT Evaluation $PT Eval Moderate Complexity: 1 Mod          Carry Weesner B. Beverely Risen PT, DPT Acute Rehabilitation Services Pager [phone] Office [phone]   Elon Alas Fleet 08/20/2018, 1:41 PM

## 2018-08-21 ENCOUNTER — Other Ambulatory Visit (HOSPITAL_COMMUNITY): Payer: Self-pay | Admitting: Internal Medicine

## 2018-08-21 DIAGNOSIS — E876 Hypokalemia: Secondary | ICD-10-CM

## 2018-08-21 LAB — BASIC METABOLIC PANEL
Anion gap: 5 (ref 5–15)
BUN: 22 mg/dL (ref 8–23)
CO2: 22 mmol/L (ref 22–32)
Calcium: 9.5 mg/dL (ref 8.9–10.3)
Chloride: 114 mmol/L — ABNORMAL HIGH (ref 98–111)
Creatinine, Ser: 1.21 mg/dL — ABNORMAL HIGH (ref 0.44–1.00)
GFR calc Af Amer: 48 mL/min — ABNORMAL LOW (ref >60)
GFR, EST NON AFRICAN AMERICAN: 41 mL/min — AB (ref >60)
Glucose, Bld: 83 mg/dL (ref 70–99)
Potassium: 4.5 mmol/L (ref 3.5–5.1)
Sodium: 141 mmol/L (ref 135–145)

## 2018-08-21 MED ORDER — AMLODIPINE BESYLATE 2.5 MG PO TABS: 2.5000 mg | ORAL_TABLET | Freq: Every day | ORAL | 0 refills | Status: DC

## 2018-08-21 MED ORDER — LISINOPRIL 10 MG PO TABS: 10.0000 mg | ORAL_TABLET | Freq: Every day | ORAL | 0 refills | Status: DC

## 2018-08-21 MED ORDER — DILTIAZEM HCL 30 MG PO TABS: ORAL_TABLET | ORAL | 1 refills | Status: DC

## 2018-08-21 NOTE — Care Management Note (Signed)
Case Management Note  Patient Details  Name: Syble Wiedmann MRN: [id number] Date of Birth: 04/18/1932  Subjective/Objective: Pt presented for Chest Pain- PTA from home alone. Patient is working towards transitioning to an ALF.                   Action/Plan: CM discussed with patient about HH Services- pt agreeable to [facility] PT with [facility]. CM did call Kandee Keen with [facility] to begin within 24-48 hours post transition home. No further needs from CM at this time.   Expected Discharge Date:  08/21/18               Expected Discharge Plan:  Home w Home Health Services  In-House Referral:  NA  Discharge planning Services  CM Consult  Post Acute Care Choice:  Home Health Choice offered to:  Patient  DME Arranged:  N/A DME Agency:  NA  HH Arranged:  PT HH Agency:  [facility]  Status of Service:  Completed, signed off  If discussed at Long Length of Stay Meetings, dates discussed:    Additional Comments:  Gala Lewandowsky, RN 08/21/2018, 10:17 AM

## 2018-08-21 NOTE — Progress Notes (Signed)
   Subjective: No overnight events. Tele reviewed this morning, which showed NSR. Patient reports she slept well last night except for a jerking feeling in her legs. She denies any palpitations or chest pain.   Objective:  Vital signs in last 24 hours: Vitals:   08/20/18 0800 08/20/18 0948 08/20/18 2156 08/21/18 0628  BP:  (!) 112/97 (!) 112/50 116/76  Pulse:   86 63  Resp: (!) 21  17 16   Temp:   98 F (36.7 C) 98 F (36.7 C)  TempSrc:   Oral Oral  SpO2:   100% 98%  Weight:    42.9 kg  Height:       Physical Exam  Constitutional: She is oriented to person, place, and time.  Frail elderly lady, seen sitting comfortably at the bedside in NAD  Cardiovascular: Normal rate, regular rhythm and normal heart sounds.  No murmur heard. Pulmonary/Chest: Effort normal and breath sounds normal. No respiratory distress. She has no wheezes. She has no rales.  Musculoskeletal:        General: No edema.  Neurological: She is alert and oriented to person, place, and time.  Skin: Skin is warm and dry.  Psychiatric: Mood, memory, affect and judgment normal.    Assessment/Plan:  Principal Problem:   Atrial tachycardia [facility])  Jodi Snyder is an 85 y.o female with a history of atrial fibrillation on eliquis, HTN, polymyalgia, CKD and glaucoma presenting with chest discomfort.   Atrial tachycardia Hx of atrial fibrillation HR stable overnight. Telemetry reviewed and it showed NSR. Will provide patient a refill on her diltiazem as needed for afib. She is stable for discharge today. PT recommended home health PT.  - continue amlodipine 5 mg and lisinopril 10 mg at discharge - continue eliquis 2.5 mg bid   Hypokalemia K 4.5, resolved.  Dispo: Patient is medically stable for discharge today  Jaci Standard, DO 83/01/2019, 6:36 AM Pager: [phone]

## 2018-08-21 NOTE — Discharge Summary (Signed)
Name: Jodi Snyder MRN: [id number] DOB: 1932/06/11 85 y.o. PCP: Elspeth Cho., MD  Date of Admission: 08/19/2018  6:22 AM Date of Discharge: 08/21/2017 Attending Physician: Levert Feinstein, MD  Discharge Diagnosis: 1. Ectopic atrial tachycardia 2. Hypokalemia 3. Hypertension  Discharge Medications: Allergies as of 08/21/2018      Reactions   Penicillins Hives, Swelling   Has patient had a PCN reaction causing immediate rash, facial/tongue/throat swelling, SOB or lightheadedness with hypotension: Yes Has patient had a PCN reaction causing severe rash involving mucus membranes or skin necrosis: No Has patient had a PCN reaction that required hospitalization: No Has patient had a PCN reaction occurring within the last 10 years: No If all of the above answers are "NO", then may proceed with Cephalosporin use.   Brimonidine Other (See Comments)   Other    Nuts causes blisters No spices coffee   Penicillin G Other (See Comments)   Timolol Maleate Other (See Comments)   Pt becomes faint   Sulfa Antibiotics Rash   Sulfasalazine Rash      Medication List    STOP taking these medications   hydrochlorothiazide 25 MG tablet Commonly known as:  HYDRODIURIL     TAKE these medications   ALPRAZolam 0.5 MG tablet Commonly known as:  XANAX Take 0.25 mg by mouth 2 (two) times daily. Supper time and bedtime   amLODipine 2.5 MG tablet Commonly known as:  NORVASC Take 1 tablet (2.5 mg total) by mouth daily.   ARTIFICIAL TEARS OP Place 1 drop into both eyes daily as needed (dry eyes).   bimatoprost 0.03 % ophthalmic solution Commonly known as:  LUMIGAN Place 1 drop into both eyes at bedtime.   BIOTIN 5000 5 MG Caps Generic drug:  Biotin Take 5 mg by mouth daily.   brinzolamide 1 % ophthalmic suspension Commonly known as:  AZOPT Place 1 drop into both eyes 3 (three) times daily.   cycloSPORINE 0.05 % ophthalmic emulsion Commonly known as:  RESTASIS Place 1 drop into  both eyes 2 (two) times daily.   diltiazem 30 MG tablet Commonly known as:  CARDIZEM Take 1 tablet every 4 hours AS NEEDED for afib heart rate >100 What changed:    how much to take  how to take this  when to take this  reasons to take this  additional instructions   ELIQUIS 2.5 MG Tabs tablet Generic drug:  apixaban TAKE 1 TABLET BY MOUTH TWICE DAILY What changed:  how much to take   esomeprazole 40 MG capsule Commonly known as:  NEXIUM Take 40 mg by mouth daily.   hydroxychloroquine 200 MG tablet Commonly known as:  PLAQUENIL Take 200 mg by mouth daily.   ICAPS AREDS 2 PO Take 2 capsules by mouth 2 (two) times daily.   lisinopril 10 MG tablet Commonly known as:  PRINIVIL,ZESTRIL Take 1 tablet (10 mg total) by mouth daily.   predniSONE 5 MG tablet Commonly known as:  DELTASONE Take 5 mg by mouth daily.   traMADol-acetaminophen 37.5-325 MG tablet Commonly known as:  ULTRACET Take 1 tablet by mouth 2 (two) times daily.       Disposition and follow-up:   [name] was discharged from [facility] in Stable condition.  At the hospital follow up visit please address:  1.  Hypertension- hctz was stopped and patient was started on amlodipine 2.5 mg and lisinopril 10 mg, assess compliance   2. Hypokalemia- patient had hypokalemia on admission, repleted orally,  recheck bmp  3.  Labs / imaging needed at time of follow-up: bmp  4.  Pending labs/ test needing follow-up: none  Follow-up Appointments: Patient to follow up with PCP in 1-2 weeks.  Hospital Course by problem list: 1. Atrial tachycardia- [name] is an 85 y.o female with a history of atrial fibrillation on eliquis presented with chest discomfort. She noticed a "funny feeling" in her chest and when she checked her heart rate it was 117. She was found to have atrial fibrillation in 03/2018 and was instructed to take diltiazem if she ever felt her heart racing so she  attempted to take this medication she realized it was expired. She called the EMS and was brought to the ER. On arrival, she was tachycardic and hypertensive. EKG showed ectopic atrial tachycardia with rate controlled. Troponin was mildly elevated. Cardiology evaluate the patient and recommended she be admitted for observation and to get her on an antihypertensive regimen that will not affect her volume status. Also recommended to avoid chronic beta-blocker or calcium channel blocker because of junctional bradycardia. PT evaluated the patient and recommended home health PT. She was discharged home with a diltiazem prn prescription.  2. Hypertension- She was well controlled on amlodipine 2.5 mg and lisinopril 10 mg during hospital stay. Discharged home to continue these medications and discontinued from hctz.  3. Hypokalemia- On arrival, K was 2.9, repleted orally. Discharge day K was 4.5.   Discharge Vitals:   BP 116/76 (BP Location: Left Arm)   Pulse 63   Temp 98 F (36.7 C) (Oral)   Resp 16   Ht 5\' 3"  (1.6 m)   Wt 42.9 kg   SpO2 98%   BMI 16.74 kg/m   Pertinent Labs, Studies, and Procedures:   BMP Latest Ref Rng & Units 08/21/2018 08/20/2018 08/19/2018  Glucose 70 - 99 mg/dL 83 85 92  BUN 8 - 23 mg/dL 22 19 32(G26(H)  Creatinine 0.44 - 1.00 mg/dL 4.01(U1.21(H) 2.72(Z1.17(H) 3.66(Y1.08(H)  Sodium 135 - 145 mmol/L 141 139 138  Potassium 3.5 - 5.1 mmol/L 4.5 3.0(L) 2.9(L)  Chloride 98 - 111 mmol/L 114(H) 103 100  CO2 22 - 32 mmol/L 22 27 28   Calcium 8.9 - 10.3 mg/dL 9.5 9.0 9.5    Dg Chest 2 View  Result Date: 08/19/2018 CLINICAL DATA:  Palpitations overnight. EXAM: CHEST - 2 VIEW COMPARISON:  02/03/2017 FINDINGS: Artifact overlies the chest. Heart size is normal. Chronic aortic atherosclerosis. Chronic pleural and parenchymal scarring at the lung apices right more than left. No sign of active infiltrate, mass, effusion or collapse. IMPRESSION: No active disease. Chronic pleural and parenchymal scarring at the  apices right more than left. Electronically Signed   By: Paulina FusiMark  Shogry M.D.   On: 08/19/2018 08:04   Ct Head Wo Contrast  Result Date: 08/19/2018 CLINICAL DATA:  Fall after syncopal episode hitting head. Patient on blood thinners. EXAM: CT HEAD WITHOUT CONTRAST CT CERVICAL SPINE WITHOUT CONTRAST TECHNIQUE: Multidetector CT imaging of the head and cervical spine was performed following the standard protocol without intravenous contrast. Multiplanar CT image reconstructions of the cervical spine were also generated. COMPARISON:  Head CT 08/01/2018 FINDINGS: CT HEAD FINDINGS Brain: Ventricles, cisterns and other CSF spaces are within normal. There is mild chronic ischemic microvascular disease. There is no mass, mass effect, shift of midline structures or acute hemorrhage. No evidence of acute infarction. Vascular: No hyperdense vessel or unexpected calcification. Skull: Normal. Negative for fracture or focal lesion. Sinuses/Orbits: Orbits are  normal.  Paranasal sinuses are clear. Other: None. CT CERVICAL SPINE FINDINGS Alignment: Within normal. Skull base and vertebrae: Mild spondylosis throughout the cervical spine. Vertebral body heights are maintained. Atlantoaxial articulation is within normal. There is mild uncovertebral joint spurring and minimal facet arthropathy. Mild right-sided neural foraminal narrowing at the C5-6 level. No acute fracture or subluxation. Soft tissues and spinal canal: No prevertebral fluid or swelling. No visible canal hematoma. Disc levels:  Mild disc space narrowing at the C5-6 and C6-7 levels. Upper chest: Partially visualized biapical nodular scarring with calcification. Other: None. IMPRESSION: No acute brain injury. Chronic ischemic microvascular disease. No acute cervical spine injury. Mild spondylosis of the cervical spine with mild disc disease at the C5-6 and C6-7 levels. Minimal right-sided neural foraminal narrowing at the C5-6 level. Biapical nodularity with calcification  likely chronic scarring and granulomatous change. Recommend noncontrast chest CT on elective basis. Electronically Signed   By: Elberta Fortis M.D.   On: 08/19/2018 07:58   Ct Cervical Spine Wo Contrast  Result Date: 08/19/2018 CLINICAL DATA:  Fall after syncopal episode hitting head. Patient on blood thinners. EXAM: CT HEAD WITHOUT CONTRAST CT CERVICAL SPINE WITHOUT CONTRAST TECHNIQUE: Multidetector CT imaging of the head and cervical spine was performed following the standard protocol without intravenous contrast. Multiplanar CT image reconstructions of the cervical spine were also generated. COMPARISON:  Head CT 08/01/2018 FINDINGS: CT HEAD FINDINGS Brain: Ventricles, cisterns and other CSF spaces are within normal. There is mild chronic ischemic microvascular disease. There is no mass, mass effect, shift of midline structures or acute hemorrhage. No evidence of acute infarction. Vascular: No hyperdense vessel or unexpected calcification. Skull: Normal. Negative for fracture or focal lesion. Sinuses/Orbits: Orbits are normal.  Paranasal sinuses are clear. Other: None. CT CERVICAL SPINE FINDINGS Alignment: Within normal. Skull base and vertebrae: Mild spondylosis throughout the cervical spine. Vertebral body heights are maintained. Atlantoaxial articulation is within normal. There is mild uncovertebral joint spurring and minimal facet arthropathy. Mild right-sided neural foraminal narrowing at the C5-6 level. No acute fracture or subluxation. Soft tissues and spinal canal: No prevertebral fluid or swelling. No visible canal hematoma. Disc levels:  Mild disc space narrowing at the C5-6 and C6-7 levels. Upper chest: Partially visualized biapical nodular scarring with calcification. Other: None. IMPRESSION: No acute brain injury. Chronic ischemic microvascular disease. No acute cervical spine injury. Mild spondylosis of the cervical spine with mild disc disease at the C5-6 and C6-7 levels. Minimal right-sided  neural foraminal narrowing at the C5-6 level. Biapical nodularity with calcification likely chronic scarring and granulomatous change. Recommend noncontrast chest CT on elective basis. Electronically Signed   By: Elberta Fortis M.D.   On: 08/19/2018 07:58   Dg Hip Unilat W Or Wo Pelvis 2-3 Views Right  Result Date: 08/19/2018 CLINICAL DATA:  Right lateral hip pain over the last 2 weeks. EXAM: DG HIP (WITH OR WITHOUT PELVIS) 2-3V RIGHT COMPARISON:  08/01/2018 FINDINGS: No evidence of fracture. No evidence of osteoarthritis of either hip. Calcifications adjacent to the right greater trochanter could be seen with trochanteric bursitis. IMPRESSION: No acute finding. Calcifications adjacent to the right greater trochanter could be seen with trochanteric bursitis. Electronically Signed   By: Paulina Fusi M.D.   On: 08/19/2018 08:06    Discharge Instructions: Discharge Instructions    Diet - low sodium heart healthy   Complete by:  As directed    Diet - low sodium heart healthy   Complete by:  As directed  Discharge instructions   Complete by:  As directed    [name],  You were hospitalized due to your increased heart rate. Your heart rate has been well controlled during your hospital stay. We also stopped your hydrochlorothiazide (HCTZ) and had you on two other medications for your blood pressure and you were well controlled on these. I want you to continue taking these medications: amlodipine 2.5 mg once a day and lisinopril 10 mg once a day. Stop taking hydrochlorothiazide (HCTZ) 12.5 mg daily.   I know in the past amlodipine caused you to have swelling in your legs. For this reason, we started you on a low dose to prevent this from happening.  I have also sent in a prescription for your Diltiazem as needed for your atrial fibrillation, when your heart rate is >100.  Please continue taking all your other medications as prescribed. I have also put in orders for home health physical therapy.  Someone will be in touch with you to set this up. Also, please follow up with your PCP in the next 1-2 weeks.  Thank you for allowing us to be a part of your care!   Increase activity slowly   Complete by:  As directed    Increase activity slowly   Complete by:  As directed       Signed: Jaci Standardehman, Areeg N, DO 08/21/2018, 9:37 AM   Pager: [phone]

## 2018-08-22 NOTE — Progress Notes (Signed)
She was to have gone home on the 5th but PT didn't see her. Change 5th to OBS sub level 2. I can't comment on the 6th as an inpt charge bc I thought she was obs.

## 2018-08-30 NOTE — Telephone Encounter (Signed)
Pt doing much better.  Reports that HCTZ was stopped last week while she was in the hospital. Pt will call office back if BP issues begin after stopping the medication.

## 2018-09-14 ENCOUNTER — Other Ambulatory Visit (HOSPITAL_COMMUNITY): Payer: Self-pay | Admitting: Internal Medicine

## 2018-09-15 ENCOUNTER — Other Ambulatory Visit (HOSPITAL_COMMUNITY): Payer: Self-pay | Admitting: Cardiology

## 2018-09-18 ENCOUNTER — Telehealth: Payer: Self-pay | Admitting: Cardiology

## 2018-09-18 MED ORDER — AMLODIPINE BESYLATE 2.5 MG PO TABS: 2.5000 mg | ORAL_TABLET | Freq: Every day | ORAL | 4 refills | Status: DC

## 2018-09-18 MED ORDER — LISINOPRIL 10 MG PO TABS: 10.0000 mg | ORAL_TABLET | Freq: Every day | ORAL | 4 refills | Status: DC

## 2018-09-18 NOTE — Telephone Encounter (Signed)
Patient was given these scripts from hospital  1. Which medications need to be refilled? (please list name of each medication and dose if known) Amlodipine 2.5mg  once daily; Lisinopril 10mg  once daily  2. Which pharmacy/location (including street and city if local pharmacy) is medication to be sent to? [facility] on [address]  3. Do they need a 30 day or 90 day supply? 30

## 2018-09-18 NOTE — Telephone Encounter (Signed)
Informed pt that I would send in refills per her request. Rx sent to [facility]  Pt inquired as to making her f/u appt w/ [facility].  Informed pt that office would be in contact with her once [place] scheduled is open for April.  Pt aware office will contact her to arrange this.Marland Kitchen

## 2018-10-16 NOTE — Telephone Encounter (Signed)
error 

## 2018-10-17 ENCOUNTER — Other Ambulatory Visit (HOSPITAL_COMMUNITY): Payer: Self-pay | Admitting: Cardiology

## 2018-10-17 NOTE — Telephone Encounter (Signed)
Scr 1.21 age 85, TBW 42.9 Last visit 12/14/2017 Continue eliquis 2.5mg  BID

## 2018-12-04 ENCOUNTER — Telehealth: Payer: Self-pay | Admitting: *Deleted

## 2018-12-04 NOTE — Telephone Encounter (Signed)
Called patient to let them know due to recent COVID19 CDC and Health Department Protocols, we are not seeing patients in the office. We are instead seeing if they would like to schedule this appointment as a Virtual Appointment VIA Smartphone or Laptop. Unable to reach patient. LVMTCB  

## 2018-12-05 NOTE — Telephone Encounter (Signed)
Virtual Visit Pre-Appointment Phone Call  Steps For Call:  1. Confirm consent - "In the setting of the current Covid19 crisis, you are scheduled for a (phone or video) visit with your provider on (date) at (time).  Just as we do with many in-office visits, in order for you to participate in this visit, we must obtain consent.  If you'd like, I can send this to your mychart (if signed up) or email for you to review.  Otherwise, I can obtain your verbal consent now.  All virtual visits are billed to your insurance company just like a normal visit would be.  By agreeing to a virtual visit, we'd like you to understand that the technology does not allow for your provider to perform an examination, and thus may limit your provider's ability to fully assess your condition. If your provider identifies any concerns that need to be evaluated in person, we will make arrangements to do so.  Finally, though the technology is pretty good, we cannot assure that it will always work on either your or our end, and in the setting of a video visit, we may have to convert it to a phone-only visit.  In either situation, we cannot ensure that we have a secure connection.  Are you willing to proceed?" STAFF: Did the patient verbally acknowledge consent to telehealth visit? Document YES/NO here: YES  2. Confirm the BEST phone number to call the day of the visit by including in appointment notes  3. Give patient instructions for MyChart download to smartphone OR Doximity/Doxy.me as below if video visit (depending on what platform provider is using)  4. Confirm that appointment type is correct in Epic appointment notes (VIDEO vs PHONE)  5. Advise patient to be prepared with their blood pressure, heart rate, weight, any heart rhythm information, their current medicines, and a piece of paper and pen handy for any instructions they may receive the day of their visit  6. Inform patient they will receive a phone call 15 minutes  prior to their appointment time (may be from unknown caller ID) so they should be prepared to answer    TELEPHONE CALL NOTE  Jodi Snyder has been deemed a candidate for a follow-up tele-health visit to limit community exposure during the Covid-19 pandemic. I spoke with the patient via phone to ensure availability of phone/video source, confirm preferred email & phone number, and discuss instructions and expectations.  I reminded Jodi Snyder to be prepared with any vital sign and/or heart rhythm information that could potentially be obtained via home monitoring, at the time of her visit. I reminded Jodi Snyder to expect a phone call prior to her visit.  Jodi Snyder 12/05/2018 8:56 AM   INSTRUCTIONS FOR DOWNLOADING THE MYCHART APP TO SMARTPHONE  - The patient must first make sure to have activated MyChart and know their login information - If Apple, go to [employer] and type in MyChart in the search bar and download the app. If Android, ask patient to go to [employer] and type in [place] in the search bar and download the app. The app is free but as with any other app downloads, their phone may require them to verify saved payment information or Apple/Android password.  - The patient will need to then log into the app with their MyChart username and password, and select [facility] as their healthcare provider to link the account. When it is time for your visit, go to the MyChart  app, find appointments, and click Begin Video Visit. Be sure to Select Allow for your device to access the Microphone and Camera for your visit. You will then be connected, and your provider will be with you shortly.  **If they have any issues connecting, or need assistance please contact MyChart service desk [phone])**  **If using a computer, in order to ensure the best quality for their visit they will need to use either of the following Internet Browsers: [employer], or Google  Chrome**  IF USING DOXIMITY or DOXY.ME - The patient will receive a link just prior to their visit by text.     FULL LENGTH CONSENT FOR TELE-HEALTH VISIT   I hereby voluntarily request, consent and authorize [facility] and its employed or contracted physicians, physician assistants, nurse practitioners or other licensed health care professionals (the Practitioner), to provide me with telemedicine health care services (the "Services") as deemed necessary by the treating Practitioner. I acknowledge and consent to receive the Services by the Practitioner via telemedicine. I understand that the telemedicine visit will involve communicating with the Practitioner through live audiovisual communication technology and the disclosure of certain medical information by electronic transmission. I acknowledge that I have been given the opportunity to request an in-person assessment or other available alternative prior to the telemedicine visit and am voluntarily participating in the telemedicine visit.  I understand that I have the right to withhold or withdraw my consent to the use of telemedicine in the course of my care at any time, without affecting my right to future care or treatment, and that the Practitioner or I may terminate the telemedicine visit at any time. I understand that I have the right to inspect all information obtained and/or recorded in the course of the telemedicine visit and may receive copies of available information for a reasonable fee.  I understand that some of the potential risks of receiving the Services via telemedicine include:  Marland Kitchen Delay or interruption in medical evaluation due to technological equipment failure or disruption; . Information transmitted may not be sufficient (e.g. poor resolution of images) to allow for appropriate medical decision making by the Practitioner; and/or  . In rare instances, security protocols could fail, causing a breach of personal health  information.  Furthermore, I acknowledge that it is my responsibility to provide information about my medical history, conditions and care that is complete and accurate to the best of my ability. I acknowledge that Practitioner's advice, recommendations, and/or decision may be based on factors not within their control, such as incomplete or inaccurate data provided by me or distortions of diagnostic images or specimens that may result from electronic transmissions. I understand that the practice of medicine is not an exact science and that Practitioner makes no warranties or guarantees regarding treatment outcomes. I acknowledge that I will receive a copy of this consent concurrently upon execution via email to the email address I last provided but may also request a printed copy by calling the office of [facility].    I understand that my insurance will be billed for this visit.   I have read or had this consent read to me. . I understand the contents of this consent, which adequately explains the benefits and risks of the Services being provided via telemedicine.  . I have been provided ample opportunity to ask questions regarding this consent and the Services and have had my questions answered to my satisfaction. . I give my informed consent for the services  to be provided through the use of telemedicine in my medical care  By participating in this telemedicine visit I agree to the above. Verbal Consent Given

## 2018-12-11 ENCOUNTER — Telehealth (INDEPENDENT_AMBULATORY_CARE_PROVIDER_SITE_OTHER): Payer: Medicare Other | Admitting: Cardiology

## 2018-12-11 ENCOUNTER — Other Ambulatory Visit: Payer: Self-pay

## 2018-12-11 DIAGNOSIS — I480 Paroxysmal atrial fibrillation: Secondary | ICD-10-CM

## 2018-12-11 NOTE — Progress Notes (Signed)
Electrophysiology TeleHealth Note   Due to national recommendations of social distancing due to COVID 19, an audio/video telehealth visit is felt to be most appropriate for this patient at this time.  See Epic message for the patient's consent to telehealth for [facility].   Date:  12/11/2018   ID:  Jodi Snyder, DOB May 13, 1932, MRN [id number]  Location: patient's home  Provider location: [address] [place]  Evaluation Performed: Follow-up visit  PCP:  Elspeth Cho., MD  Cardiologist:  Electrophysiologist:  Dr Elberta Fortis  Chief Complaint:  AF  History of Present Illness:    Jodi Snyder is a 85 y.o. female who presents via audio/video conferencing for a telehealth visit today.  Since last being seen in our clinic, the patient reports doing very well.  Today, she denies symptoms of palpitations, chest pain, shortness of breath,  lower extremity edema, dizziness, presyncope, or syncope.  The patient is otherwise without complaint today.  The patient denies symptoms of fevers, chills, cough, or new SOB worrisome for COVID 19.  She has a history of paroxysmal atrial fibrillation, hypertension, and polymyalgia rheumatica.  Today, denies symptoms of palpitations, chest pain, shortness of breath, orthopnea, PND, lower extremity edema, claudication, dizziness, presyncope, syncope, bleeding, or neurologic sequela. The patient is tolerating medications without difficulties.  Overall she is feeling well.  She has no chest pain or shortness of breath.  Her blood pressure has been well controlled at home.  She has had a rough few months.  She had a bad attack of her polymyalgia rheumatica and has been on prednisone.  She then was admitted to the hospital after she was found to be tachycardic into the 1 teens.  It appears that this was due to atrial flutter.  She converted to sinus rhythm.  She was found to be quite dehydrated and hypokalemic.  She was given IV fluids and  potassium.  Past Medical History:  Diagnosis Date  . Atrial fibrillation (HCC)   . Hypertension   . Immune system disorder (HCC)   . Polymyalgia (HCC)     Past Surgical History:  Procedure Laterality Date  . ABDOMINAL HYSTERECTOMY    . BLADDER SURGERY    . OOPHORECTOMY      Current Outpatient Medications  Medication Sig Dispense Refill  . ALPRAZolam (XANAX) 0.5 MG tablet Take 0.25 mg by mouth 2 (two) times daily. Supper time and bedtime    . amLODipine (NORVASC) 2.5 MG tablet Take 1 tablet (2.5 mg total) by mouth daily. 30 tablet 4  . bimatoprost (LUMIGAN) 0.03 % ophthalmic solution Place 1 drop into both eyes at bedtime.     . Biotin (BIOTIN 5000) 5 MG CAPS Take 5 mg by mouth daily.    . brinzolamide (AZOPT) 1 % ophthalmic suspension Place 1 drop into both eyes 3 (three) times daily.     . cycloSPORINE (RESTASIS) 0.05 % ophthalmic emulsion Place 1 drop into both eyes 2 (two) times daily.     Marland Kitchen diltiazem (CARDIZEM) 30 MG tablet Take 1 tablet every 4 hours AS NEEDED for afib heart rate >100 45 tablet 1  . ELIQUIS 2.5 MG TABS tablet TAKE 1 TABLET BY MOUTH TWICE DAILY 60 tablet 5  . esomeprazole (NEXIUM) 40 MG capsule Take 40 mg by mouth daily.     . hydroxychloroquine (PLAQUENIL) 200 MG tablet Take 200 mg by mouth daily.    . Hypromellose (ARTIFICIAL TEARS OP) Place 1 drop into both eyes daily as  needed (dry eyes).    Marland Kitchen. lisinopril (PRINIVIL,ZESTRIL) 10 MG tablet Take 1 tablet (10 mg total) by mouth daily. 30 tablet 4  . Multiple Vitamins-Minerals (ICAPS AREDS 2 PO) Take 2 capsules by mouth 2 (two) times daily.    . predniSONE (DELTASONE) 5 MG tablet Take 5 mg by mouth daily.     . traMADol-acetaminophen (ULTRACET) 37.5-325 MG tablet Take 1 tablet by mouth 2 (two) times daily.      No current facility-administered medications for this visit.     Allergies:   Penicillins; Brimonidine; Other; Penicillin g; Timolol maleate; Sulfa antibiotics; and Sulfasalazine   Social History:  The  patient  reports that she has quit smoking. She has never used smokeless tobacco. She reports that she does not drink alcohol or use drugs.   Family History:  The patient's  family history includes Anuerysm in her father; Heart attack in her mother; Stroke in her father.   ROS:  Please see the history of present illness.   All other systems are personally reviewed and negative.    Exam:    Vital Signs:  There were no vitals taken for this visit.  Over the phone, no acute distress, no shortness of breath.  Labs/Other Tests and Data Reviewed:    Recent Labs: 08/19/2018: ALT 22; Hemoglobin 13.9; Platelets 217 08/20/2018: Magnesium 2.1 08/21/2018: BUN 22; Creatinine, Ser 1.21; Potassium 4.5; Sodium 141   Wt Readings from Last 3 Encounters:  08/21/18 94 lb 8 oz (42.9 kg)  08/01/18 102 lb (46.3 kg)  06/12/18 105 lb (47.6 kg)     Other studies personally reviewed: Additional studies/ records that were reviewed today include: ECG 08/19/2018 personally reviewed Review of the above records today demonstrates:   Atrial flutter, rate 115   ASSESSMENT & PLAN:    1.  Paroxysmal atrial fibrillation: Currently on Eliquis and diltiazem.  Currently appears to be in sinus rhythm.  Has not had any further episodes since her hospitalization in January.  It is certainly possible this was due to both dehydration and hypokalemia.  No changes.  This patients CHA2DS2-VASc Score and unadjusted Ischemic Stroke Rate (% per year) is equal to 4.8 % stroke rate/year from a score of 4  Above score calculated as 1 point each if present [CHF, HTN, DM, Vascular=MI/PAD/Aortic Plaque, Age if 65-74, or Female] Above score calculated as 2 points each if present [Age > 75, or Stroke/TIA/TE]  2.  Hypertension: Currently well controlled.  No medication changes at this time   COVID 19 screen The patient denies symptoms of COVID 19 at this time.  The importance of social distancing was discussed today.  Follow-up: 6  months  Current medicines are reviewed at length with the patient today.   The patient does not have concerns regarding her medicines.  The following changes were made today:  none  Labs/ tests ordered today include:  No orders of the defined types were placed in this encounter.  This visit was done over the telephone.  The patient has a landline and does not have access to a computer.  Patient Risk:  after full review of this patients clinical status, I feel that they are at moderate risk at this time.  Today, I have spent 12 minutes with the patient with telehealth technology discussing AF, recent hospitalization .    Signed, Tawana Pasch Jorja LoaMartin Steen Bisig, MD  12/11/2018 1:45 PM     [facility] [address] [place] [id number] [phone] (office) (  [phone] (fax)

## 2019-02-27 ENCOUNTER — Telehealth: Payer: Self-pay | Admitting: *Deleted

## 2019-02-27 NOTE — Telephone Encounter (Signed)
Patient with diagnosis of afib on Eliquis for anticoagulation.    Procedure: MOLAR EXTRACTION-surgical extraction Date of procedure: TBD  CHADS2-VASc score of  4 (HTN, AGE, AGE, female)  CrCl 23 ml/min  Per office protocol, patient can hold Eliquis for 2 days prior to procedure.

## 2019-02-27 NOTE — Telephone Encounter (Signed)
   [facility] Pre-operative Risk Assessment    Request for surgical clearance:  1. What type of surgery is being performed? MOLAR EXTRACTION  2. When is this surgery scheduled? TBD  3. What type of clearance is required (medical clearance vs. Pharmacy clearance to hold med vs. Both)? BOTH  4. Are there any medications that need to be held prior to surgery and how long? ELQUIS  5. Practice name and name of physician performing surgery? [facility]; DR. Berniece Pap & [facility]  6. What is your office phone number [phone]   7.   What is your office fax number [phone]  8.   Anesthesia type (None, local, MAC, general) ? LOCAL & NITROUS OXIDE   Jodi Snyder 02/27/2019, 3:36 PM  _________________________________________________________________   (provider comments below)

## 2019-02-28 ENCOUNTER — Encounter: Payer: Self-pay | Admitting: Cardiology

## 2019-02-28 NOTE — Telephone Encounter (Signed)
No message needed °

## 2019-02-28 NOTE — Telephone Encounter (Signed)
I have tried to reach pt several times today phone rings but cuts off, I have asked one of office APPS to try from the office.

## 2019-03-01 NOTE — Telephone Encounter (Signed)
   Primary Cardiologist: Will Meredith Leeds, MD  Chart reviewed and patient contacted today as part of pre-operative protocol coverage. Given past medical history and time since last visit, based on ACC/AHA guidelines, Kiara Mcdowell would be at acceptable risk for the planned procedure without further cardiovascular testing.   OK to hold Eliquis 48 hours pre op if needed.  I will route this recommendation to the requesting party via Epic fax function and remove from pre-op pool.  Please call with questions.  Kerin Ransom, PA-C 03/01/2019, 1:29 PM

## 2019-03-01 NOTE — Telephone Encounter (Signed)
  Patient's phone was messed up yesterday and she has hopefully fixed the issue.  Please call her back.

## 2019-03-14 ENCOUNTER — Other Ambulatory Visit (HOSPITAL_COMMUNITY): Payer: Self-pay | Admitting: Cardiology

## 2019-03-14 NOTE — Telephone Encounter (Signed)
Eliquis 2.5mg  refill request received; pt is 85 yrs old, wt-47.6kg, Crea-1.21 on 08/21/2018, last seen by Dr. Curt Bears on 12/11/2018, Diagnosis Afib; will send in refill.

## 2019-03-16 ENCOUNTER — Other Ambulatory Visit: Payer: Self-pay | Admitting: Cardiology

## 2019-03-16 MED ORDER — AMLODIPINE BESYLATE 2.5 MG PO TABS: 2.5000 mg | ORAL_TABLET | Freq: Every day | ORAL | 2 refills | Status: DC

## 2019-03-16 MED ORDER — LISINOPRIL 10 MG PO TABS: 10.0000 mg | ORAL_TABLET | Freq: Every day | ORAL | 2 refills | Status: DC

## 2019-03-16 NOTE — Telephone Encounter (Signed)
Pt's medications were sent to pt's pharmacy as requested. Confirmation received.  

## 2019-03-20 ENCOUNTER — Telehealth: Payer: Self-pay | Admitting: Cardiology

## 2019-03-20 MED ORDER — LISINOPRIL 10 MG PO TABS: 10.0000 mg | ORAL_TABLET | Freq: Every day | ORAL | 1 refills | Status: DC

## 2019-03-20 MED ORDER — AMLODIPINE BESYLATE 2.5 MG PO TABS: 2.5000 mg | ORAL_TABLET | Freq: Every day | ORAL | 1 refills | Status: DC

## 2019-03-20 NOTE — Telephone Encounter (Signed)
This is a [facility] pt 

## 2019-03-20 NOTE — Telephone Encounter (Signed)
Refills sent

## 2019-03-20 NOTE — Telephone Encounter (Signed)
°*  STAT* If patient is at the pharmacy, call can be transferred to refill team.   1. Which medications need to be refilled? (please list name of each medication and dose if known)  lisinopril (ZESTRIL) 10 MG tablet  amLODipine (NORVASC) 2.5 MG    2. Which pharmacy/location (including street and city if local pharmacy) is medication to be sent to? [id number] - [place], [place] - [address] AT [place] [address] [phone] (Phone) [phone] (Fax)    3. Do they need a 30 day or 90 day supply?  [id number] - [place], [place] - [address] AT [address] [phone] (Phone) [phone] (Fax)

## 2019-06-14 ENCOUNTER — Telehealth: Payer: Self-pay

## 2019-06-14 NOTE — Telephone Encounter (Signed)
Patient advised. Verbalized understanding and agreeable to plan.

## 2019-06-14 NOTE — Telephone Encounter (Signed)
OK to continue if missed dose. No other changes.

## 2019-06-14 NOTE — Telephone Encounter (Signed)
Patient is concerned about her eliquis. She may have taken an extra dose yesterday. She takes 5 mg BID. When she went to take her evening dose yesterday, she found an eliquis pill on the counter and wasn't sure if that was her am dose and that she had missed taking it.  She took a second pm dose at midnight last night then became very concerned that she was having a reaction and a potential overdose after feeling dizzy and anxious. She called poison control center who told her to call her Dr.'s office in the morning.  She has not taken her am dose today and is waiting for instructions on what to do.

## 2019-06-25 ENCOUNTER — Ambulatory Visit: Payer: Medicare Other | Admitting: Cardiology

## 2019-06-25 ENCOUNTER — Encounter: Payer: Self-pay | Admitting: Cardiology

## 2019-06-25 ENCOUNTER — Other Ambulatory Visit: Payer: Self-pay

## 2019-06-25 VITALS — BP 168/88 | HR 68 | Ht 63.0 in | Wt 100.0 lb

## 2019-06-25 DIAGNOSIS — I480 Paroxysmal atrial fibrillation: Secondary | ICD-10-CM | POA: Diagnosis not present

## 2019-06-25 NOTE — Progress Notes (Signed)
Electrophysiology Office Note   Date:  06/25/2019   ID:  Jodi Snyder Snyder, DOB 31-Jan-1932, MRN [id number]  PCP:  Elspeth Choerrell, Grace E., MD  Cardiologist:  none Primary Electrophysiologist:  Keymani Glynn Jorja LoaMartin Eliyah Bazzi, MD    No chief complaint on file.    History of Present Illness: Jodi Snyder is a 85 y.o. female who is being seen today for the evaluation of atrial fibrillation at the request of Elspeth Choerrell, Grace E., MD. Presenting today for electrophysiology evaluation. History of HTN and polymyalgia. She presented to the ER on 6/2 with palpitations, lightheadedness, SOB. Found to be in AF with RVR with rates of 160 bpm. Successfully cardioverted. Recently had prednisone increased for polymyalgia. Was placed on Eliquis. She has diltiazem as needed for AF but is not on chronic medications.  Today, denies symptoms of palpitations, chest pain, shortness of breath, orthopnea, PND, lower extremity edema, claudication, dizziness, presyncope, syncope, bleeding, or neurologic sequela. The patient is tolerating medications without difficulties.  She is noted no further episodes of atrial fibrillation.  Unfortunately, she has been having issues with her eyes.  Glaucoma as well as macular degeneration.  She is unsure if it is wet or dry macular degeneration and she is currently undergoing work-up.   Past Medical History:  Diagnosis Date  . Atrial fibrillation (HCC)   . Hypertension   . Immune system disorder (HCC)   . Polymyalgia (HCC)    Past Surgical History:  Procedure Laterality Date  . ABDOMINAL HYSTERECTOMY    . BLADDER SURGERY    . OOPHORECTOMY       Current Outpatient Medications  Medication Sig Dispense Refill  . ALPRAZolam (XANAX) 0.5 MG tablet Take 0.25 mg by mouth 2 (two) times daily. Supper time and bedtime    . amLODipine (NORVASC) 2.5 MG tablet Take 1 tablet (2.5 mg total) by mouth daily. 90 tablet 1  . bimatoprost (LUMIGAN) 0.03 % ophthalmic solution Place 1 drop into both eyes at  bedtime.     . Biotin (BIOTIN 5000) 5 MG CAPS Take 5 mg by mouth daily.    . brinzolamide (AZOPT) 1 % ophthalmic suspension Place 1 drop into both eyes 3 (three) times daily.     . cycloSPORINE (RESTASIS) 0.05 % ophthalmic emulsion Place 1 drop into both eyes 2 (two) times daily.     Marland Kitchen. diltiazem (CARDIZEM) 30 MG tablet Take 1 tablet every 4 hours AS NEEDED for afib heart rate >100 45 tablet 1  . ELIQUIS 2.5 MG TABS tablet TAKE 1 TABLET BY MOUTH TWICE DAILY 60 tablet 5  . esomeprazole (NEXIUM) 40 MG capsule Take 40 mg by mouth daily.     . hydroxychloroquine (PLAQUENIL) 200 MG tablet Take 200 mg by mouth daily.    . Hypromellose (ARTIFICIAL TEARS OP) Place 1 drop into both eyes daily as needed (dry eyes).    Marland Kitchen. ketorolac (ACULAR) 0.5 % ophthalmic solution Place 1 drop into both eyes 4 (four) times daily.    Marland Kitchen. lisinopril (ZESTRIL) 10 MG tablet Take 1 tablet (10 mg total) by mouth daily. 90 tablet 1  . Multiple Vitamins-Minerals (ICAPS AREDS 2 PO) Take 2 capsules by mouth 2 (two) times daily.    . predniSONE (DELTASONE) 5 MG tablet Take 5 mg by mouth daily.     . traMADol-acetaminophen (ULTRACET) 37.5-325 MG tablet Take 1 tablet by mouth 2 (two) times daily.      No current facility-administered medications for this visit.     Allergies:   Penicillins, Brimonidine,  Other, Penicillin g, Timolol maleate, Sulfa antibiotics, and Sulfasalazine   Social History:  The patient  reports that she has quit smoking. She has never used smokeless tobacco. She reports that she does not drink alcohol or use drugs.   Family History:  The patient's family history includes Anuerysm in her father; Heart attack in her mother; Stroke in her father.   ROS:  Please see the history of present illness.   Otherwise, review of systems is positive for none.   All other systems are reviewed and negative.   PHYSICAL EXAM: VS:  BP (!) 168/88   Pulse 68   Ht 5\' 3"  (1.6 m)   Wt 100 lb (45.4 kg)   SpO2 90%   BMI 17.71  kg/m  , BMI Body mass index is 17.71 kg/m. GEN: Well nourished, well developed, in no acute distress  HEENT: normal  Neck: no JVD, carotid bruits, or masses Cardiac: RRR; no murmurs, rubs, or gallops,no edema  Respiratory:  clear to auscultation bilaterally, normal work of breathing GI: soft, nontender, nondistended, + BS MS: no deformity or atrophy  Skin: warm and dry Neuro:  Strength and sensation are intact Psych: euthymic mood, full affect  EKG:  EKG is ordered today. Personal review of the ekg ordered shows sinus rhythm rate 68  Recent Labs: 08/19/2018: ALT 22; Hemoglobin 13.9; Platelets 217 08/20/2018: Magnesium 2.1 08/21/2018: BUN 22; Creatinine, Ser 1.21; Potassium 4.5; Sodium 141    Lipid Panel  No results found for: CHOL, TRIG, HDL, CHOLHDL, VLDL, LDLCALC, LDLDIRECT   Wt Readings from Last 3 Encounters:  06/25/19 100 lb (45.4 kg)  08/21/18 94 lb 8 oz (42.9 kg)  08/01/18 102 lb (46.3 kg)      Other studies Reviewed: Additional studies/ records that were reviewed today include: TTE 02/09/17  Review of the above records today demonstrates:  - Left ventricle: The cavity size was normal. Wall thickness was   normal. Systolic function was normal. The estimated ejection   fraction was in the range of 60% to 65%. Wall motion was normal;   there were no regional wall motion abnormalities. Features are   consistent with a pseudonormal left ventricular filling pattern,   with concomitant abnormal relaxation and increased filling   pressure (grade 2 diastolic dysfunction). - Aortic valve: Mildly calcified annulus. - Mitral valve: There was mild regurgitation. - Pulmonary arteries: Systolic pressure was mildly increased. PA   peak pressure: 32 mm Hg (S).   ASSESSMENT AND PLAN:  1.  Paroxysmal atrial fibrillation: On Eliquis and as needed diltiazem.  Fortunately she remains in sinus rhythm.  No changes.  This patients CHA2DS2-VASc Score and unadjusted Ischemic Stroke Rate  (% per year) is equal to 4.8 % stroke rate/year from a score of 4  Above score calculated as 1 point each if present [CHF, HTN, DM, Vascular=MI/PAD/Aortic Plaque, Age if 65-74, or Female] Above score calculated as 2 points each if present [Age > 75, or Stroke/TIA/TE]   2. Hypertension: Blood pressure is elevated today.  He brings in blood pressure recordings that show average blood pressures less than 130.  No changes.   Current medicines are reviewed at length with the patient today.   The patient does not have concerns regarding her medicines.  The following changes were made today: None  Labs/ tests ordered today include:  Orders Placed This Encounter  Procedures  . EKG 12-Lead     Disposition:   FU with Cincere Zorn 6 months  Signed, Willmer Fellers  Jorja Loa, MD  06/25/2019 4:12 PM     [facility] [address] [place] [address] [phone] (office) [phone] (fax)

## 2019-07-19 IMAGING — CT CT CERVICAL SPINE W/O CM
3 series · 13 of 33 positions shown, 16 images · non-contrast
Comparison: Head CT 08/01/2018

CLINICAL DATA: Fall after syncopal episode hitting head. Patient on
blood thinners.

EXAM:
CT HEAD WITHOUT CONTRAST
CT CERVICAL SPINE WITHOUT CONTRAST
TECHNIQUE: Multidetector CT imaging of the head and cervical spine was
performed following the standard protocol without intravenous
contrast. Multiplanar CT image reconstructions of the cervical spine
were also generated.

[Series 4: c_spine 2.0 st · axial · 0.27mm/px · z∈[-223,-97]mm · 5 of 85 slices shown, 7 images]
[im 13/85  soft-tissue]
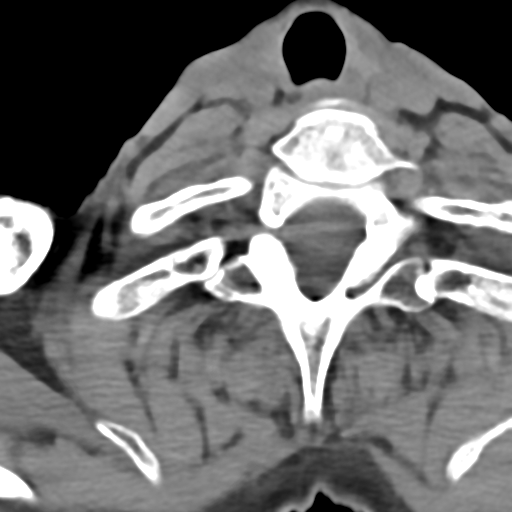
[im 13/85  bone]
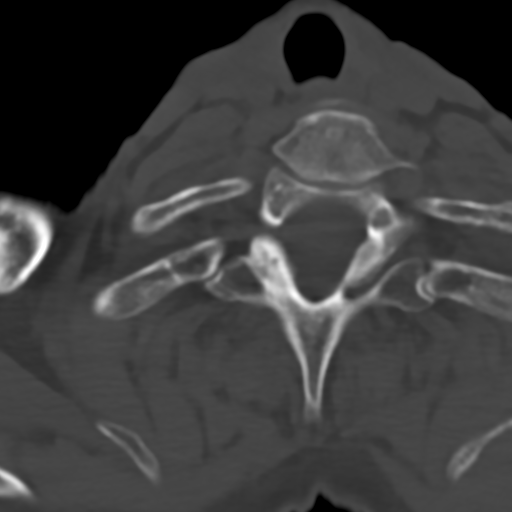
[im 26/85  bone]
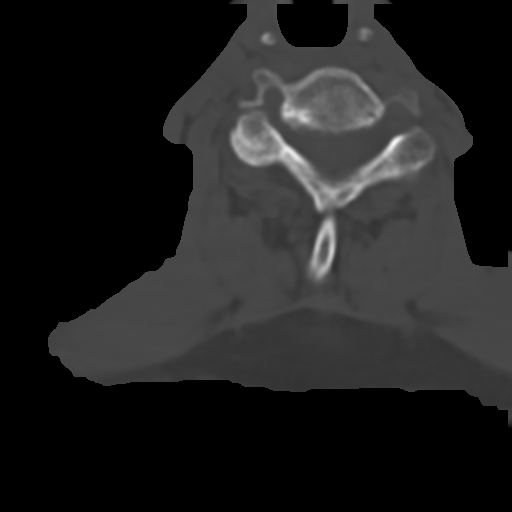
[im 46/85  bone]
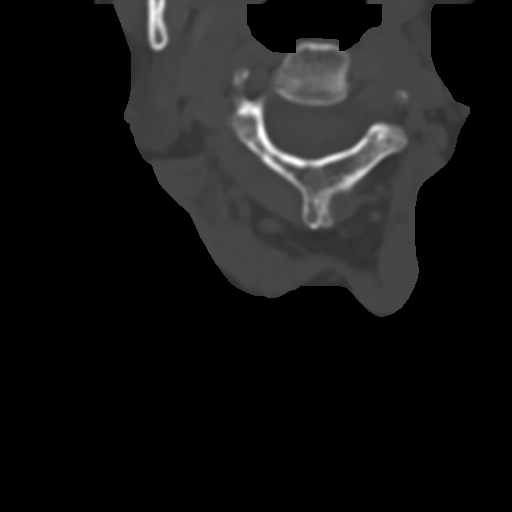
[im 59/85  bone]
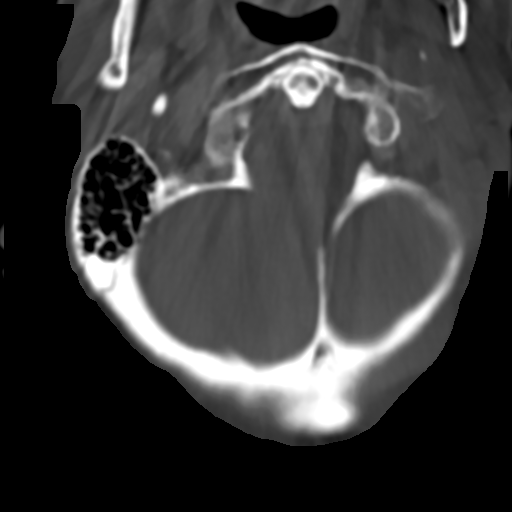
[im 72/85  soft-tissue]
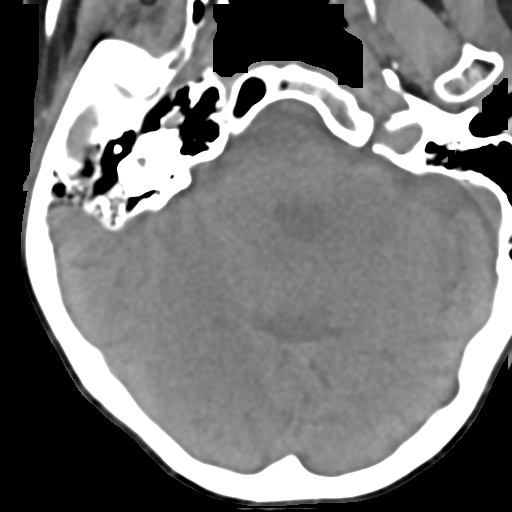
[im 72/85  bone]
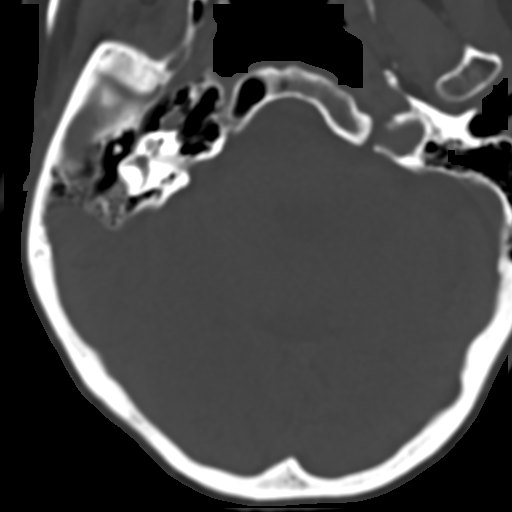

[Series 6: c_spine 2.0 sag bone · sagittal · 0.29mm/px · 5 of 61 slices shown, 6 images]
[im 21/61  bone]
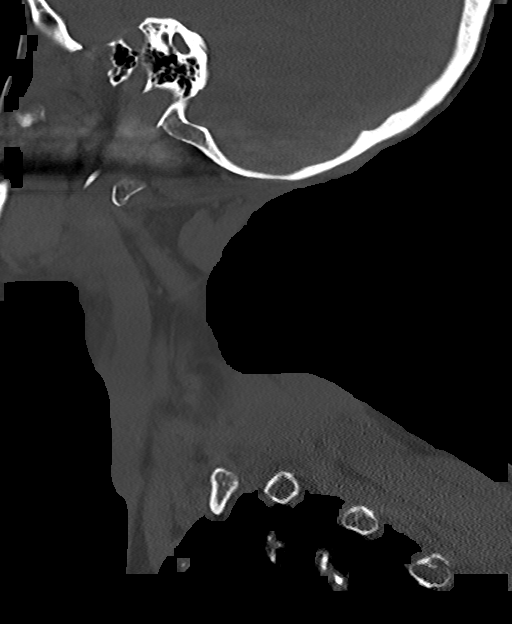
[im 26/61  bone]
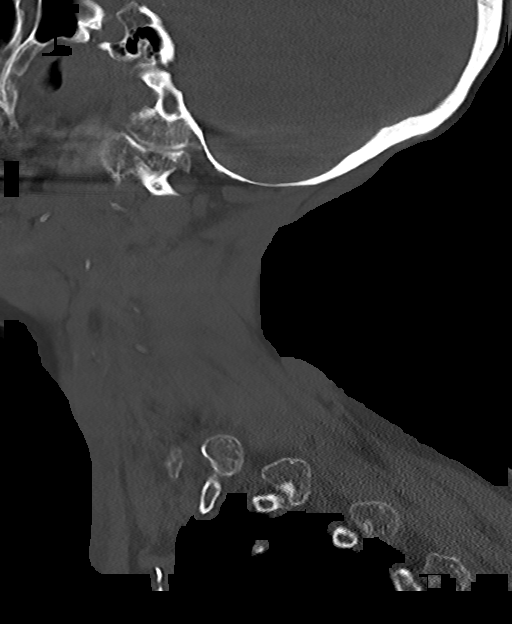
[im 31/61  soft-tissue]
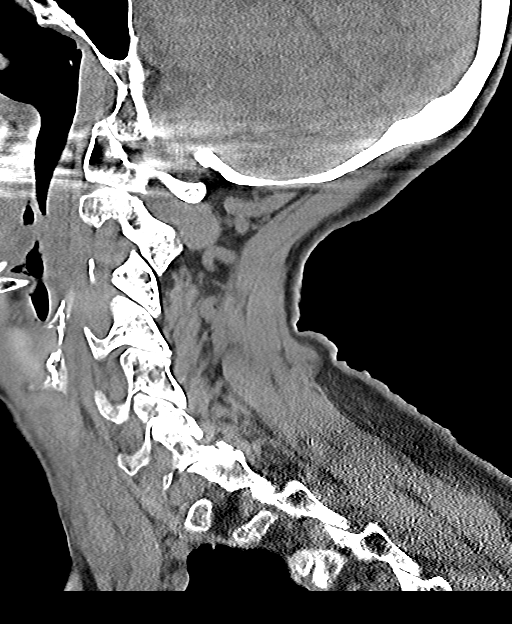
[im 31/61  bone]
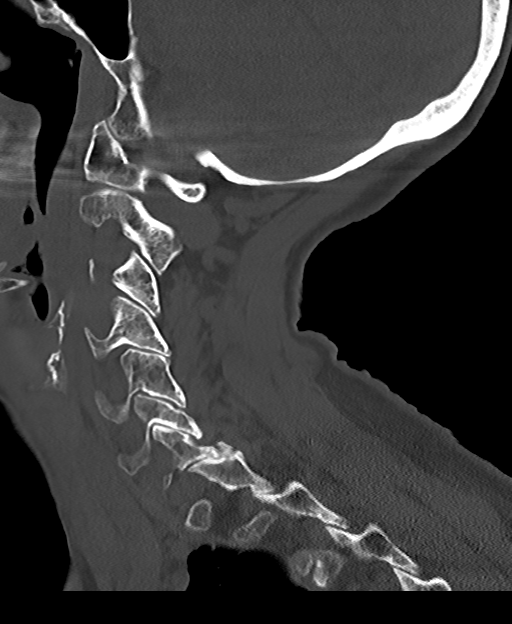
[im 36/61  bone]
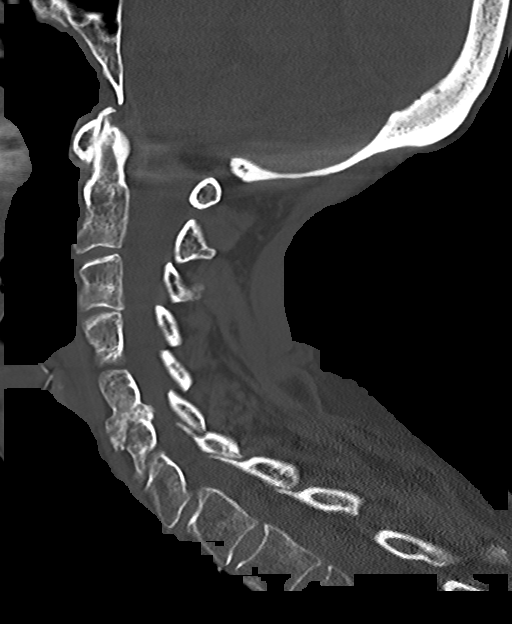
[im 41/61  bone]
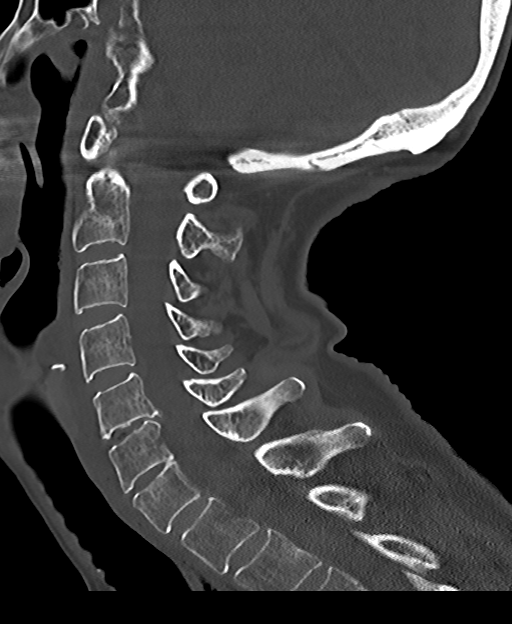

[Series 7: c_spine 2.0 cor bone · coronal · 0.26mm/px · 3 of 61 slices shown]
[im 13/61  bone]
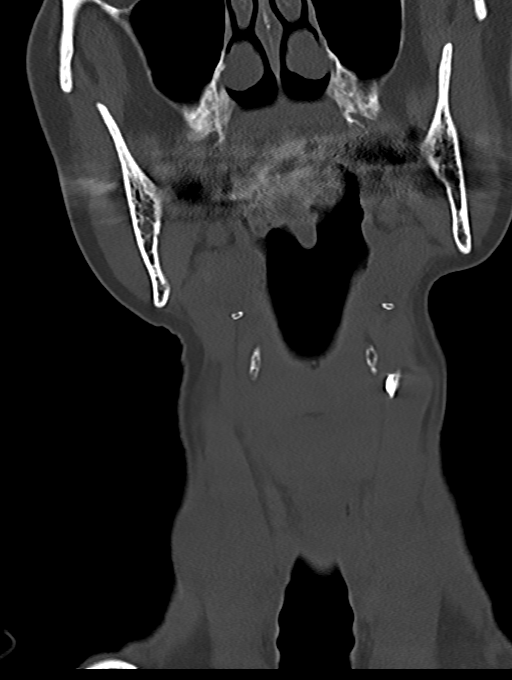
[im 25/61  bone]
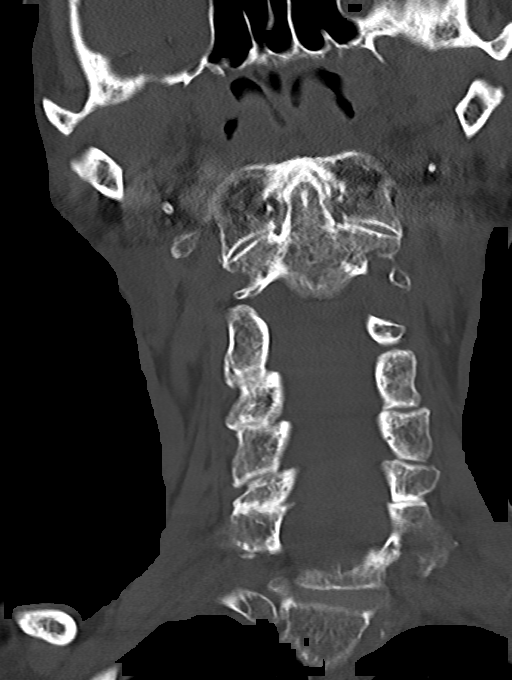
[im 37/61  bone]
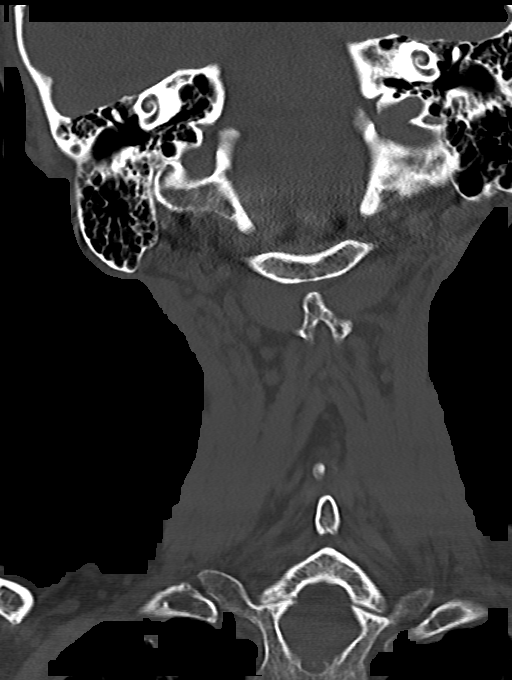

[13 of 33 positions shown; findings below may reference images not displayed]

FINDINGS: CT HEAD FINDINGS

Brain: Ventricles, cisterns and other CSF spaces are within normal.
There is mild chronic ischemic microvascular disease. There is no
mass, mass effect, shift of midline structures or acute hemorrhage.
No evidence of acute infarction.

Vascular: No hyperdense vessel or unexpected calcification.

Skull: Normal. Negative for fracture or focal lesion.

Sinuses/Orbits: Orbits are normal.  Paranasal sinuses are clear.

Other: None.

CT CERVICAL SPINE FINDINGS

Alignment: Within normal.

Skull base and vertebrae: Mild spondylosis throughout the cervical
spine. Vertebral body heights are maintained. Atlantoaxial
articulation is within normal. There is mild uncovertebral joint
spurring and minimal facet arthropathy. Mild right-sided neural
foraminal narrowing at the C5-6 level. No acute fracture or
subluxation.

Soft tissues and spinal canal: No prevertebral fluid or swelling. No
visible canal hematoma.

Disc levels:  Mild disc space narrowing at the C5-6 and C6-7 levels.

Upper chest: Partially visualized biapical nodular scarring with
calcification.

Other: None.
IMPRESSION: No acute brain injury.

Chronic ischemic microvascular disease.

No acute cervical spine injury.

Mild spondylosis of the cervical spine with mild disc disease at the
C5-6 and C6-7 levels. Minimal right-sided neural foraminal narrowing
at the C5-6 level.

Biapical nodularity with calcification likely chronic scarring and
granulomatous change. Recommend noncontrast chest CT on elective
basis.

## 2019-08-15 ENCOUNTER — Emergency Department (HOSPITAL_BASED_OUTPATIENT_CLINIC_OR_DEPARTMENT_OTHER)
Admission: EM | Admit: 2019-08-15 | Discharge: 2019-08-15 | Disposition: A | Discharge status: Home / Self Care | Payer: Medicare Other | Attending: Emergency Medicine | Admitting: Emergency Medicine

## 2019-08-15 ENCOUNTER — Other Ambulatory Visit: Payer: Self-pay

## 2019-08-15 ENCOUNTER — Encounter (HOSPITAL_BASED_OUTPATIENT_CLINIC_OR_DEPARTMENT_OTHER): Payer: Self-pay | Admitting: *Deleted

## 2019-08-15 DIAGNOSIS — Z79899 Other long term (current) drug therapy: Secondary | ICD-10-CM | POA: Insufficient documentation

## 2019-08-15 DIAGNOSIS — I4891 Unspecified atrial fibrillation: Secondary | ICD-10-CM | POA: Diagnosis not present

## 2019-08-15 DIAGNOSIS — I129 Hypertensive chronic kidney disease with stage 1 through stage 4 chronic kidney disease, or unspecified chronic kidney disease: Secondary | ICD-10-CM | POA: Diagnosis not present

## 2019-08-15 DIAGNOSIS — N183 Chronic kidney disease, stage 3 unspecified: Secondary | ICD-10-CM | POA: Insufficient documentation

## 2019-08-15 DIAGNOSIS — Z87891 Personal history of nicotine dependence: Secondary | ICD-10-CM | POA: Insufficient documentation

## 2019-08-15 DIAGNOSIS — Z7901 Long term (current) use of anticoagulants: Secondary | ICD-10-CM | POA: Insufficient documentation

## 2019-08-15 DIAGNOSIS — I10 Essential (primary) hypertension: Secondary | ICD-10-CM

## 2019-08-15 NOTE — ED Triage Notes (Signed)
BP this morning was 190/90 at home.

## 2019-08-15 NOTE — ED Provider Notes (Signed)
[facility] EMERGENCY DEPARTMENT Provider Note   CSN: [id number] Arrival date & time: 08/15/19  8768     History Chief Complaint  Patient presents with  . Hypertension    Jodi Snyder is a 85 y.o. female with a history of paroxysmal A. fib, on Eliquis, polymyalgia, anxiety, hypertension (on norvasc and lisinopril), presented to the emergency department with concern for elevated blood pressure and possible A. fib.  Patient reports that she woke up this morning with a feeling of tightness up in her throat.  She is was concerned she checked her blood pressure.  She noted it was very elevated at home, 190/90.  She also continuously checked her heart rate and noted that initially it was 70, but after multiple rechecks it had reached 100.  She was concerned she might be in A. fib and therefore took one of her diltiazem tablets (30 mg, prescribed PRN for HR > 100).  She then came to the emergency department.  Since arriving to emergency department she feels like the tightness in her throat is improved.  However she explains to me at length all the stressors that are in her life.  She explains that she suffers from constant and chronic anxiety.  She does take Xanax 0.25 mg at night before bedtime to help with sleep.  HPI     Past Medical History:  Diagnosis Date  . Atrial fibrillation (HCC)   . Hypertension   . Immune system disorder (HCC)   . Polymyalgia [facility])     Patient Active Problem List   Diagnosis Date Noted  . Hypokalemia   . 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HCC) 08/19/2018  . Asymptomatic microscopic hematuria 01/05/2018  . Paroxysmal atrial fibrillation (HCC) 01/05/2018  . Scoliosis 10/18/2017  . Thoracogenic scoliosis of thoracolumbar region 10/08/2017  . Glaucoma 06/21/2016  . Drug therapy 01/28/2016  . Benign hypertension with CKD (chronic kidney disease) stage III 01/16/2016  . Discoid lupus erythematosus 01/16/2016  . Generalized anxiety disorder 01/16/2016  .  Osteoporosis 01/16/2016  . Polymyalgia rheumatica (HCC) 01/16/2016  . Closed fracture of distal end of left radius 11/11/2014  . Dry eye syndrome 08/03/2012  . Other states following surgery of eye and adnexa 04/21/2012  . 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 Low-tension glaucoma, unspecified eye, stage unspecified 04/07/2012  . Pseudophakia of both eyes 04/07/2012    Past Surgical History:  Procedure Laterality Date  . ABDOMINAL HYSTERECTOMY    . BLADDER SURGERY    . OOPHORECTOMY       OB History   No obstetric history on file.     Family History  Problem Relation Age of Onset  . Heart attack Mother   . Stroke Father   . Anuerysm Father     Social History   Tobacco Use  . Smoking status: Former Games developer  . Smokeless tobacco: Never Used  Substance Use Topics  . Alcohol use: No  . Drug use: No    Home Medications Prior to Admission medications   Medication Sig Start Date End Date Taking? Authorizing Provider  ALPRAZolam Prudy Feeler) 0.5 MG tablet Take 0.25 mg by mouth 2 (two) times daily. Supper time and bedtime 04/20/18  Yes [provider]  amLODipine (NORVASC) 2.5 MG tablet Take 1 tablet (2.5 mg total) by mouth daily. 03/20/19  Yes Camnitz, Andree Coss, MD  Biotin (BIOTIN 5000) 5 MG CAPS Take 5 mg by mouth daily.   Yes [provider]  brinzolamide (AZOPT) 1 % ophthalmic suspension Place 1  drop into both eyes 3 (three) times daily.    Yes [provider]  cycloSPORINE (RESTASIS) 0.05 % ophthalmic emulsion Place 1 drop into both eyes 2 (two) times daily.    Yes [provider]  diltiazem (CARDIZEM) 30 MG tablet Take 1 tablet every 4 hours AS NEEDED for afib heart rate >100 08/21/18  Yes Rehman, Areeg N, DO  ELIQUIS 2.5 MG TABS tablet TAKE 1 TABLET BY MOUTH TWICE DAILY 03/14/19  Yes Camnitz, Will Hassell Done, MD  esomeprazole (NEXIUM) 40 MG capsule Take 40 mg by mouth daily.    Yes [provider]  hydroxychloroquine (PLAQUENIL) 200  MG tablet Take 200 mg by mouth daily.   Yes [provider]  Hypromellose (ARTIFICIAL TEARS OP) Place 1 drop into both eyes daily as needed (dry eyes).   Yes [provider]  ketorolac (ACULAR) 0.5 % ophthalmic solution Place 1 drop into both eyes 4 (four) times daily. 06/21/19  Yes [provider]  lisinopril (ZESTRIL) 10 MG tablet Take 1 tablet (10 mg total) by mouth daily. 03/20/19  Yes Camnitz, Will Hassell Done, MD  Multiple Vitamins-Minerals (ICAPS AREDS 2 PO) Take 2 capsules by mouth 2 (two) times daily.   Yes [provider]  predniSONE (DELTASONE) 5 MG tablet Take 5 mg by mouth daily.  01/13/18  Yes [provider]  traMADol-acetaminophen (ULTRACET) 37.5-325 MG tablet Take 1 tablet by mouth 2 (two) times daily.    Yes [provider]    Allergies    Penicillins, Brimonidine, Other, Penicillin g, Timolol maleate, Sulfa antibiotics, and Sulfasalazine  Review of Systems   Review of Systems  Constitutional: Negative for chills and fever.  Eyes: Negative for photophobia and visual disturbance.  Respiratory: Negative for cough and shortness of breath.   Cardiovascular: Negative for chest pain and palpitations.  Gastrointestinal: Negative for abdominal pain, nausea and vomiting.  Musculoskeletal: Positive for arthralgias and back pain.  Neurological: Negative for syncope and light-headedness.  All other systems reviewed and are negative.   Physical Exam Updated Vital Signs BP (!) 162/103 (BP Location: Right Arm)   Pulse 72   Temp (!) 97.5 F (36.4 C) (Oral)   Resp 17   Ht 5\' 3"  (1.6 m)   Wt 45.7 kg   SpO2 100%   BMI 17.84 kg/m   Physical Exam Vitals and nursing note reviewed.  Constitutional:      General: She is not in acute distress.    Appearance: She is well-developed.  HENT:     Head: Normocephalic and atraumatic.  Eyes:     Conjunctiva/sclera: Conjunctivae normal.  Cardiovascular:     Rate and Rhythm: Normal rate and  regular rhythm.     Pulses: Normal pulses.     Heart sounds: No murmur.  Pulmonary:     Effort: Pulmonary effort is normal. No respiratory distress.     Breath sounds: Normal breath sounds.  Musculoskeletal:     Cervical back: Neck supple.  Skin:    General: Skin is warm and dry.  Neurological:     General: No focal deficit present.     Mental Status: She is alert and oriented to person, place, and time.     ED Results / Procedures / Treatments   Labs (all labs ordered are listed, but only abnormal results are displayed) Labs Reviewed - No data to display  EKG EKG Interpretation  Date/Time:  Wednesday August 15 2019 07:31:27 EST Ventricular Rate:  73 PR Interval:  QRS Duration: 87 QT Interval:  396 QTC Calculation: 437 R Axis:   81 Text Interpretation: Sinus rhythm Borderline right axis deviation No STEMI Confirmed by Alvester Chourifan, Teresea Donley [phone] on 08/15/2019 7:51:20 AM   Radiology No results found.  Procedures Procedures (including critical care time)  Medications Ordered in ED Medications - No data to display  ED Course  I have reviewed the triage vital signs and the nursing notes.  Pertinent labs & imaging results that were available during my care of the patient were reviewed by me and considered in my medical decision making (see chart for details).  This is a pleasant 85 year old female presents emergency department with concern for hypertension this morning, as well as borderline tachycardia with a reported home heart rate around 100 bpm.  She was concerned that she might be in A. fib.  On arrival her EKG shows normal sinus rhythm with a regular heart rate.  I explained it is less likely that she was in A. fib, but not impossible, given that her disease tends to be paroxysmal.  She may have spontaneously converted out before she arrived.  More likely, I believe she may be suffering from a strong anxiety component.  We had a lengthy discussion about how anxiety  can affect the patient's blood pressure.  We talked about breathing exercises and advised that if she has the same sensation, she should try one of the Xanax tablets, wait 15 to 30 minutes, and then call me retake her blood pressure and see if there is an improvement.  I also explained to her that we do not treat isolated hypertension.  She can keep a diary of her blood pressure at home, and then call and discuss her pressures with her primary care provider.  If she has chronic elevations, her provider may wish to make increases her blood pressure medicines.  However will not do this in the emergency department.  I have a very low suspicion for acute coronary syndrome.  Her EKG is very benign.  I explained this to her at the bedside.  She verbalizes understanding of this plan.  She is quite thankful and says that she feels much better after talking to me  This note was dictated using dragon dictation software.  Please be aware that there may be minor translation errors as a result of this oral dictation   Final Clinical Impression(s) / ED Diagnoses Final diagnoses:  Hypertension, unspecified type    Rx / DC Orders ED Discharge Orders    None       Azaylah Stailey, Kermit BaloMatthew J, MD 08/15/19 [phone]

## 2019-08-15 NOTE — Discharge Instructions (Addendum)
We talked about your high blood pressure today.  As I explained, your anxiety may be a strong driver in your high blood pressure.  We do not treat isolated hypertension in the emergency department.  I advised her to go home, try to relax, and keep a diary of your blood pressure 2 times a day.  If you feel like you are having severe anxiety, you can take one of your anxiety medications, and then wait 15-30 minutes and recheck your blood pressure in a quiet, calm setting.  Call your primary care doctor's office today or tomorrow to discuss your visit to the ER.  Your doctor may want to make adjustments to your blood pressure medicine.

## 2019-09-02 ENCOUNTER — Other Ambulatory Visit (HOSPITAL_COMMUNITY): Payer: Self-pay | Admitting: Cardiology

## 2019-09-03 NOTE — Telephone Encounter (Signed)
Eliquis 2.5mg  refill request received, pt is 86 yrs old, weight-45.7kg, Crea-1.20 on 03/29/2019 via [facility] at [place], [place], and last seen by Dr. Elberta Fortis on 07/05/2019. Dose is appropriate based on dosing criteria. Will send in refill to requested pharmacy.

## 2019-09-29 ENCOUNTER — Emergency Department (HOSPITAL_COMMUNITY): Payer: Medicare PPO

## 2019-09-29 ENCOUNTER — Other Ambulatory Visit: Payer: Self-pay

## 2019-09-29 ENCOUNTER — Observation Stay (HOSPITAL_COMMUNITY)
Admission: EM | Admit: 2019-09-29 | Discharge: 2019-09-30 | Disposition: A | Discharge status: Home / Self Care | Payer: Medicare PPO | Attending: Student in an Organized Health Care Education/Training Program | Admitting: Student in an Organized Health Care Education/Training Program

## 2019-09-29 ENCOUNTER — Encounter (HOSPITAL_COMMUNITY): Payer: Self-pay | Admitting: *Deleted

## 2019-09-29 DIAGNOSIS — Z7902 Long term (current) use of antithrombotics/antiplatelets: Secondary | ICD-10-CM | POA: Diagnosis not present

## 2019-09-29 DIAGNOSIS — N183 Chronic kidney disease, stage 3 unspecified: Secondary | ICD-10-CM | POA: Insufficient documentation

## 2019-09-29 DIAGNOSIS — Z880 Allergy status to penicillin: Secondary | ICD-10-CM | POA: Diagnosis not present

## 2019-09-29 DIAGNOSIS — Z882 Allergy status to sulfonamides status: Secondary | ICD-10-CM | POA: Diagnosis not present

## 2019-09-29 DIAGNOSIS — I129 Hypertensive chronic kidney disease with stage 1 through stage 4 chronic kidney disease, or unspecified chronic kidney disease: Secondary | ICD-10-CM | POA: Diagnosis not present

## 2019-09-29 DIAGNOSIS — E876 Hypokalemia: Secondary | ICD-10-CM

## 2019-09-29 DIAGNOSIS — I471 Supraventricular tachycardia: Principal | ICD-10-CM | POA: Insufficient documentation

## 2019-09-29 DIAGNOSIS — Z7952 Long term (current) use of systemic steroids: Secondary | ICD-10-CM

## 2019-09-29 DIAGNOSIS — Z7901 Long term (current) use of anticoagulants: Secondary | ICD-10-CM | POA: Diagnosis not present

## 2019-09-29 DIAGNOSIS — I4891 Unspecified atrial fibrillation: Secondary | ICD-10-CM | POA: Diagnosis not present

## 2019-09-29 DIAGNOSIS — M4800 Spinal stenosis, site unspecified: Secondary | ICD-10-CM

## 2019-09-29 DIAGNOSIS — Z888 Allergy status to other drugs, medicaments and biological substances status: Secondary | ICD-10-CM | POA: Insufficient documentation

## 2019-09-29 DIAGNOSIS — M353 Polymyalgia rheumatica: Secondary | ICD-10-CM | POA: Diagnosis not present

## 2019-09-29 DIAGNOSIS — I480 Paroxysmal atrial fibrillation: Secondary | ICD-10-CM | POA: Diagnosis present

## 2019-09-29 DIAGNOSIS — R000 Tachycardia, unspecified: Secondary | ICD-10-CM

## 2019-09-29 DIAGNOSIS — L930 Discoid lupus erythematosus: Secondary | ICD-10-CM | POA: Insufficient documentation

## 2019-09-29 DIAGNOSIS — Z79899 Other long term (current) drug therapy: Secondary | ICD-10-CM | POA: Insufficient documentation

## 2019-09-29 DIAGNOSIS — Z20822 Contact with and (suspected) exposure to covid-19: Secondary | ICD-10-CM | POA: Diagnosis not present

## 2019-09-29 DIAGNOSIS — Z881 Allergy status to other antibiotic agents status: Secondary | ICD-10-CM

## 2019-09-29 DIAGNOSIS — Z66 Do not resuscitate: Secondary | ICD-10-CM

## 2019-09-29 DIAGNOSIS — Z87891 Personal history of nicotine dependence: Secondary | ICD-10-CM | POA: Diagnosis not present

## 2019-09-29 DIAGNOSIS — R002 Palpitations: Secondary | ICD-10-CM | POA: Diagnosis present

## 2019-09-29 DIAGNOSIS — F411 Generalized anxiety disorder: Secondary | ICD-10-CM | POA: Diagnosis present

## 2019-09-29 DIAGNOSIS — I10 Essential (primary) hypertension: Secondary | ICD-10-CM

## 2019-09-29 LAB — CBC WITH DIFFERENTIAL/PLATELET
Abs Immature Granulocytes: 0.02 10*3/uL (ref 0.00–0.07)
Basophils Absolute: 0 10*3/uL (ref 0.0–0.1)
Basophils Relative: 1 %
Eosinophils Absolute: 0 10*3/uL (ref 0.0–0.5)
Eosinophils Relative: 0 %
HCT: 47.3 % — ABNORMAL HIGH (ref 36.0–46.0)
Hemoglobin: 15 g/dL (ref 12.0–15.0)
Immature Granulocytes: 0 %
Lymphocytes Relative: 11 %
Lymphs Abs: 0.7 10*3/uL (ref 0.7–4.0)
MCH: 32.4 pg (ref 26.0–34.0)
MCHC: 31.7 g/dL (ref 30.0–36.0)
MCV: 102.2 fL — ABNORMAL HIGH (ref 80.0–100.0)
Monocytes Absolute: 0.3 10*3/uL (ref 0.1–1.0)
Monocytes Relative: 4 %
Neutro Abs: 5 10*3/uL (ref 1.7–7.7)
Neutrophils Relative %: 84 %
Platelets: 154 10*3/uL (ref 150–400)
RBC: 4.63 MIL/uL (ref 3.87–5.11)
RDW: 13.1 % (ref 11.5–15.5)
WBC: 6 10*3/uL (ref 4.0–10.5)
nRBC: 0 % (ref 0.0–0.2)

## 2019-09-29 LAB — BASIC METABOLIC PANEL
Anion gap: 16 — ABNORMAL HIGH (ref 5–15)
BUN: 22 mg/dL (ref 8–23)
CO2: 21 mmol/L — ABNORMAL LOW (ref 22–32)
Calcium: 9.5 mg/dL (ref 8.9–10.3)
Chloride: 108 mmol/L (ref 98–111)
Creatinine, Ser: 1.15 mg/dL — ABNORMAL HIGH (ref 0.44–1.00)
GFR calc Af Amer: 50 mL/min — ABNORMAL LOW (ref >60)
GFR calc non Af Amer: 43 mL/min — ABNORMAL LOW (ref >60)
Glucose, Bld: 99 mg/dL (ref 70–99)
Potassium: 3.4 mmol/L — ABNORMAL LOW (ref 3.5–5.1)
Sodium: 145 mmol/L (ref 135–145)

## 2019-09-29 LAB — TSH: TSH: 1.643 u[IU]/mL (ref 0.350–4.500)

## 2019-09-29 LAB — MAGNESIUM: Magnesium: 1.9 mg/dL (ref 1.7–2.4)

## 2019-09-29 LAB — RESPIRATORY PANEL BY RT PCR (FLU A&B, COVID)
Influenza A by PCR: NEGATIVE
Influenza B by PCR: NEGATIVE
SARS Coronavirus 2 by RT PCR: NEGATIVE

## 2019-09-29 LAB — TROPONIN I (HIGH SENSITIVITY)
Troponin I (High Sensitivity): 11 ng/L (ref <18)
Troponin I (High Sensitivity): 36 ng/L — ABNORMAL HIGH (ref <18)

## 2019-09-29 LAB — D-DIMER, QUANTITATIVE: D-Dimer, Quant: 0.31 ug/mL-FEU (ref 0.00–0.50)

## 2019-09-29 MED ORDER — LISINOPRIL 10 MG PO TABS: 10.0000 mg | ORAL_TABLET | Freq: Every day | ORAL | Status: DC

## 2019-09-29 MED ORDER — BRINZOLAMIDE 1 % OP SUSP
1.0000 [drp] | Freq: Three times a day (TID) | OPHTHALMIC | Status: DC
  Administered 2019-09-30 (×2): 1 [drp] via OPHTHALMIC
  Filled 2019-09-29: qty 10

## 2019-09-29 MED ORDER — POTASSIUM CHLORIDE CRYS ER 20 MEQ PO TBCR: 40.0000 meq | EXTENDED_RELEASE_TABLET | Freq: Once | ORAL | Status: DC

## 2019-09-29 MED ORDER — DILTIAZEM HCL-DEXTROSE 125-5 MG/125ML-% IV SOLN (PREMIX)
5.0000 mg/h | INTRAVENOUS | Status: DC
  Administered 2019-09-29: 5 mg/h via INTRAVENOUS
  Administered 2019-09-30: 7.5 mg/h via INTRAVENOUS
  Filled 2019-09-29 (×2): qty 125

## 2019-09-29 MED ORDER — KETOROLAC TROMETHAMINE 0.5 % OP SOLN
1.0000 [drp] | Freq: Two times a day (BID) | OPHTHALMIC | Status: DC
  Administered 2019-09-30: 1 [drp] via OPHTHALMIC
  Filled 2019-09-29: qty 5

## 2019-09-29 MED ORDER — TRAMADOL-ACETAMINOPHEN 37.5-325 MG PO TABS
1.0000 | ORAL_TABLET | Freq: Two times a day (BID) | ORAL | Status: DC
  Administered 2019-09-29 – 2019-09-30 (×2): 1 via ORAL
  Filled 2019-09-29 (×2): qty 1

## 2019-09-29 MED ORDER — NETARSUDIL-LATANOPROST 0.02-0.005 % OP SOLN: 1.0000 [drp] | Freq: Every day | OPHTHALMIC | Status: DC

## 2019-09-29 MED ORDER — ALPRAZOLAM 0.25 MG PO TABS
0.2500 mg | ORAL_TABLET | Freq: Two times a day (BID) | ORAL | Status: DC
  Administered 2019-09-29: 0.25 mg via ORAL
  Filled 2019-09-29: qty 1

## 2019-09-29 MED ORDER — HYDROXYCHLOROQUINE SULFATE 200 MG PO TABS
200.0000 mg | ORAL_TABLET | Freq: Every day | ORAL | Status: DC
  Administered 2019-09-30: 200 mg via ORAL
  Filled 2019-09-29: qty 1

## 2019-09-29 MED ORDER — LISINOPRIL 10 MG PO TABS
10.0000 mg | ORAL_TABLET | Freq: Every day | ORAL | Status: DC
  Administered 2019-09-30: 10 mg via ORAL
  Filled 2019-09-29: qty 1

## 2019-09-29 MED ORDER — CYCLOSPORINE 0.05 % OP EMUL
1.0000 [drp] | Freq: Two times a day (BID) | OPHTHALMIC | Status: DC
  Administered 2019-09-30: 1 [drp] via OPHTHALMIC
  Filled 2019-09-29 (×3): qty 1

## 2019-09-29 MED ORDER — APIXABAN 2.5 MG PO TABS
2.5000 mg | ORAL_TABLET | Freq: Two times a day (BID) | ORAL | Status: DC
  Administered 2019-09-29 – 2019-09-30 (×2): 2.5 mg via ORAL
  Filled 2019-09-29 (×3): qty 1

## 2019-09-29 MED ORDER — KETOROLAC TROMETHAMINE 0.5 % OP SOLN: 1.0000 [drp] | Freq: Four times a day (QID) | OPHTHALMIC | Status: DC

## 2019-09-29 MED ORDER — SODIUM CHLORIDE 0.9 % IV BOLUS
500.0000 mL | Freq: Once | INTRAVENOUS | Status: AC
  Administered 2019-09-29: 500 mL via INTRAVENOUS

## 2019-09-29 MED ORDER — DILTIAZEM LOAD VIA INFUSION
10.0000 mg | Freq: Once | INTRAVENOUS | Status: AC
  Administered 2019-09-29: 10 mg via INTRAVENOUS
  Filled 2019-09-29: qty 10

## 2019-09-29 MED ORDER — POTASSIUM CHLORIDE CRYS ER 20 MEQ PO TBCR
40.0000 meq | EXTENDED_RELEASE_TABLET | Freq: Four times a day (QID) | ORAL | Status: AC
  Administered 2019-09-29 – 2019-09-30 (×2): 40 meq via ORAL
  Filled 2019-09-29 (×2): qty 2

## 2019-09-29 MED ORDER — AMLODIPINE BESYLATE 2.5 MG PO TABS
2.5000 mg | ORAL_TABLET | Freq: Every day | ORAL | Status: DC
  Administered 2019-09-30: 2.5 mg via ORAL
  Filled 2019-09-29: qty 1

## 2019-09-29 MED ORDER — PREDNISONE 5 MG PO TABS
5.0000 mg | ORAL_TABLET | Freq: Every day | ORAL | Status: DC
  Administered 2019-09-30: 09:00:00 5 mg via ORAL
  Filled 2019-09-29: qty 1

## 2019-09-29 MED ORDER — PANTOPRAZOLE SODIUM 40 MG PO TBEC
40.0000 mg | DELAYED_RELEASE_TABLET | Freq: Every day | ORAL | Status: DC
  Administered 2019-09-30: 40 mg via ORAL
  Filled 2019-09-29: qty 1

## 2019-09-29 NOTE — ED Notes (Signed)
Admitting paged for pt elevated troponin.

## 2019-09-29 NOTE — ED Provider Notes (Signed)
[facility] EMERGENCY DEPARTMENT Provider Note   CSN: [id number] Arrival date & time: 09/29/19  1708     History Chief Complaint  Patient presents with  . Irregular Heart Beat    Jodi Snyder is a 86 y.o. female.  Patient with history of atrial fibrillation, hypertension, polymyalgia on chronic prednisone here with palpitations and pounding in her chest.  She noticed this around 3:15 PM when she got out of bed.  States she was in bed because her power was out and she was cold.  While she was walking to the kitchen she began to feel her heart racing.  She checked her blood pressure which was elevated and her heart rate was 185.  She took 30 of p.o. Cardizem which she takes as needed for atrial fibrillation and EMS was called.  EMS also gave her 10 mg of Cardizem.  She states she feels somewhat improved with still having palpitations in her chest.  Denies any chest pain or shortness of breath.  Denies any missed doses of her Eliquis.  States she only takes Cardizem on an as-needed basis. Denies any dizziness or lightheadedness.  No focal weakness, numbness or tingling.  She was seen in the ED 6 weeks ago for similar presentation which was attributed to anxiety.  States she still takes her Xanax at night.  The history is provided by the patient and the EMS personnel.       Past Medical History:  Diagnosis Date  . Atrial fibrillation (HCC)   . Hypertension   . Immune system disorder (HCC)   . Polymyalgia [facility])     Patient Active Problem List   Diagnosis Date Noted  . Hypokalemia   . 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HCC) 08/19/2018  . Asymptomatic microscopic hematuria 01/05/2018  . Paroxysmal atrial fibrillation (HCC) 01/05/2018  . Scoliosis 10/18/2017  . Thoracogenic scoliosis of thoracolumbar region 10/08/2017  . Glaucoma 06/21/2016  . Drug therapy 01/28/2016  . Benign hypertension with CKD (chronic kidney disease) stage III 01/16/2016  . Discoid lupus  erythematosus 01/16/2016  . Generalized anxiety disorder 01/16/2016  . Osteoporosis 01/16/2016  . Polymyalgia rheumatica (HCC) 01/16/2016  . Closed fracture of distal end of left radius 11/11/2014  . Dry eye syndrome 08/03/2012  . Other states following surgery of eye and adnexa 04/21/2012  . 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 Low-tension glaucoma, unspecified eye, stage unspecified 04/07/2012  . Pseudophakia of both eyes 04/07/2012    Past Surgical History:  Procedure Laterality Date  . ABDOMINAL HYSTERECTOMY    . BLADDER SURGERY    . OOPHORECTOMY       OB History   No obstetric history on file.     Family History  Problem Relation Age of Onset  . Heart attack Mother   . Stroke Father   . Anuerysm Father     Social History   Tobacco Use  . Smoking status: Former Games developer  . Smokeless tobacco: Never Used  Substance Use Topics  . Alcohol use: No  . Drug use: No    Home Medications Prior to Admission medications   Medication Sig Start Date End Date Taking? Authorizing Provider  ALPRAZolam Prudy Feeler) 0.5 MG tablet Take 0.25 mg by mouth 2 (two) times daily. Supper time and bedtime 04/20/18   [provider]  amLODipine (NORVASC) 2.5 MG tablet Take 1 tablet (2.5 mg total) by mouth daily. 03/20/19   Camnitz, Andree Coss, MD  Biotin (BIOTIN 5000) 5 MG CAPS Take 5 mg by  mouth daily.    [provider]  brinzolamide (AZOPT) 1 % ophthalmic suspension Place 1 drop into both eyes 3 (three) times daily.     [provider]  cycloSPORINE (RESTASIS) 0.05 % ophthalmic emulsion Place 1 drop into both eyes 2 (two) times daily.     [provider]  diltiazem (CARDIZEM) 30 MG tablet Take 1 tablet every 4 hours AS NEEDED for afib heart rate >100 08/21/18   Rehman, Areeg N, DO  ELIQUIS 2.5 MG TABS tablet TAKE 1 TABLET BY MOUTH TWICE DAILY 09/03/19   Camnitz, Andree Coss, MD  esomeprazole (NEXIUM) 40 MG capsule Take 40 mg by mouth daily.     [provider]  hydroxychloroquine (PLAQUENIL) 200 MG tablet Take 200 mg by mouth daily.    [provider]  Hypromellose (ARTIFICIAL TEARS OP) Place 1 drop into both eyes daily as needed (dry eyes).    [provider]  ketorolac (ACULAR) 0.5 % ophthalmic solution Place 1 drop into both eyes 4 (four) times daily. 06/21/19   [provider]  lisinopril (ZESTRIL) 10 MG tablet Take 1 tablet (10 mg total) by mouth daily. 03/20/19   Camnitz, Andree Coss, MD  Multiple Vitamins-Minerals (ICAPS AREDS 2 PO) Take 2 capsules by mouth 2 (two) times daily.    [provider]  predniSONE (DELTASONE) 5 MG tablet Take 5 mg by mouth daily.  01/13/18   [provider]  traMADol-acetaminophen (ULTRACET) 37.5-325 MG tablet Take 1 tablet by mouth 2 (two) times daily.     [provider]    Allergies    Penicillins, Brimonidine, Other, Penicillin g, Timolol maleate, Sulfa antibiotics, and Sulfasalazine  Review of Systems   Review of Systems  Constitutional: Negative for activity change, appetite change and fever.  HENT: Negative for congestion.   Respiratory: Negative for chest tightness and shortness of breath.   Cardiovascular: Positive for palpitations.  Gastrointestinal: Negative for abdominal pain and vomiting.  Genitourinary: Negative for dysuria and hematuria.  Musculoskeletal: Negative for arthralgias, back pain and myalgias.   all other systems are negative except as noted in the HPI and PMH.    Physical Exam Updated Vital Signs BP (!) 149/19 (BP Location: Right Arm)   Pulse (!) 117   Temp (!) 97.3 F (36.3 C) (Oral)   Resp 18   Ht 5\' 3"  (1.6 m)   Wt 45.4 kg   SpO2 100%   BMI 17.71 kg/m   Physical Exam Vitals and nursing note reviewed.  Constitutional:      General: She is not in acute distress.    Appearance: She is well-developed.     Comments: Elderly and frail  HENT:     Head: Normocephalic and atraumatic.     Mouth/Throat:      Pharynx: No oropharyngeal exudate.  Eyes:     Conjunctiva/sclera: Conjunctivae normal.     Pupils: Pupils are equal, round, and reactive to light.  Neck:     Comments: No meningismus. Cardiovascular:     Rate and Rhythm: Regular rhythm. Tachycardia present.     Heart sounds: Normal heart sounds. No murmur.     Comments: Regular 120s Pulmonary:     Effort: Pulmonary effort is normal. No respiratory distress.     Breath sounds: Normal breath sounds.  Abdominal:     Palpations: Abdomen is soft.     Tenderness: There is no abdominal tenderness. There is no guarding or rebound.  Musculoskeletal:  General: No tenderness. Normal range of motion.     Cervical back: Normal range of motion and neck supple.  Skin:    General: Skin is warm.  Neurological:     Mental Status: She is alert and oriented to person, place, and time.     Cranial Nerves: No cranial nerve deficit.     Motor: No abnormal muscle tone.     Coordination: Coordination normal.     Comments: No ataxia on finger to nose bilaterally. No pronator drift. 5/5 strength throughout. CN 2-12 intact.Equal grip strength. Sensation intact.   Psychiatric:        Behavior: Behavior normal.     ED Results / Procedures / Treatments   Labs (all labs ordered are listed, but only abnormal results are displayed) Labs Reviewed  CBC WITH DIFFERENTIAL/PLATELET - Abnormal; Notable for the following components:      Result Value   HCT 47.3 (*)    MCV 102.2 (*)    All other components within normal limits  BASIC METABOLIC PANEL - Abnormal; Notable for the following components:   Potassium 3.4 (*)    CO2 21 (*)    Creatinine, Ser 1.15 (*)    GFR calc non Af Amer 43 (*)    GFR calc Af Amer 50 (*)    Anion gap 16 (*)    All other components within normal limits  TROPONIN I (HIGH SENSITIVITY) - Abnormal; Notable for the following components:   Troponin I (High Sensitivity) 36 (*)    All other components within normal limits    RESPIRATORY PANEL BY RT PCR (FLU A&B, COVID)  TSH  D-DIMER, QUANTITATIVE (NOT AT [facility])  MAGNESIUM  BASIC METABOLIC PANEL  CBC  TROPONIN I (HIGH SENSITIVITY)    EKG EKG Interpretation  Date/Time:  Saturday September 29 2019 17:24:23 EST Ventricular Rate:  117 PR Interval:    QRS Duration: 75 QT Interval:  358 QTC Calculation: 500 R Axis:   70 Text Interpretation: Ectopic atrial tachycardia, unifocal Posterior infarct, recent Lateral leads are also involved Confirmed by Glynn Octave [phone]) on 09/29/2019 5:55:29 PM   Radiology DG Chest Portable 1 View  Result Date: 09/29/2019 CLINICAL DATA:  Weakness, tachycardia, atrial fibrillation, shortness of breath. EXAM: PORTABLE CHEST 1 VIEW COMPARISON:  Chest x-ray dated 08/19/2018. FINDINGS: Heart size and mediastinal contours are within normal limits. Aortic atherosclerosis. Lungs are hyperexpanded. Chronic scarring/fibrosis again noted at the lung apices. No confluent opacity to suggest a developing pneumonia. No pleural effusion or pneumothorax is seen. Osseous structures about the chest are unremarkable. IMPRESSION: 1. No acute findings. No evidence of pneumonia or pulmonary edema. 2. Hyperexpanded lungs indicating COPD. 3. Aortic atherosclerosis. Electronically Signed   By: Bary Richard M.D.   On: 09/29/2019 17:43    Procedures .Critical Care Performed by: Glynn Octave, MD Authorized by: Glynn Octave, MD   Critical care provider statement:    Critical care time (minutes):  35   Critical care was time spent personally by me on the following activities:  Discussions with consultants, evaluation of patient's response to treatment, examination of patient, ordering and performing treatments and interventions, ordering and review of laboratory studies, ordering and review of radiographic studies, pulse oximetry, re-evaluation of patient's condition, obtaining history from patient or surrogate and review of old charts    (including critical care time)  Medications Ordered in ED Medications - No data to display  ED Course  I have reviewed the triage vital signs and the nursing notes.  Pertinent labs & imaging results that were available during my care of the patient were reviewed by me and considered in my medical decision making (see chart for details).    MDM Rules/Calculators/A&P                     Patient with history of paroxysmal atrial fibrillation on Eliquis here with irregular heartbeat and tachycardia from home.  She has rate controlled atrial tachycardia versus atrial flutter on arrival with diffuse ST depressions.  Denies chest pain.  She received Cardizem at home as well as IV Cardizem from EMS.  Denies any missed doses of her Eliquis.  EKG reviewed with cardiology Dr. Georgette Shell.  She agrees this appears to be atrial tachycardia rather than flutter.  Q waves are present.  Does not recommend cardioversion given her stable blood pressure and mental status.  IV cardizem gtt for rate control.  Labs with mild hypokalemia.  Blood pressure and mental status stable. Will hydrate.  Admission d/w internal medicine residents with cardiology consultations. Final Clinical Impression(s) / ED Diagnoses Final diagnoses:  Atrial tachycardia [facility])    Rx / DC Orders ED Discharge Orders    None       Teya Otterson, Annie Main, MD 09/30/19 [phone]

## 2019-09-29 NOTE — ED Notes (Signed)
Admitting physician notified of pt elevated troponin.

## 2019-09-29 NOTE — Consult Note (Signed)
Cardiology Consultation:   Patient ID: Jodi Snyder MRN: [id number]; DOB: Dec 26, 1931  Admit date: 09/29/2019 Date of Consult: 09/29/2019  Primary Care Provider: Elspeth Cho., MD Primary Cardiologist: Will Jorja Loa, MD Primary Electrophysiologist:  None    Patient Profile:   Jodi Snyder is a 86 y.o. female with a hx of HTN, polymyalgia, atrial fibrillation (on eliquis), ectopic atrial tachycardia who is being seen today for the evaluation of tachycardia at the request of Dr. Manus Gunning.  History of Present Illness:   Jodi Snyder is an 86 year old woman with the above medical history who presents due to palpitations.  Per the patient, she was feeling well until she developed palpitations at approximately 3 PM.  The patient reports that her power had gone out and so she remained in bed for most of the day and ate/drank almost nothing all day.  Her power came back on and she got out of bed to make oatmeal and she noted sudden onset of palpitations.  She took her blood pressure which was elevated and revealed a heart rate of 185.  She took her 30 mg p.o. Cardizem which is prescribed as needed and with ongoing palpitations called EMS.  Upon EMS arrival, her heart rate remained in the 180s and she was given 10 mg of IV diltiazem.  She denies chest pain, shortness of breath, orthopnea, PND, syncope, diaphoresis.  She reports that she has otherwise been feeling well denies fevers, chills, urinary discomfort, abdominal pain.  She does report a scant amount of bright red blood on toilet paper following bowel movements but no blood in the stool or dark tarry bowel movements.  Notably, the patient follows up with Dr. Elberta Fortis for atrial fibrillation.  She is treated with Eliquis and diltiazem as needed for rate control.  At her last clinic visit on 06/25/2019 she was noted to be in sinus rhythm.  She has previously presented similar to her presentation today.  She was seen approximately 1 year ago  on 08/19/2026 due to palpitations and was found to be in an ectopic atrial rhythm.  This was felt to be secondary to hypokalemia and hypovolemia in the setting of hydrochlorothiazide use.  Heart Pathway Score:     Past Medical History:  Diagnosis Date  . Atrial fibrillation (HCC)   . Hypertension   . Immune system disorder (HCC)   . Polymyalgia (HCC)     Past Surgical History:  Procedure Laterality Date  . ABDOMINAL HYSTERECTOMY    . BLADDER SURGERY    . OOPHORECTOMY       Home Medications:  Prior to Admission medications   Medication Sig Start Date End Date Taking? Authorizing Provider  ALPRAZolam Prudy Feeler) 0.5 MG tablet Take 0.25 mg by mouth 2 (two) times daily. Supper time and bedtime 04/20/18  Yes [provider]  amLODipine (NORVASC) 2.5 MG tablet Take 1 tablet (2.5 mg total) by mouth daily. 03/20/19  Yes Camnitz, Andree Coss, MD  Biotin (BIOTIN 5000) 5 MG CAPS Take 5 mg by mouth daily.   Yes [provider]  brinzolamide (AZOPT) 1 % ophthalmic suspension Place 1 drop into both eyes 3 (three) times daily.    Yes [provider]  cycloSPORINE (RESTASIS) 0.05 % ophthalmic emulsion Place 1 drop into both eyes 2 (two) times daily.    Yes [provider]  diltiazem (CARDIZEM) 30 MG tablet Take 1 tablet every 4 hours AS NEEDED for afib heart rate >100 08/21/18  Yes Rehman, Areeg N, DO  ELIQUIS  2.5 MG TABS tablet TAKE 1 TABLET BY MOUTH TWICE DAILY 09/03/19  Yes Camnitz, Will Hassell Done, MD  esomeprazole (NEXIUM) 40 MG capsule Take 40 mg by mouth daily.    Yes [provider]  hydroxychloroquine (PLAQUENIL) 200 MG tablet Take 200 mg by mouth daily.   Yes [provider]  ketorolac (ACULAR) 0.4 % SOLN Apply 1 drop to eye 2 (two) times daily. 09/04/19  Yes [provider]  lisinopril (ZESTRIL) 10 MG tablet Take 1 tablet (10 mg total) by mouth daily. 03/20/19  Yes Camnitz, Will Hassell Done, MD  metroNIDAZOLE (METROCREAM) 0.75 % cream Apply 1  application topically daily. 08/08/19  Yes [provider]  Multiple Vitamins-Minerals (ICAPS AREDS 2 PO) Take 2 capsules by mouth 2 (two) times daily.   Yes [provider]  predniSONE (DELTASONE) 5 MG tablet Take 5 mg by mouth daily.  01/13/18  Yes [provider]  ROCKLATAN 0.02-0.005 % SOLN Apply 1 drop to eye at bedtime. 08/31/19  Yes [provider]  traMADol-acetaminophen (ULTRACET) 37.5-325 MG tablet Take 1 tablet by mouth 2 (two) times daily.    Yes [provider]  Hypromellose (ARTIFICIAL TEARS OP) Place 1 drop into both eyes daily as needed (dry eyes).    [provider]  ketorolac (ACULAR) 0.5 % ophthalmic solution Place 1 drop into both eyes 4 (four) times daily. 06/21/19   [provider]    Inpatient Medications: Scheduled Meds: . diltiazem  10 mg Intravenous Once   Continuous Infusions: . diltiazem (CARDIZEM) infusion     PRN Meds:   Allergies:    Allergies  Allergen Reactions  . Penicillins Hives and Swelling    Has patient had a PCN reaction causing immediate rash, facial/tongue/throat swelling, SOB or lightheadedness with hypotension: Yes Has patient had a PCN reaction causing severe rash involving mucus membranes or skin necrosis: No Has patient had a PCN reaction that required hospitalization: No Has patient had a PCN reaction occurring within the last 10 years: No If all of the above answers are "NO", then may proceed with Cephalosporin use.   . Brimonidine Other (See Comments)  . Other     Nuts causes blisters No spices coffee  . Penicillin G Other (See Comments)  . Timolol Maleate Other (See Comments)    Pt becomes faint  . Sulfa Antibiotics Rash  . Sulfasalazine Rash    Social History:   Social History   Socioeconomic History  . Marital status: Widowed    Spouse name: Not on file  . Number of children: Not on file  . Years of education: Not on file  . Highest education level: Not on  file  Occupational History  . Not on file  Tobacco Use  . Smoking status: Former Research scientist (life sciences)  . Smokeless tobacco: Never Used  Substance and Sexual Activity  . Alcohol use: No  . Drug use: No  . Sexual activity: Not on file  Other Topics Concern  . Not on file  Social History Narrative  . Not on file   Social Determinants of Health   Financial Resource Strain:   . Difficulty of Paying Living Expenses: Not on file  Food Insecurity:   . Worried About Charity fundraiser in the Last Year: Not on file  . Ran Out of Food in the Last Year: Not on file  Transportation Needs:   . Lack of Transportation (Medical): Not on file  . Lack of Transportation (Non-Medical): Not on file  Physical  Activity:   . Days of Exercise per Week: Not on file  . Minutes of Exercise per Session: Not on file  Stress:   . Feeling of Stress : Not on file  Social Connections:   . Frequency of Communication with Friends and Family: Not on file  . Frequency of Social Gatherings with Friends and Family: Not on file  . Attends Religious Services: Not on file  . Active Member of Clubs or Organizations: Not on file  . Attends Banker Meetings: Not on file  . Marital Status: Not on file  Intimate Partner Violence:   . Fear of Current or Ex-Partner: Not on file  . Emotionally Abused: Not on file  . Physically Abused: Not on file  . Sexually Abused: Not on file    Family History:   Family History  Problem Relation Age of Onset  . Heart attack Mother   . Stroke Father   . Anuerysm Father      ROS:  Please see the history of present illness.  All other ROS reviewed and negative.     Physical Exam/Data:   Vitals:   09/29/19 1815 09/29/19 1934 09/29/19 1945 09/29/19 2000  BP: 131/88 (!) 139/95 (!) 143/99 (!) 142/88  Pulse:  (!) 121 (!) 121 (!) 119  Resp: 14 17 20 14   Temp:      TempSrc:      SpO2:  100% 100% 100%  Weight:      Height:       No intake or output data in the 24 hours ending  09/29/19 2010 Last 3 Weights 09/29/2019 08/15/2019 06/25/2019  Weight (lbs) 100 lb 100 lb 11.2 oz 100 lb  Weight (kg) 45.36 kg 45.677 kg 45.36 kg     Body mass index is 17.71 kg/m.  General: Frail-appearing elderly woman, in no acute distress HEENT: normal Lymph: no adenopathy Neck: no JVD Cardiac: Tachycardic.  Regular rhythm.  Normal S1 and S2 without murmurs/rubs/gallops Lungs:  clear to auscultation bilaterally, no wheezing, rhonchi or rales  Abd: soft, nontender, no hepatomegaly  Ext: no edema Skin: warm and dry  Neuro:  CNs 2-12 intact, no focal abnormalities noted Psych:  Normal affect   EKG:  The EKG was personally reviewed and demonstrates: ectopic atrial tachycardia; no acute ischemic changes  Relevant CV Studies: 02/09/17 TTE - Left ventricle: The cavity size was normal. Wall thickness was  normal. Systolic function was normal. The estimated ejection  fraction was in the range of 60% to 65%. Wall motion was normal;  there were no regional wall motion abnormalities. Features are  consistent with a pseudonormal left ventricular filling pattern,  with concomitant abnormal relaxation and increased filling  pressure (grade 2 diastolic dysfunction).  - Aortic valve: Mildly calcified annulus.  - Mitral valve: There was mild regurgitation.  - Pulmonary arteries: Systolic pressure was mildly increased. PA  peak pressure: 32 mm Hg (S).   Laboratory Data:  High Sensitivity Troponin:   Recent Labs  Lab 09/29/19 1730  TROPONINIHS 11     Chemistry Recent Labs  Lab 09/29/19 1730  NA 145  K 3.4*  CL 108  CO2 21*  GLUCOSE 99  BUN 22  CREATININE 1.15*  CALCIUM 9.5  GFRNONAA 43*  GFRAA 50*  ANIONGAP 16*    No results for input(s): PROT, ALBUMIN, AST, ALT, ALKPHOS, BILITOT in the last 168 hours. Hematology Recent Labs  Lab 09/29/19 1730  WBC 6.0  RBC 4.63  HGB 15.0  HCT  47.3*  MCV 102.2*  MCH 32.4  MCHC 31.7  RDW 13.1  PLT 154   BNPNo  results for input(s): BNP, PROBNP in the last 168 hours.  DDimer  Recent Labs  Lab 09/29/19 1730  DDIMER 0.31     Radiology/Studies:  DG Chest Portable 1 View  Result Date: 09/29/2019 CLINICAL DATA:  Weakness, tachycardia, atrial fibrillation, shortness of breath. EXAM: PORTABLE CHEST 1 VIEW COMPARISON:  Chest x-ray dated 08/19/2018. FINDINGS: Heart size and mediastinal contours are within normal limits. Aortic atherosclerosis. Lungs are hyperexpanded. Chronic scarring/fibrosis again noted at the lung apices. No confluent opacity to suggest a developing pneumonia. No pleural effusion or pneumothorax is seen. Osseous structures about the chest are unremarkable. IMPRESSION: 1. No acute findings. No evidence of pneumonia or pulmonary edema. 2. Hyperexpanded lungs indicating COPD. 3. Aortic atherosclerosis. Electronically Signed   By: Bary Richard M.D.   On: 09/29/2019 17:43   Assessment and Plan:  Ms. Tyer is a 87 y.o. female with a hx of HTN, polymyalgia, atrial fibrillation (on eliquis), ectopic atrial tachycardia who is being seen today for the evaluation of tachycardia.   # Palpitations # Ectopic atrial tachycardia She presented due to palpitations and HR in the 180s, ECG consistent with an ectopic atrial tachycardia, similar to her presentation approximately 1 year ago.  This was likely exacerbated in the setting of poor p.o. intake today and hypokalemia with potassium of 3.4 at presentation.  She was started on a diltiazem drip in the emergency room and heart rate has improved to the 120s.  She is now asymptomatic. -Would give gentle IV fluids in the setting of poor p.o. intake -Supplement potassium to >4 -Continue diltiazem drip at this time -As the patient remains hemodynamically stable and is asymptomatic, would not recommend cardioversion tonight but should she develop recurrent symptoms, significant tachycardia or if ectopic rhythm is persistent overnight despite the above  management, this could be considered. During her prior admission, with conservative management she returned to sinus rhythm.  # Paroxysmal atrial fibrillation No atrial fibrillation at the time of presentation.  The patient reports that she has not missed any recent doses of Eliquis -Rate control as above -Continue home Eliquis 2.5 mg twice daily  # HTN BP elevated at time of presentation, remains mildly elevated. - Would restart home amlodipine and lisinopril in the AM - If additional BP medication is needed, could consider spironolactone given recurrent hypokalemia   For questions or updates, please contact [facility] Please consult [email or web address] for contact info under   Signed, Starlyn Skeans, MD  09/29/2019 8:10 PM
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
## 2019-09-29 NOTE — ED Triage Notes (Signed)
The pt arrived by gems from home where she lives alone    approx 1530 she felt her heart racing.  Ems foound af  The pt had taKEN CARDIZEM 30 mg prior to ems arriving.Marland Kitchen ems gave her  10 mg of caRDIZEM 10MG  iv   Iv per ems

## 2019-09-29 NOTE — ED Notes (Signed)
Pt was upset after speaking with doctor. Pt reported doctor told them they would be staying overnight. As a comfort measure for this pt, this user showed pt how to use the phone and pt appeared happy to be able to call family members to provide updates.

## 2019-09-30 ENCOUNTER — Other Ambulatory Visit: Payer: Self-pay | Admitting: Internal Medicine

## 2019-09-30 ENCOUNTER — Other Ambulatory Visit: Payer: Self-pay

## 2019-09-30 DIAGNOSIS — I471 Supraventricular tachycardia: Secondary | ICD-10-CM

## 2019-09-30 DIAGNOSIS — N183 Chronic kidney disease, stage 3 unspecified: Secondary | ICD-10-CM

## 2019-09-30 DIAGNOSIS — I129 Hypertensive chronic kidney disease with stage 1 through stage 4 chronic kidney disease, or unspecified chronic kidney disease: Secondary | ICD-10-CM | POA: Diagnosis not present

## 2019-09-30 DIAGNOSIS — I480 Paroxysmal atrial fibrillation: Secondary | ICD-10-CM

## 2019-09-30 LAB — BASIC METABOLIC PANEL
Anion gap: 11 (ref 5–15)
BUN: 19 mg/dL (ref 8–23)
CO2: 22 mmol/L (ref 22–32)
Calcium: 9 mg/dL (ref 8.9–10.3)
Chloride: 110 mmol/L (ref 98–111)
Creatinine, Ser: 1.01 mg/dL — ABNORMAL HIGH (ref 0.44–1.00)
GFR calc Af Amer: 59 mL/min — ABNORMAL LOW (ref >60)
GFR calc non Af Amer: 51 mL/min — ABNORMAL LOW (ref >60)
Glucose, Bld: 84 mg/dL (ref 70–99)
Potassium: 4.1 mmol/L (ref 3.5–5.1)
Sodium: 143 mmol/L (ref 135–145)

## 2019-09-30 LAB — CBC
HCT: 43.3 % (ref 36.0–46.0)
Hemoglobin: 14 g/dL (ref 12.0–15.0)
MCH: 32.7 pg (ref 26.0–34.0)
MCHC: 32.3 g/dL (ref 30.0–36.0)
MCV: 101.2 fL — ABNORMAL HIGH (ref 80.0–100.0)
Platelets: 157 10*3/uL (ref 150–400)
RBC: 4.28 MIL/uL (ref 3.87–5.11)
RDW: 13.2 % (ref 11.5–15.5)
WBC: 6.9 10*3/uL (ref 4.0–10.5)
nRBC: 0 % (ref 0.0–0.2)

## 2019-09-30 MED ORDER — ACETAMINOPHEN 325 MG PO TABS
650.0000 mg | ORAL_TABLET | Freq: Four times a day (QID) | ORAL | Status: DC | PRN
  Filled 2019-09-30: qty 2

## 2019-09-30 MED ORDER — DILTIAZEM HCL ER COATED BEADS 120 MG PO CP24
120.0000 mg | ORAL_CAPSULE | Freq: Every day | ORAL | Status: DC
  Administered 2019-09-30: 120 mg via ORAL
  Filled 2019-09-30: qty 1

## 2019-09-30 MED ORDER — DILTIAZEM HCL ER COATED BEADS 120 MG PO CP24: 120.0000 mg | ORAL_CAPSULE | Freq: Every day | ORAL | 0 refills | Status: DC

## 2019-09-30 MED ORDER — DILTIAZEM HCL 30 MG PO TABS: ORAL_TABLET | ORAL | 1 refills | Status: DC

## 2019-09-30 NOTE — Progress Notes (Signed)
Discussed plan of care with RN caring for patient. Po Cardizem given at time of note, She will d/c Cardizem gtt at appropriate time. Cont with plan of care

## 2019-09-30 NOTE — Plan of Care (Signed)
  Problem: Education: Goal: Knowledge of General Education information will improve Description: Including pain rating scale, medication(s)/side effects and non-pharmacologic comfort measures Outcome: Adequate for Discharge   Problem: Health Behavior/Discharge Planning: Goal: Ability to manage health-related needs will improve Outcome: Completed/Met   Problem: Clinical Measurements: Goal: Ability to maintain clinical measurements within normal limits will improve Outcome: Completed/Met Goal: Will remain free from infection Outcome: Completed/Met Goal: Diagnostic test results will improve Outcome: Completed/Met Goal: Respiratory complications will improve Outcome: Completed/Met   Problem: Activity: Goal: Risk for activity intolerance will decrease Outcome: Completed/Met   Problem: Pain Managment: Goal: General experience of comfort will improve Outcome: Completed/Met   Problem: Elimination: Goal: Will not experience complications related to bowel motility Outcome: Completed/Met Goal: Will not experience complications related to urinary retention Outcome: Not Applicable   Problem: Cardiac: Goal: Ability to achieve and maintain adequate cardiopulmonary perfusion will improve Outcome: Completed/Met   Problem: Health Behavior/Discharge Planning: Goal: Ability to safely manage health-related needs after discharge will improve Outcome: Completed/Met   Problem: Activity: Goal: Ability to tolerate increased activity will improve Outcome: Completed/Met

## 2019-09-30 NOTE — Discharge Instructions (Signed)
Jodi Snyder,   It has been a pleasure working with you and we are glad you're feeling better. You were hospitalized for elevated heart rates. You were treated with diltiazem infusion while you were in the hospital. The cardiologist evaluated you and they would like you to start taking diltiazem 120 mg daily and continue the diltiazem 30-60 mg as needed. You will need to follow up with them for further evaluation.    Follow up with your primary care provider in 1-2 weeks  If your symptoms worsen or you develop new symptoms, please seek medical help whether it is your primary care provider or emergency department.  If you have any questions about this hospitalization please call [phone].

## 2019-09-30 NOTE — Progress Notes (Signed)
   Subjective: Patient reported that she was having significant back and leg pain since yesterday, she has a history of scoliosis and reports that she was in the same position all night. She was moved up into the bed and reports feeling better after being repositioned.   Objective:  Vital signs in last 24 hours: Vitals:   09/30/19 0216 09/30/19 0312 09/30/19 0514 09/30/19 0515  BP: 124/81  125/75   Pulse:    67  Resp: 20  18   Temp:    97.7 F (36.5 C)  TempSrc:    Oral  SpO2:    98%  Weight:  42.7 kg    Height:       Gen: crying on approach, elderly  CV: Regular rate and rhythm,no murmurs Abd: Soft nontender active bowel sounds Skin: warm , dry  Assessment/Plan:  Principal Problem:   Ectopic atrial tachycardia (HCC) Active Problems:   Benign hypertension with CKD (chronic kidney disease) stage III   Discoid lupus erythematosus   Generalized anxiety disorder   Paroxysmal atrial fibrillation (HCC)   Polymyalgia rheumatica (HCC)  Jodi Snyder is a 86 year old female with a past medical history of atrial fibrillation, atrial ectopic tachycardia, hypertension, CKD stage III, discoid lupus and PMR on chronic prednisone, spinal stenosis, who presented to ED for evaluation of palpitations and admitted for supraventricular tachycardia.   #Supraventricular tachycardia Patient was given diltiazem infusion overnight and heart rate was 80 bpm morning exam. Evaluated by Dr. Johney Frame with cardiology.  P: Diltiazem CD 120 mg daily -Continue diltiazem 30 mg 1 to 2 tablets every 4 hours as needed in addition at home  #Afib -Continue Eliquis 2.5 mg twice daily   #HTN -Continue home amlodipine  Prior to Admission Living Arrangement: home Anticipated Discharge Location:home Barriers to Discharge: none Dispo: Anticipated discharge in approximately today  Thurmon Fair, MD PGY1

## 2019-09-30 NOTE — Consult Note (Signed)
ELECTROPHYSIOLOGY CONSULT NOTE    Primary Care Physician: Elspeth Cho., MD Referring Physician:  Dr Andrey Farmer  Admit Date: 09/29/2019  Reason for consultation:  SVT  Jodi Snyder is a 86 y.o. female with a h/o atrial tachycardia and atrial fibrillation who is admitted with tachypalpitations.  She reports that her power went out yesterday.  When it returned, she was very excited and jumped up quickly.  She developed abrupt onset of tachy palpitations.  She tried diltiazem 30mg  po x 1 which was not effective.  She therefore presented for further evaluation.  She was found to have SVT, likely due to Atach. She was placed on IV diltiazem and has converted to sinus rhythm.  Today, she denies symptoms of palpitations, chest pain, shortness of breath, orthopnea, PND, lower extremity edema, dizziness, presyncope, syncope, or neurologic sequela. The patient is tolerating medications without difficulties and is otherwise without complaint today.   Past Medical History:  Diagnosis Date  . Atrial fibrillation (HCC)   . Hypertension   . Immune system disorder (HCC)   . Polymyalgia (HCC)    Past Surgical History:  Procedure Laterality Date  . ABDOMINAL HYSTERECTOMY    . BLADDER SURGERY    . OOPHORECTOMY      . ALPRAZolam  0.25 mg Oral BID  . amLODipine  2.5 mg Oral Daily  . apixaban  2.5 mg Oral BID  . brinzolamide  1 drop Both Eyes TID  . cycloSPORINE  1 drop Both Eyes BID  . diltiazem  120 mg Oral Daily  . hydroxychloroquine  200 mg Oral Daily  . ketorolac  1 drop Both Eyes BID  . lisinopril  10 mg Oral Daily  . Netarsudil-Latanoprost  1 drop Ophthalmic QHS  . pantoprazole  40 mg Oral Daily  . predniSONE  5 mg Oral Daily  . traMADol-acetaminophen  1 tablet Oral BID     Allergies  Allergen Reactions  . Penicillins Hives and Swelling    Has patient had a PCN reaction causing immediate rash, facial/tongue/throat swelling, SOB or lightheadedness with hypotension:  Yes Has patient had a PCN reaction causing severe rash involving mucus membranes or skin necrosis: No Has patient had a PCN reaction that required hospitalization: No Has patient had a PCN reaction occurring within the last 10 years: No If all of the above answers are "NO", then may proceed with Cephalosporin use.   . Brimonidine Other (See Comments)  . Other     Nuts causes blisters No spices coffee  . Penicillin G Other (See Comments)  . Timolol Maleate Other (See Comments)    Pt becomes faint  . Sulfa Antibiotics Rash  . Sulfasalazine Rash    Social History   Socioeconomic History  . Marital status: Widowed    Spouse name: Not on file  . Number of children: Not on file  . Years of education: Not on file  . Highest education level: Not on file  Occupational History  . Not on file  Tobacco Use  . Smoking status: Former  . Smokeless tobacco: Never Used  Substance and Sexual Activity  . Alcohol use: No  . Drug use: No  . Sexual activity: Not on file  Other Topics Concern  . Not on file  Social History Narrative  . Not on file   Social Determinants of Health   Financial Resource Strain:   . Difficulty of Paying Living Expenses: Not on file  Food Insecurity:   . Worried About Games developer  of Food in the Last Year: Not on file  . Ran Out of Food in the Last Year: Not on file  Transportation Needs:   . Lack of Transportation (Medical): Not on file  . Lack of Transportation (Non-Medical): Not on file  Physical Activity:   . Days of Exercise per Week: Not on file  . Minutes of Exercise per Session: Not on file  Stress:   . Feeling of Stress : Not on file  Social Connections:   . Frequency of Communication with Friends and Family: Not on file  . Frequency of Social Gatherings with Friends and Family: Not on file  . Attends Religious Services: Not on file  . Active Member of Clubs or Organizations: Not on file  . Attends Banker Meetings: Not on  file  . Marital Status: Not on file  Intimate Partner Violence:   . Fear of Current or Ex-Partner: Not on file  . Emotionally Abused: Not on file  . Physically Abused: Not on file  . Sexually Abused: Not on file    Family History  Problem Relation Age of Onset  . Heart attack Mother   . Stroke Father   . Anuerysm Father     ROS- All systems are reviewed and negative except as per the HPI above  Physical Exam: Telemetry:  SVT (likely atach) degenerated to afib and then converted to sinus rhythm with an initial post termination pause, then bradycardia, then sinus.  She has been in sinus rhythm since that time.  Vitals:   09/30/19 0312 09/30/19 0514 09/30/19 0515 09/30/19 0916  BP:  125/75  137/71  Pulse:   67   Resp:  18    Temp:   97.7 F (36.5 C)   TempSrc:   Oral   SpO2:   98%   Weight: 42.7 kg     Height:        GEN- The patient is thin and elderly appearing, alert and oriented x 3 today.   Head- normocephalic, atraumatic Eyes-  Sclera clear, conjunctiva pink Ears- hearing intact Oropharynx- clear Neck- supple  Lungs- , normal work of breathing Heart- Regular rate and rhythm  GI- soft  Extremities- no clubbing, cyanosis, or edema Skin- no rash or lesion Psych- euthymic mood, full affect Neuro- strength and sensation are intact  EKG from yesterday reveals atach vs atypical atrial flutter todays ekg reveals sinus rhythm  Labs:   Lab Results  Component Value Date   WBC 6.9 09/30/2019   HGB 14.0 09/30/2019   HCT 43.3 09/30/2019   MCV 101.2 (H) 09/30/2019   PLT 157 09/30/2019    Recent Labs  Lab 09/30/19 0424  NA 143  K 4.1  CL 110  CO2 22  BUN 19  CREATININE 1.01*  CALCIUM 9.0  GLUCOSE 84    Radiology:  CXR reviewed  Echo: from 2018 reviewed  ASSESSMENT AND PLAN:   1. SVT She has long RP SVT which is likely atach vs atypical atrial flutter Episodes are infrequent and benign.  We discussed at length today. Start diltiazem CD 120mg   daily Continue diltiazem 30mg  1-2 tabs Q4h prn in addition at home  2. afib Well controlled On eliquis 2.5mg  BID for chads2vasc score of 4  3. HTN Stable No change required today  OK to discharge to home I will arrange EP follow-up with Dr team in 1-2 weeks  Electrophysiology team to see as needed while here. Please call with questions.    Sophiya Morello, MD 09/30/2019  10:01 AM

## 2019-09-30 NOTE — Progress Notes (Signed)
Ambulated patient 100 feet in hallway using a walker. HR 71-101; asymptomatic. Back to baseline 70's at rest post ambulation. Mikal Plane, BSN, RN

## 2019-10-02 ENCOUNTER — Other Ambulatory Visit (HOSPITAL_COMMUNITY): Payer: Self-pay | Admitting: Cardiology

## 2019-10-02 NOTE — Telephone Encounter (Signed)
Prescription refill request for Eliquis received.  Last office visit: [place], 06/25/2019 Scr:  1.01, 09/30/2019 Age: 86 y.o. Weight: 42.7 kg   Prescription refill sent.

## 2019-10-04 NOTE — Discharge Summary (Signed)
Name: Jodi Snyder MRN: [id number] DOB: 03/01/1932 86 y.o. PCP: [name]., MD  Date of Admission: 09/29/2019  5:08 PM Date of Discharge: 09/30/2019 Attending Physician: Lalla Brothers T  Discharge Diagnosis: 1. Supraventricular Tachycardia   Discharge Medications: Allergies as of 09/30/2019      Reactions   Penicillins Hives, Swelling   Has patient had a PCN reaction causing immediate rash, facial/tongue/throat swelling, SOB or lightheadedness with hypotension: Yes Has patient had a PCN reaction causing severe rash involving mucus membranes or skin necrosis: No Has patient had a PCN reaction that required hospitalization: No Has patient had a PCN reaction occurring within the last 10 years: No If all of the above answers are "NO", then may proceed with Cephalosporin use.   Brimonidine Other (See Comments)   Other    Nuts causes blisters No spices coffee   Penicillin G Other (See Comments)   Timolol Maleate Other (See Comments)   Pt becomes faint   Sulfa Antibiotics Rash   Sulfasalazine Rash      Medication List    TAKE these medications   ALPRAZolam 0.5 MG tablet Commonly known as: XANAX Take 0.25 mg by mouth 2 (two) times daily. Supper time and bedtime   amLODipine 2.5 MG tablet Commonly known as: NORVASC Take 1 tablet (2.5 mg total) by mouth daily.   Biotin 5000 5 MG Caps Generic drug: Biotin Take 5 mg by mouth daily.   brinzolamide 1 % ophthalmic suspension Commonly known as: AZOPT Place 1 drop into both eyes 3 (three) times daily.   cycloSPORINE 0.05 % ophthalmic emulsion Commonly known as: RESTASIS Place 1 drop into both eyes 2 (two) times daily.   diltiazem 120 MG 24 hr capsule Commonly known as: CARDIZEM CD Take 1 capsule (120 mg total) by mouth daily.   diltiazem 30 MG tablet Commonly known as: Cardizem Take 1-2 tablets every 4 hours AS NEEDED for afib heart rate >100 What changed: additional instructions   esomeprazole 40 MG capsule  Commonly known as: NEXIUM Take 40 mg by mouth daily.   hydroxychloroquine 200 MG tablet Commonly known as: PLAQUENIL Take 200 mg by mouth daily.   ICAPS AREDS 2 PO Take 2 capsules by mouth 2 (two) times daily.   IRON PO Take 1 tablet by mouth daily.   ketorolac 0.4 % Soln Commonly known as: ACULAR Apply 1 drop to eye 2 (two) times daily.   lisinopril 10 MG tablet Commonly known as: ZESTRIL Take 1 tablet (10 mg total) by mouth daily.   metroNIDAZOLE 0.75 % cream Commonly known as: METROCREAM Apply 1 application topically daily.   predniSONE 5 MG tablet Commonly known as: DELTASONE Take 5 mg by mouth daily.   Rocklatan 0.02-0.005 % Soln Generic drug: Netarsudil-Latanoprost Apply 1 drop to eye at bedtime.   traMADol-acetaminophen 37.5-325 MG tablet Commonly known as: ULTRACET Take 1 tablet by mouth 2 (two) times daily.       Disposition and follow-up:   [name] was discharged from [facility] in Stable condition.  At the hospital follow up visit please address:  1.  Supraventricular Tachycardia       - Ask patient if she is experiencing any palpations       - Patient sent out on diltiazem 120 mg daily, and diltiazem 30 mg 1-2 tabs q4h prn       - Follow up with Dr.Camnitz's in 1-2 weeks  2.  Labs / imaging needed at time of follow-up: na  3.  Pending labs/ test needing follow-up: na  Follow-up Appointments: Follow-up Information    Elspeth Cho., MD. Schedule an appointment as soon as possible for a visit in 1 week(s).   Specialty: Internal Medicine Contact information: [address] [place] [address] [phone]        Regan Lemming, MD Follow up.   Specialty: Cardiology Why: They will contact you to set up appointment in 1-2 weeks Contact information: [address] [place] [address] [phone]           Hospital Course by problem list: 1.  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   #Supraventricular tachycardia Patient was given diltiazem infusion overnight and heart rate was 80 bpm morning exam. Evaluated by Dr. Johney Frame with cardiology. Converted to PO Diltiazem CD 120 mg daily, Continued diltiazem 30 mg 1 to 2 tablets every 4 hours as needed in addition at home  #Afib -Continued Eliquis 2.5 mg twice daily   #HTN -Continued home amlodipine   Discharge Vitals:   BP 125/73   Pulse 67   Temp 97.7 F (36.5 C) (Oral)   Resp 18   Ht 5\' 3"  (1.6 m)   Wt 42.7 kg   SpO2 98%   BMI 16.69 kg/m   Pertinent Labs, Studies, and Procedures:  CBC Latest Ref Rng & Units 09/30/2019 09/29/2019 08/19/2018  WBC 4.0 - 10.5 K/uL 6.9 6.0 5.9  Hemoglobin 12.0 - 15.0 g/dL 10/18/2018 41.3 24.4  Hematocrit 36.0 - 46.0 % 43.3 47.3(H) 44.6  Platelets 150 - 400 K/uL 157 154 217   BMP Latest Ref Rng & Units 09/30/2019 09/29/2019 08/21/2018  Glucose 70 - 99 mg/dL 84 99 83  BUN 8 - 23 mg/dL 19 22 22   Creatinine 0.44 - 1.00 mg/dL 10/20/2018) ) 2.72(Z)  Sodium 135 - 145 mmol/L 143 145 141  Potassium 3.5 - 5.1 mmol/L 4.1 3.4(L) 4.5  Chloride 98 - 111 mmol/L 110 108 114(H)  CO2 22 - 32 mmol/L 22 21(L) 22  Calcium 8.9 - 10.3 mg/dL 9.0 9.5 9.5   CLINICAL DATA:  Weakness, tachycardia, atrial fibrillation, shortness of breath.  EXAM: PORTABLE CHEST 1 VIEW  COMPARISON:  Chest x-ray dated 08/19/2018.  FINDINGS: Heart size and mediastinal contours are within normal limits. Aortic atherosclerosis. Lungs are hyperexpanded. Chronic scarring/fibrosis again noted at the lung apices. No confluent opacity to suggest a developing pneumonia. No pleural effusion or pneumothorax is seen. Osseous structures about the  chest are unremarkable.  IMPRESSION: 1. No acute findings. No evidence of pneumonia or pulmonary edema. 2. Hyperexpanded lungs indicating COPD. 3. Aortic atherosclerosis.  Discharge Instructions: Discharge Instructions    Call MD for:  difficulty breathing, headache or visual disturbances   Complete by: As directed    Call MD for:  extreme fatigue   Complete by: As directed    Call MD for:  hives   Complete by: As directed    Call MD for:  persistant dizziness or light-headedness   Complete by: As directed    Call MD for:  persistant nausea and vomiting   Complete by: As directed    Call MD for:  redness, tenderness, or signs of infection (pain, swelling, redness, odor or green/yellow discharge around incision site)  Complete by: As directed    Call MD for:  severe uncontrolled pain   Complete by: As directed    Call MD for:  temperature >100.4   Complete by: As directed    Diet - low sodium heart healthy   Complete by: As directed    Increase activity slowly   Complete by: As directed       Signed:  Thurmon Fair, MD PGY1

## 2019-10-15 ENCOUNTER — Telehealth: Payer: Self-pay | Admitting: Cardiology

## 2019-10-15 NOTE — Telephone Encounter (Signed)
Attempted to return call, unable to reach pt, phone kept clicking.   Attempted x 2.   Will attempt again tomorrow.

## 2019-10-15 NOTE — Telephone Encounter (Signed)
Pt c/o BP issue: STAT if pt c/o blurred vision, one-sided weakness or slurred speech  1. What are your last 5 BP readings?  03-01: 104/56 HR 55 02-28: 118/54 HR 65 02-27: 108/59 HR 57 02-26: 121/63 HR 55   2. Are you having any other symptoms (ex. Dizziness, headache, blurred vision, passed out)? Fatigue. Pt said she had a headache that lasted about ~15 min but it went away  3. What is your BP issue?  Pt has had some low BP and wanted to know what Dr. Elberta Fortis thinks. Her PA wants to adjust her BP medication, but since the PA is not a Contractor, she wants to know what her Heart doctors have to say

## 2019-10-16 NOTE — Telephone Encounter (Signed)
lmtcb

## 2019-10-17 ENCOUNTER — Telehealth: Payer: Self-pay

## 2019-10-17 NOTE — Telephone Encounter (Signed)
Attempted to call the patient back for the second time today-continuous clicking sound, unable to LM

## 2019-10-17 NOTE — Telephone Encounter (Signed)
Attempted to call the patient back, rang a few times and then begins clicking. Unable to leave a message. Will attempt to call again later.

## 2019-10-17 NOTE — Telephone Encounter (Signed)
Patient is returning call.  °

## 2019-10-18 NOTE — Telephone Encounter (Signed)
I spoke to the patient who is feeling better today and has reached out to her PCP at [facility] office about BP.    They told her that "things looked ok, but would reach out  and share with Dr Gershon Crane nurse."  I told her that a lot of times we defer BP advisement to the PCP and she was pleased to hear that.

## 2019-10-18 NOTE — Telephone Encounter (Addendum)
Spoke to [employer], Statistician,  from PCP office who calls in to make sure we are aware of recent issues w/ pt. She informs me that pt was hospitalized middle of last month for ATach.  They started her on daily Diltiazem and then increased it to 120 mg daily. Pt BP reading low 100s to 110s, HRs 50-60s.   She reports that pt is doing better and has been instructed to monitor BP/HR/symptoms further and if continued SBP low 100s and/or symptoms to call PCP/cardiologist to discuss. Advised nurse navigator that if continued low BPs then we would advise pt to stop Amlodipine (Dr. Elberta Fortis agreeable to this plan).

## 2019-10-25 ENCOUNTER — Other Ambulatory Visit: Payer: Self-pay | Admitting: Internal Medicine

## 2019-10-30 ENCOUNTER — Other Ambulatory Visit: Payer: Self-pay

## 2019-10-30 ENCOUNTER — Encounter: Payer: Self-pay | Admitting: Cardiology

## 2019-10-30 ENCOUNTER — Ambulatory Visit: Payer: Medicare PPO | Admitting: Cardiology

## 2019-10-30 VITALS — BP 154/72 | HR 65 | Ht 63.0 in | Wt 107.4 lb

## 2019-10-30 DIAGNOSIS — I480 Paroxysmal atrial fibrillation: Secondary | ICD-10-CM

## 2019-10-30 DIAGNOSIS — I10 Essential (primary) hypertension: Secondary | ICD-10-CM | POA: Diagnosis not present

## 2019-10-30 NOTE — Progress Notes (Signed)
Electrophysiology Office Note   Date:  10/30/2019   ID:  Jodi Snyder, DOB Nov 19, 1931, MRN [id number]  PCP:  Jodi Snyder., MD  Cardiologist:  none Primary Electrophysiologist:  Jodi Chavira Meredith Leeds, MD    No chief complaint on file.    History of Present Illness: Jodi Snyder is a 86 y.o. female who is being seen today for the evaluation of atrial fibrillation at the request of Jodi Snyder., MD. Presenting today for electrophysiology evaluation. History of HTN and polymyalgia. She presented to the ER on 6/2 with palpitations, lightheadedness, SOB. Found to be in AF with RVR with rates of 160 bpm. Successfully cardioverted. Recently had prednisone increased for polymyalgia. Was placed on Eliquis. She has diltiazem as needed for AF but is not on chronic medications.  Today, denies symptoms of palpitations, chest pain, shortness of breath, orthopnea, PND, lower extremity edema, claudication, dizziness, presyncope, syncope, bleeding, or neurologic sequela. The patient is tolerating medications without difficulties.  Presented emergency room.  She was found to have high blood pressure as well as a long RP tachycardia.  She was put on diltiazem and sent home.  She brings in blood pressure recordings today that show overall well controlled blood pressures.  She is not had any further tachycardia.   Past Medical History:  Diagnosis Date  . Atrial fibrillation ([place])   . Hypertension   . Immune system disorder ([place])   . Polymyalgia ([place])    Past Surgical History:  Procedure Laterality Date  . ABDOMINAL HYSTERECTOMY    . BLADDER SURGERY    . OOPHORECTOMY       Current Outpatient Medications  Medication Sig Dispense Refill  . ALPRAZolam (XANAX) 0.5 MG tablet Take 0.25 mg by mouth 2 (two) times daily. Supper time and bedtime    . amLODipine (NORVASC) 2.5 MG tablet Take 1 tablet (2.5 mg total) by mouth daily. 90 tablet 1  . Biotin (BIOTIN 5000) 5 MG CAPS Take 5 mg by mouth  daily.    . brinzolamide (AZOPT) 1 % ophthalmic suspension Place 1 drop into both eyes 3 (three) times daily.     . cycloSPORINE (RESTASIS) 0.05 % ophthalmic emulsion Place 1 drop into both eyes 2 (two) times daily.     Marland Kitchen diltiazem (CARDIZEM CD) 120 MG 24 hr capsule Take 1 capsule (120 mg total) by mouth daily. 30 capsule 0  . diltiazem (CARDIZEM) 30 MG tablet Take 1-2 tablets every 4 hours AS NEEDED for afib heart rate >100 45 tablet 1  . ELIQUIS 2.5 MG TABS tablet TAKE 1 TABLET BY MOUTH TWICE DAILY 60 tablet 5  . esomeprazole (NEXIUM) 40 MG capsule Take 40 mg by mouth daily.     . Ferrous Sulfate (IRON PO) Take 1 tablet by mouth daily.    . hydroxychloroquine (PLAQUENIL) 200 MG tablet Take 200 mg by mouth daily.    Marland Kitchen ketorolac (ACULAR) 0.4 % SOLN Apply 1 drop to eye 2 (two) times daily.    Marland Kitchen lisinopril (ZESTRIL) 10 MG tablet Take 1 tablet (10 mg total) by mouth daily. 90 tablet 1  . metroNIDAZOLE (METROCREAM) 0.75 % cream Apply 1 application topically daily.    . Multiple Vitamins-Minerals (ICAPS AREDS 2 PO) Take 2 capsules by mouth 2 (two) times daily.    . predniSONE (DELTASONE) 5 MG tablet Take 5 mg by mouth daily.     Marland Kitchen ROCKLATAN 0.02-0.005 % SOLN Apply 1 drop to eye at bedtime.    . traMADol-acetaminophen (ULTRACET) 37.5-325  MG tablet Take 1 tablet by mouth 2 (two) times daily.      No current facility-administered medications for this visit.    Allergies:   Penicillins, Brimonidine, Other, Penicillin g, Timolol maleate, Sulfa antibiotics, and Sulfasalazine   Social History:  The patient  reports that she has quit smoking. She has never used smokeless tobacco. She reports that she does not drink alcohol or use drugs.   Family History:  The patient's family history includes Anuerysm in her father; Heart attack in her mother; Stroke in her father.   ROS:  Please see the history of present illness.   Otherwise, review of systems is positive for none.   All other systems are reviewed and  negative.   PHYSICAL EXAM: VS:  There were no vitals taken for this visit. , BMI There is no height or weight on file to calculate BMI. GEN: Well nourished, well developed, in no acute distress  HEENT: normal  Neck: no JVD, carotid bruits, or masses Cardiac: RRR; no murmurs, rubs, or gallops,no edema  Respiratory:  clear to auscultation bilaterally, normal work of breathing GI: soft, nontender, nondistended, + BS MS: no deformity or atrophy  Skin: warm and dry Neuro:  Strength and sensation are intact Psych: euthymic mood, full affect  EKG:  EKG is ordered today. Personal review of the ekg ordered shows sinus rhythm, rate 65  Recent Labs: 09/29/2019: Magnesium 1.9; TSH 1.643 09/30/2019: BUN 19; Creatinine, Ser 1.01; Hemoglobin 14.0; Platelets 157; Potassium 4.1; Sodium 143    Lipid Panel  No results found for: CHOL, TRIG, HDL, CHOLHDL, VLDL, LDLCALC, LDLDIRECT   Wt Readings from Last 3 Encounters:  09/30/19 94 lb 3.2 oz (42.7 kg)  08/15/19 100 lb 11.2 oz (45.7 kg)  06/25/19 100 lb (45.4 kg)      Other studies Reviewed: Additional studies/ records that were reviewed today include: TTE 02/09/17  Review of the above records today demonstrates:  - Left ventricle: The cavity size was normal. Wall thickness was   normal. Systolic function was normal. The estimated ejection   fraction was in the range of 60% to 65%. Wall motion was normal;   there were no regional wall motion abnormalities. Features are   consistent with a pseudonormal left ventricular filling pattern,   with concomitant abnormal relaxation and increased filling   pressure (grade 2 diastolic dysfunction). - Aortic valve: Mildly calcified annulus. - Mitral valve: There was mild regurgitation. - Pulmonary arteries: Systolic pressure was mildly increased. PA   peak pressure: 32 mm Hg (S).   ASSESSMENT AND PLAN:  1.  Paroxysmal atrial fibrillation: Eliquis and as needed diltiazem.  CHA2DS2-VASc of 4.   Fortunately she is remained in sinus rhythm.  No changes.   2. Hypertension: Elevated today but she brings in numbers to clinic which show that she is usually well controlled.  No changes.  3.  Long RP tachycardia: Found on ECG during her emergency room visit.  She has retrograde P waves.  She was put on 120 mg of diltiazem and has not had further symptoms.  Current medicines are reviewed at length with the patient today.   The patient does not have concerns regarding her medicines.  The following changes were made today: None  Labs/ tests ordered today include:  No orders of the defined types were placed in this encounter.    Disposition:   FU with Sujay Grundman 6 months  Signed, Camila Norville Jorja Loa, MD  10/30/2019 3:25 PM  [address] [place] [address] [phone] (office) [phone] (fax)

## 2019-10-30 NOTE — Patient Instructions (Signed)
Medication Instructions:  Your physician has recommended you make the following change in your medication:  1. STOP Amlodipine (Norvasc)  *If you need a refill on your cardiac medications before your next appointment, please call your pharmacy*   Lab Work: None ordered  Testing/Procedures: None ordered   Follow-Up: At [facility], you and your health needs are our priority.  As part of our continuing mission to provide you with exceptional heart care, we have created designated Provider Care Teams.  These Care Teams include your primary Cardiologist (physician) and Advanced Practice Providers (APPs -  Physician Assistants and Nurse Practitioners) who all work together to provide you with the care you need, when you need it.  We recommend signing up for the patient portal called "MyChart".  Sign up information is provided on this After Visit Summary.  MyChart is used to connect with patients for Virtual Visits (Telemedicine).  Patients are able to view lab/test results, encounter notes, upcoming appointments, etc.  Non-urgent messages can be sent to your provider as well.   To learn more about what you can do with MyChart, go to [email or web address].    Your next appointment:   1 year(s)  The format for your next appointment:   In Person  Provider:   Loman Brooklyn, MD   Thank you for choosing [facility]!!   Dory Horn, RN [phone]    Other Instructions

## 2019-10-31 ENCOUNTER — Other Ambulatory Visit: Payer: Self-pay

## 2019-10-31 MED ORDER — DILTIAZEM HCL ER COATED BEADS 120 MG PO CP24: 120.0000 mg | ORAL_CAPSULE | Freq: Every day | ORAL | 3 refills | Status: DC

## 2019-11-10 ENCOUNTER — Emergency Department (HOSPITAL_BASED_OUTPATIENT_CLINIC_OR_DEPARTMENT_OTHER)
Admission: EM | Admit: 2019-11-10 | Discharge: 2019-11-10 | Disposition: A | Discharge status: Home / Self Care | Payer: Medicare PPO | Attending: Emergency Medicine | Admitting: Emergency Medicine

## 2019-11-10 ENCOUNTER — Emergency Department (HOSPITAL_BASED_OUTPATIENT_CLINIC_OR_DEPARTMENT_OTHER): Payer: Medicare PPO

## 2019-11-10 ENCOUNTER — Telehealth: Payer: Self-pay | Admitting: Medical

## 2019-11-10 ENCOUNTER — Emergency Department (HOSPITAL_COMMUNITY): Payer: Medicare PPO

## 2019-11-10 ENCOUNTER — Other Ambulatory Visit: Payer: Self-pay

## 2019-11-10 ENCOUNTER — Encounter (HOSPITAL_BASED_OUTPATIENT_CLINIC_OR_DEPARTMENT_OTHER): Payer: Self-pay | Admitting: Emergency Medicine

## 2019-11-10 DIAGNOSIS — I129 Hypertensive chronic kidney disease with stage 1 through stage 4 chronic kidney disease, or unspecified chronic kidney disease: Secondary | ICD-10-CM | POA: Diagnosis not present

## 2019-11-10 DIAGNOSIS — S0990XA Unspecified injury of head, initial encounter: Secondary | ICD-10-CM | POA: Diagnosis present

## 2019-11-10 DIAGNOSIS — N183 Chronic kidney disease, stage 3 unspecified: Secondary | ICD-10-CM | POA: Insufficient documentation

## 2019-11-10 DIAGNOSIS — Z7901 Long term (current) use of anticoagulants: Secondary | ICD-10-CM | POA: Diagnosis not present

## 2019-11-10 DIAGNOSIS — W208XXA Other cause of strike by thrown, projected or falling object, initial encounter: Secondary | ICD-10-CM | POA: Diagnosis not present

## 2019-11-10 DIAGNOSIS — Y999 Unspecified external cause status: Secondary | ICD-10-CM | POA: Diagnosis not present

## 2019-11-10 DIAGNOSIS — Z87891 Personal history of nicotine dependence: Secondary | ICD-10-CM | POA: Diagnosis not present

## 2019-11-10 DIAGNOSIS — Y9389 Activity, other specified: Secondary | ICD-10-CM | POA: Insufficient documentation

## 2019-11-10 DIAGNOSIS — Z79899 Other long term (current) drug therapy: Secondary | ICD-10-CM | POA: Insufficient documentation

## 2019-11-10 DIAGNOSIS — Y929 Unspecified place or not applicable: Secondary | ICD-10-CM | POA: Diagnosis not present

## 2019-11-10 NOTE — Discharge Instructions (Signed)
Hold your next 2 doses of Eliquis and start taking again tomorrow night.  Return to emergency room if you have any worsening symptoms such as increased headaches, nausea or vomiting, difficulty with your balance, confusion or other worsening symptoms.

## 2019-11-10 NOTE — ED Triage Notes (Addendum)
Pt states that she was reaching up on a shelf and hit her right forehead. She is on blood thinners  - denies any LOC

## 2019-11-10 NOTE — Telephone Encounter (Signed)
   Patient called the after hours line to report a head injury. She was attempting to get something off a shelf a couple feet above her head when a bag fell that contained a heavy wooden box, striking her in the head. She reported a lump at the site. She did not lose consciousness. She had no complaints of dizziness, lightheadedness, headache, appreciable vision changes (poor vision at baseline), or weakness. She is on eliquis for atrial fibrillation. I recommended she present to the ED for a CT of her head to r/o a bleed. She lives close to [facility]. She is in agreement with the plan.   Beatriz Stallion, PA-C 11/10/19; 4:17 PM

## 2019-11-10 NOTE — ED Provider Notes (Signed)
[facility] EMERGENCY DEPARTMENT Provider Note   CSN: [id number] Arrival date & time: 11/10/19  1714     History Chief Complaint  Patient presents with  . Head Injury    Jodi Snyder is a 86 y.o. female.  Patient is a 86 year old female who presents after head injury.  She is on Eliquis for atrial fibrillation.  She was trying to get some boxes off a shelf and a heavy box fell onto the right side of her head.  She had no loss of consciousness.  No nausea or vomiting.  This happened about an hour or so prior to arrival.  She denies any neck or back pain.  She did not fall and hit her head.  The box just fell on her head.  She denies any other injuries from the incident.  She last took her Eliquis at 9:00 this morning.  She has mild tenderness over the right side of her head where the box hit but no other headaches.  No dizziness or trouble with her balance.  No numbness or weakness to her extremities.        Past Medical History:  Diagnosis Date  . Atrial fibrillation ([place])   . Hypertension   . Immune system disorder ([place])   . Polymyalgia [facility])     Patient Active Problem List   Diagnosis Date Noted  . Supraventricular tachycardia ([place]) 09/29/2019  . Hypokalemia   . 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[place]) 08/19/2018  . Asymptomatic microscopic hematuria 01/05/2018  . Paroxysmal atrial fibrillation ([place]) 01/05/2018  . Scoliosis 10/18/2017  . Thoracogenic scoliosis of thoracolumbar region 10/08/2017  . Glaucoma 06/21/2016  . Drug therapy 01/28/2016  . Benign hypertension with CKD (chronic kidney disease) stage III 01/16/2016  . Discoid lupus erythematosus 01/16/2016  . Generalized anxiety disorder 01/16/2016  . Osteoporosis 01/16/2016  . Polymyalgia rheumatica ([place]) 01/16/2016  . Closed fracture of distal end of left radius 11/11/2014  . Dry eye syndrome 08/03/2012  . Other states following surgery of eye and adnexa 04/21/2012  . 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 Low-tension glaucoma, unspecified eye, stage unspecified 04/07/2012  . Pseudophakia of both eyes 04/07/2012    Past Surgical History:  Procedure Laterality Date  . ABDOMINAL HYSTERECTOMY    . BLADDER SURGERY    . OOPHORECTOMY       OB History   No obstetric history on file.     Family History  Problem Relation Age of Onset  . Heart attack Mother   . Stroke Father   . Anuerysm Father     Social History   Tobacco Use  . Smoking status: Former Research scientist (life sciences)  . Smokeless tobacco: Never Used  Substance Use Topics  . Alcohol use: No  . Drug use: No    Home Medications Prior to Admission medications   Medication Sig Start Date End Date Taking? Authorizing Provider  ALPRAZolam Duanne Moron) 0.5 MG tablet Take 0.25 mg by mouth 2 (two) times daily. Supper time and bedtime 04/20/18   [provider]  Biotin (BIOTIN 5000) 5 MG CAPS Take 5 mg by mouth daily.    [provider]  brinzolamide (AZOPT) 1 % ophthalmic suspension Place 1 drop into both eyes 3 (three) times daily.     [provider]  cycloSPORINE (RESTASIS) 0.05 % ophthalmic emulsion Place 1 drop into both eyes 2 (two) times daily.     [provider]  diltiazem (CARDIZEM CD) 120 MG 24 hr capsule Take 1 capsule (120 mg  total) by mouth daily. 10/31/19   Camnitz, Andree Coss, MD  diltiazem (CARDIZEM) 30 MG tablet Take 1-2 tablets every 4 hours AS NEEDED for afib heart rate >100 09/30/19   Claudean Severance, MD  ELIQUIS 2.5 MG TABS tablet TAKE 1 TABLET BY MOUTH TWICE DAILY 10/02/19   Camnitz, Andree Coss, MD  esomeprazole (NEXIUM) 40 MG capsule Take 40 mg by mouth daily.     [provider]  ferrous sulfate 325 (65 FE) MG tablet Take 325 mg by mouth daily.    [provider]  hydroxychloroquine (PLAQUENIL) 200 MG tablet Take 200 mg by mouth daily.    [provider]  ketorolac (ACULAR) 0.4 % SOLN Apply 1 drop to eye 2 (two) times daily. 09/04/19   [provider]  lisinopril (ZESTRIL) 10 MG tablet Take 1 tablet (10 mg total) by mouth daily. 03/20/19   Camnitz, Will Daphine Deutscher, MD  metroNIDAZOLE (METROCREAM) 0.75 % cream Apply 1 application topically daily. 08/08/19   [provider]  Multiple Vitamins-Minerals (ICAPS AREDS 2 PO) Take 2 capsules by mouth 2 (two) times daily.    [provider]  predniSONE (DELTASONE) 5 MG tablet Take 5 mg by mouth daily.  01/13/18   [provider]  ROCKLATAN 0.02-0.005 % SOLN Apply 1 drop to eye at bedtime. 08/31/19   [provider]  traMADol-acetaminophen (ULTRACET) 37.5-325 MG tablet Take 1 tablet by mouth 2 (two) times daily.     [provider]    Allergies    Penicillins, Brimonidine, Other, Penicillin g, Timolol maleate, Sulfa antibiotics, and Sulfasalazine  Review of Systems   Review of Systems  Constitutional: Negative for chills, diaphoresis, fatigue and fever.  HENT: Negative for congestion, rhinorrhea and sneezing.   Eyes: Negative.   Respiratory: Negative for cough, chest tightness and shortness of breath.   Cardiovascular: Negative for chest pain and leg swelling.  Gastrointestinal: Negative for abdominal pain, blood in stool, diarrhea, nausea and vomiting.  Genitourinary: Negative for difficulty urinating, flank pain, frequency and hematuria.  Musculoskeletal: Negative for arthralgias and back pain.  Skin: Negative for rash.  Neurological: Positive for headaches. Negative for dizziness, speech difficulty, weakness and numbness.    Physical Exam Updated Vital Signs BP (!) 149/72 (BP Location: Right Arm)   Pulse 60   Temp 98 F (36.7 C) (Oral)   Resp 16   Ht 5\' 3"  (1.6 m)   Wt 48.7 kg   SpO2 100%   BMI 19.02 kg/m   Physical Exam Constitutional:      Appearance: She is well-developed.  HENT:     Head: Normocephalic.     Comments: Small area of ecchymosis to the right parietal area with a small overlying abrasion Eyes:     Pupils:  Pupils are equal, round, and reactive to light.  Cardiovascular:     Rate and Rhythm: Normal rate and regular rhythm.     Heart sounds: Normal heart sounds.  Pulmonary:     Effort: Pulmonary effort is normal. No respiratory distress.     Breath sounds: Normal breath sounds. No wheezing or rales.  Chest:     Chest wall: No tenderness.  Abdominal:     General: Bowel sounds are normal.     Palpations: Abdomen is soft.     Tenderness: There is no abdominal tenderness. There is no guarding or rebound.  Musculoskeletal:        General: Normal range of motion.     Cervical back: Normal  range of motion and neck supple.  Lymphadenopathy:     Cervical: No cervical adenopathy.  Skin:    General: Skin is warm and dry.     Findings: No rash.  Neurological:     Mental Status: She is alert and oriented to person, place, and time.     Comments: Motor 5/5 all extremities Sensation grossly intact to LT all extremities Finger to Nose intact, no pronator drift CN II-XII grossly intact       ED Results / Procedures / Treatments   Labs (all labs ordered are listed, but only abnormal results are displayed) Labs Reviewed - No data to display  EKG None  Radiology CT Head Wo Contrast  Result Date: 11/10/2019 CLINICAL DATA:  Trauma to top of head, right frontotemporal bruising, anticoagulated EXAM: CT HEAD WITHOUT CONTRAST TECHNIQUE: Contiguous axial images were obtained from the base of the skull through the vertex without intravenous contrast. COMPARISON:  08/01/2018 FINDINGS: Brain: No acute infarct or hemorrhage. Lateral ventricles and midline structures are stable. Chronic hypodensities in the periventricular white matter most prominent in the left parietooccipital region. No acute extra-axial fluid collections. No mass effect. Vascular: No hyperdense vessel or unexpected calcification. Skull: Normal. Negative for fracture or focal lesion. Sinuses/Orbits: No acute finding. Other: None  IMPRESSION: 1. Stable exam, no acute process. Electronically Signed   By: Sharlet Salina M.D.   On: 11/10/2019 18:27    Procedures Procedures (including critical care time)  Medications Ordered in ED Medications - No data to display  ED Course  I have reviewed the triage vital signs and the nursing notes.  Pertinent labs & imaging results that were available during my care of the patient were reviewed by me and considered in my medical decision making (see chart for details).    MDM Rules/Calculators/A&P                      Patient presents after a minor head injury while on Eliquis.  She does not have any other apparent injuries.  Head CT shows no acute abnormalities.  She has no expanding hematoma to her head.  She is neurologically intact.  She is currently asymptomatic.  She was discharged home in good condition.  She was advised to hold her next 2 doses of Eliquis and started back tomorrow night.  Return precautions and head injury precautions were given. Final Clinical Impression(s) / ED Diagnoses Final diagnoses:  Minor head injury, initial encounter    Rx / DC Orders ED Discharge Orders    None       Rolan Bucco, MD 11/10/19 1909

## 2019-11-13 ENCOUNTER — Telehealth: Payer: Self-pay | Admitting: Cardiology

## 2019-11-13 NOTE — Telephone Encounter (Signed)
New message:    Patient calling she accidentally took her Eliquis twice. Patient needs to speak with some one.

## 2019-11-13 NOTE — Telephone Encounter (Signed)
Spoke with patient. Advised there is no need to be alarmed. Educated to watch out for s/sx of bleeding, but I do not expect any. Patient is on lower dose 2.5mg .  Discussed using a pill box which patient states she does. She states she has workers at her house and she just had a lot going on forgot she took it. Patient appreciative of the call.

## 2019-12-10 ENCOUNTER — Other Ambulatory Visit: Payer: Self-pay | Admitting: Cardiology

## 2020-01-09 ENCOUNTER — Encounter (HOSPITAL_BASED_OUTPATIENT_CLINIC_OR_DEPARTMENT_OTHER): Payer: Self-pay | Admitting: Emergency Medicine

## 2020-01-09 ENCOUNTER — Observation Stay (HOSPITAL_COMMUNITY): Payer: Medicare PPO

## 2020-01-09 ENCOUNTER — Other Ambulatory Visit: Payer: Self-pay

## 2020-01-09 ENCOUNTER — Emergency Department (HOSPITAL_BASED_OUTPATIENT_CLINIC_OR_DEPARTMENT_OTHER): Payer: Medicare PPO

## 2020-01-09 ENCOUNTER — Inpatient Hospital Stay (HOSPITAL_BASED_OUTPATIENT_CLINIC_OR_DEPARTMENT_OTHER)
Admission: EM | Admit: 2020-01-09 | Discharge: 2020-01-14 | DRG: 313 | Disposition: A | Discharge status: Home Health | Payer: Medicare PPO | Attending: Student | Admitting: Student

## 2020-01-09 DIAGNOSIS — Z880 Allergy status to penicillin: Secondary | ICD-10-CM

## 2020-01-09 DIAGNOSIS — Z681 Body mass index (BMI) 19 or less, adult: Secondary | ICD-10-CM

## 2020-01-09 DIAGNOSIS — R0789 Other chest pain: Secondary | ICD-10-CM | POA: Diagnosis not present

## 2020-01-09 DIAGNOSIS — H3530 Unspecified macular degeneration: Secondary | ICD-10-CM | POA: Diagnosis present

## 2020-01-09 DIAGNOSIS — M353 Polymyalgia rheumatica: Secondary | ICD-10-CM | POA: Diagnosis present

## 2020-01-09 DIAGNOSIS — I480 Paroxysmal atrial fibrillation: Secondary | ICD-10-CM | POA: Diagnosis present

## 2020-01-09 DIAGNOSIS — R52 Pain, unspecified: Secondary | ICD-10-CM

## 2020-01-09 DIAGNOSIS — G4700 Insomnia, unspecified: Secondary | ICD-10-CM | POA: Diagnosis present

## 2020-01-09 DIAGNOSIS — Z87891 Personal history of nicotine dependence: Secondary | ICD-10-CM

## 2020-01-09 DIAGNOSIS — M810 Age-related osteoporosis without current pathological fracture: Secondary | ICD-10-CM | POA: Diagnosis present

## 2020-01-09 DIAGNOSIS — N1831 Chronic kidney disease, stage 3a: Secondary | ICD-10-CM | POA: Diagnosis present

## 2020-01-09 DIAGNOSIS — Z888 Allergy status to other drugs, medicaments and biological substances status: Secondary | ICD-10-CM

## 2020-01-09 DIAGNOSIS — Z882 Allergy status to sulfonamides status: Secondary | ICD-10-CM

## 2020-01-09 DIAGNOSIS — Z91018 Allergy to other foods: Secondary | ICD-10-CM

## 2020-01-09 DIAGNOSIS — N179 Acute kidney failure, unspecified: Secondary | ICD-10-CM | POA: Diagnosis present

## 2020-01-09 DIAGNOSIS — H401290 Low-tension glaucoma, unspecified eye, stage unspecified: Secondary | ICD-10-CM | POA: Diagnosis present

## 2020-01-09 DIAGNOSIS — G8929 Other chronic pain: Secondary | ICD-10-CM | POA: Diagnosis present

## 2020-01-09 DIAGNOSIS — M4135 Thoracogenic scoliosis, thoracolumbar region: Secondary | ICD-10-CM | POA: Diagnosis present

## 2020-01-09 DIAGNOSIS — Z8249 Family history of ischemic heart disease and other diseases of the circulatory system: Secondary | ICD-10-CM

## 2020-01-09 DIAGNOSIS — Z7901 Long term (current) use of anticoagulants: Secondary | ICD-10-CM

## 2020-01-09 DIAGNOSIS — M546 Pain in thoracic spine: Secondary | ICD-10-CM | POA: Diagnosis not present

## 2020-01-09 DIAGNOSIS — Z7952 Long term (current) use of systemic steroids: Secondary | ICD-10-CM

## 2020-01-09 DIAGNOSIS — I130 Hypertensive heart and chronic kidney disease with heart failure and stage 1 through stage 4 chronic kidney disease, or unspecified chronic kidney disease: Secondary | ICD-10-CM | POA: Diagnosis present

## 2020-01-09 DIAGNOSIS — Z66 Do not resuscitate: Secondary | ICD-10-CM | POA: Diagnosis present

## 2020-01-09 DIAGNOSIS — I5032 Chronic diastolic (congestive) heart failure: Secondary | ICD-10-CM | POA: Diagnosis present

## 2020-01-09 DIAGNOSIS — R079 Chest pain, unspecified: Secondary | ICD-10-CM | POA: Diagnosis not present

## 2020-01-09 DIAGNOSIS — R54 Age-related physical debility: Secondary | ICD-10-CM | POA: Diagnosis present

## 2020-01-09 DIAGNOSIS — Z20822 Contact with and (suspected) exposure to covid-19: Secondary | ICD-10-CM | POA: Diagnosis present

## 2020-01-09 DIAGNOSIS — E43 Unspecified severe protein-calorie malnutrition: Secondary | ICD-10-CM | POA: Diagnosis present

## 2020-01-09 DIAGNOSIS — R627 Adult failure to thrive: Secondary | ICD-10-CM | POA: Diagnosis present

## 2020-01-09 DIAGNOSIS — F419 Anxiety disorder, unspecified: Secondary | ICD-10-CM | POA: Diagnosis present

## 2020-01-09 DIAGNOSIS — R531 Weakness: Secondary | ICD-10-CM

## 2020-01-09 DIAGNOSIS — L930 Discoid lupus erythematosus: Secondary | ICD-10-CM | POA: Diagnosis present

## 2020-01-09 DIAGNOSIS — Z79899 Other long term (current) drug therapy: Secondary | ICD-10-CM

## 2020-01-09 LAB — CBC WITH DIFFERENTIAL/PLATELET
Abs Immature Granulocytes: 0.01 10*3/uL (ref 0.00–0.07)
Basophils Absolute: 0 10*3/uL (ref 0.0–0.1)
Basophils Relative: 1 %
Eosinophils Absolute: 0 10*3/uL (ref 0.0–0.5)
Eosinophils Relative: 1 %
HCT: 44.7 % (ref 36.0–46.0)
Hemoglobin: 14.4 g/dL (ref 12.0–15.0)
Immature Granulocytes: 0 %
Lymphocytes Relative: 11 %
Lymphs Abs: 0.7 10*3/uL (ref 0.7–4.0)
MCH: 32.7 pg (ref 26.0–34.0)
MCHC: 32.2 g/dL (ref 30.0–36.0)
MCV: 101.6 fL — ABNORMAL HIGH (ref 80.0–100.0)
Monocytes Absolute: 0.2 10*3/uL (ref 0.1–1.0)
Monocytes Relative: 4 %
Neutro Abs: 5.2 10*3/uL (ref 1.7–7.7)
Neutrophils Relative %: 83 %
Platelets: 151 10*3/uL (ref 150–400)
RBC: 4.4 MIL/uL (ref 3.87–5.11)
RDW: 13.2 % (ref 11.5–15.5)
WBC: 6.2 10*3/uL (ref 4.0–10.5)
nRBC: 0 % (ref 0.0–0.2)

## 2020-01-09 LAB — BASIC METABOLIC PANEL
Anion gap: 12 (ref 5–15)
BUN: 30 mg/dL — ABNORMAL HIGH (ref 8–23)
CO2: 26 mmol/L (ref 22–32)
Calcium: 9.2 mg/dL (ref 8.9–10.3)
Chloride: 102 mmol/L (ref 98–111)
Creatinine, Ser: 1.4 mg/dL — ABNORMAL HIGH (ref 0.44–1.00)
GFR calc Af Amer: 40 mL/min — ABNORMAL LOW (ref >60)
GFR calc non Af Amer: 34 mL/min — ABNORMAL LOW (ref >60)
Glucose, Bld: 99 mg/dL (ref 70–99)
Potassium: 4.6 mmol/L (ref 3.5–5.1)
Sodium: 140 mmol/L (ref 135–145)

## 2020-01-09 LAB — TROPONIN I (HIGH SENSITIVITY)
Troponin I (High Sensitivity): 10 ng/L (ref <18)
Troponin I (High Sensitivity): 10 ng/L (ref <18)

## 2020-01-09 LAB — D-DIMER, QUANTITATIVE: D-Dimer, Quant: <0.27 ug/mL-FEU (ref 0.00–0.50)

## 2020-01-09 LAB — SARS CORONAVIRUS 2 BY RT PCR (HOSPITAL ORDER, PERFORMED IN ~~LOC~~ HOSPITAL LAB): SARS Coronavirus 2: NEGATIVE

## 2020-01-09 MED ORDER — TRAMADOL-ACETAMINOPHEN 37.5-325 MG PO TABS
1.0000 | ORAL_TABLET | Freq: Two times a day (BID) | ORAL | Status: DC
  Administered 2020-01-09 – 2020-01-14 (×9): 1 via ORAL
  Filled 2020-01-09 (×9): qty 1

## 2020-01-09 MED ORDER — ONDANSETRON HCL 4 MG/2ML IJ SOLN: 4.0000 mg | Freq: Four times a day (QID) | INTRAMUSCULAR | Status: DC | PRN

## 2020-01-09 MED ORDER — CYCLOSPORINE 0.05 % OP EMUL
1.0000 [drp] | Freq: Two times a day (BID) | OPHTHALMIC | Status: DC
  Administered 2020-01-09 – 2020-01-14 (×10): 1 [drp] via OPHTHALMIC
  Filled 2020-01-09 (×12): qty 1

## 2020-01-09 MED ORDER — LISINOPRIL 10 MG PO TABS
10.0000 mg | ORAL_TABLET | Freq: Every day | ORAL | Status: DC
  Administered 2020-01-10 – 2020-01-13 (×4): 10 mg via ORAL
  Filled 2020-01-09 (×4): qty 1

## 2020-01-09 MED ORDER — METRONIDAZOLE 0.75 % EX CREA: 1.0000 "application " | TOPICAL_CREAM | Freq: Every day | CUTANEOUS | Status: DC

## 2020-01-09 MED ORDER — BRINZOLAMIDE 1 % OP SUSP
1.0000 [drp] | Freq: Three times a day (TID) | OPHTHALMIC | Status: DC
  Administered 2020-01-09 – 2020-01-14 (×14): 1 [drp] via OPHTHALMIC
  Filled 2020-01-09: qty 10

## 2020-01-09 MED ORDER — ASPIRIN 81 MG PO CHEW
324.0000 mg | CHEWABLE_TABLET | Freq: Once | ORAL | Status: DC
  Filled 2020-01-09: qty 4

## 2020-01-09 MED ORDER — ALPRAZOLAM 0.25 MG PO TABS
0.2500 mg | ORAL_TABLET | Freq: Two times a day (BID) | ORAL | Status: DC
  Administered 2020-01-09 – 2020-01-14 (×10): 0.25 mg via ORAL
  Filled 2020-01-09 (×10): qty 1

## 2020-01-09 MED ORDER — FERROUS SULFATE 325 (65 FE) MG PO TABS
325.0000 mg | ORAL_TABLET | Freq: Every day | ORAL | Status: DC
  Administered 2020-01-10 – 2020-01-14 (×5): 325 mg via ORAL
  Filled 2020-01-09 (×5): qty 1

## 2020-01-09 MED ORDER — ACETAMINOPHEN 325 MG PO TABS
650.0000 mg | ORAL_TABLET | ORAL | Status: DC | PRN
  Administered 2020-01-10: 650 mg via ORAL
  Filled 2020-01-09 (×2): qty 2

## 2020-01-09 MED ORDER — SODIUM CHLORIDE 0.9 % IV BOLUS
500.0000 mL | Freq: Once | INTRAVENOUS | Status: AC
  Administered 2020-01-09: 500 mL via INTRAVENOUS

## 2020-01-09 MED ORDER — NETARSUDIL-LATANOPROST 0.02-0.005 % OP SOLN
1.0000 [drp] | Freq: Every day | OPHTHALMIC | Status: DC
  Administered 2020-01-09 – 2020-01-13 (×5): 1 [drp] via OPHTHALMIC

## 2020-01-09 MED ORDER — METRONIDAZOLE 0.75 % EX GEL
Freq: Every day | CUTANEOUS | Status: DC
  Filled 2020-01-09: qty 45

## 2020-01-09 MED ORDER — HYDROXYCHLOROQUINE SULFATE 200 MG PO TABS
200.0000 mg | ORAL_TABLET | Freq: Every day | ORAL | Status: DC
  Administered 2020-01-10 – 2020-01-14 (×5): 200 mg via ORAL
  Filled 2020-01-09 (×5): qty 1

## 2020-01-09 MED ORDER — DILTIAZEM HCL ER COATED BEADS 120 MG PO CP24
120.0000 mg | ORAL_CAPSULE | Freq: Every day | ORAL | Status: DC
  Administered 2020-01-10 – 2020-01-14 (×5): 120 mg via ORAL
  Filled 2020-01-09 (×5): qty 1

## 2020-01-09 MED ORDER — KETOROLAC TROMETHAMINE 0.5 % OP SOLN
1.0000 [drp] | Freq: Two times a day (BID) | OPHTHALMIC | Status: DC
  Administered 2020-01-09 – 2020-01-14 (×10): 1 [drp] via OPHTHALMIC
  Filled 2020-01-09: qty 5

## 2020-01-09 MED ORDER — PREDNISONE 5 MG PO TABS
5.0000 mg | ORAL_TABLET | Freq: Every day | ORAL | Status: DC
  Administered 2020-01-10 – 2020-01-14 (×5): 5 mg via ORAL
  Filled 2020-01-09 (×5): qty 1

## 2020-01-09 MED ORDER — PANTOPRAZOLE SODIUM 40 MG PO TBEC
40.0000 mg | DELAYED_RELEASE_TABLET | Freq: Every day | ORAL | Status: DC
  Administered 2020-01-10 – 2020-01-14 (×5): 40 mg via ORAL
  Filled 2020-01-09 (×5): qty 1

## 2020-01-09 MED ORDER — HEPARIN (PORCINE) 25000 UT/250ML-% IV SOLN
700.0000 [IU]/h | INTRAVENOUS | Status: DC
  Administered 2020-01-09: 600 [IU]/h via INTRAVENOUS
  Administered 2020-01-11: 700 [IU]/h via INTRAVENOUS
  Filled 2020-01-09 (×2): qty 250

## 2020-01-09 NOTE — Progress Notes (Signed)
ANTICOAGULATION CONSULT NOTE - Initial Consult  Pharmacy Consult:  Heparin Indication: atrial fibrillation  Allergies  Allergen Reactions  . Penicillins Hives and Swelling    Has patient had a PCN reaction causing immediate rash, facial/tongue/throat swelling, SOB or lightheadedness with hypotension: Yes Has patient had a PCN reaction causing severe rash involving mucus membranes or skin necrosis: No Has patient had a PCN reaction that required hospitalization: No Has patient had a PCN reaction occurring within the last 10 years: No If all of the above answers are "NO", then may proceed with Cephalosporin use.   . Brimonidine Other (See Comments)    Don't remember  . Other     Nuts causes blisters No spices coffee  . Penicillin G Other (See Comments)  . Timolol Maleate Other (See Comments)    Pt becomes faint  . Sulfa Antibiotics Rash  . Sulfasalazine Rash    Patient Measurements: Height: 5\' 3"  (160 cm) Weight: 44.8 kg (98 lb 12.8 oz) IBW/kg (Calculated) : 52.4 Heparin Dosing Weight: 44 kg  Vital Signs: Temp: 97.5 F (36.4 C) (05/26 1948) Temp Source: Oral (05/26 1948) BP: 165/76 (05/26 1948) Pulse Rate: 90 (05/26 1948)  Labs: Recent Labs    01/09/20 1238 01/09/20 1520  HGB 14.4  --   HCT 44.7  --   PLT 151  --   CREATININE 1.40*  --   TROPONINIHS 10 10    Estimated Creatinine Clearance: 20.8 mL/min (A) (by C-G formula based on SCr of 1.4 mg/dL (H)).   Medical History: Past Medical History:  Diagnosis Date  . Atrial fibrillation ([facility])   . Hypertension   . Immune system disorder ([facility])   . Polymyalgia ([facility])     Assessment: 67 YOF with history of Afib presented with thoracic pain.  Pharmacy consulted to transition from Eliquis to IV heparin, last Eliquis dose was earlier today 01/09/20.  Baseline labs reviewed.  Goal of Therapy:  Heparin level 0.3-0.7 units/ml aPTT 66-102 seconds Monitor platelets by anticoagulation protocol: Yes   Plan:  Heparin  gtt at 600 units/hr - no bolus with recent Eliquis  Check 8 hr aPTT and heparin level Daily heparin level, aPTT and CBC  Fayette Gasner D. 01/11/20, PharmD, BCPS, BCCCP 01/09/2020, 10:29 PM

## 2020-01-09 NOTE — ED Provider Notes (Signed)
[facility] EMERGENCY DEPARTMENT Provider Note   CSN: [id number] Arrival date & time: 01/09/20  1124     History Chief Complaint  Patient presents with  . left upper back pain    Jodi Snyder is a 86 y.o. female.  HPI 86 year old female presents with sudden onset left back pain.  Started while she was trying to put on her bra and reaching around to her back.  This occurred about 45 minutes prior to arrival.  No associated shortness of breath, diaphoresis, or nausea/vomiting.  She is having some discomfort in her left neck as well.  The pain in her back is essentially gone through no acute intervention.  Still little discomfort in her neck.  No specific chest pain though it was entering into her axilla.  No abdominal pain.   Past Medical History:  Diagnosis Date  . Atrial fibrillation ([place])   . Hypertension   . Immune system disorder ([place])   . Polymyalgia [facility])     Patient Active Problem List   Diagnosis Date Noted  . Chest pain 01/09/2020  . Supraventricular tachycardia ([place]) 09/29/2019  . Hypokalemia   . 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[place]) 08/19/2018  . Asymptomatic microscopic hematuria 01/05/2018  . Paroxysmal atrial fibrillation ([place]) 01/05/2018  . Scoliosis 10/18/2017  . Thoracogenic scoliosis of thoracolumbar region 10/08/2017  . Glaucoma 06/21/2016  . Drug therapy 01/28/2016  . Benign hypertension with CKD (chronic kidney disease) stage III 01/16/2016  . Discoid lupus erythematosus 01/16/2016  . Generalized anxiety disorder 01/16/2016  . Osteoporosis 01/16/2016  . Polymyalgia rheumatica ([place]) 01/16/2016  . Closed fracture of distal end of left radius 11/11/2014  . Dry eye syndrome 08/03/2012  . Other states following surgery of eye and adnexa 04/21/2012  . 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 Low-tension glaucoma, unspecified eye, stage unspecified 04/07/2012  . Pseudophakia of both eyes 04/07/2012    Past Surgical History:  Procedure  Laterality Date  . ABDOMINAL HYSTERECTOMY    . BLADDER SURGERY    . OOPHORECTOMY       OB History   No obstetric history on file.     Family History  Problem Relation Age of Onset  . Heart attack Mother   . Stroke Father   . Anuerysm Father     Social History   Tobacco Use  . Smoking status: Former Research scientist (life sciences)  . Smokeless tobacco: Never Used  Substance Use Topics  . Alcohol use: No  . Drug use: No    Home Medications Prior to Admission medications   Medication Sig Start Date End Date Taking? Authorizing Provider  ALPRAZolam Duanne Moron) 0.5 MG tablet Take 0.25 mg by mouth 2 (two) times daily. Supper time and bedtime 04/20/18   [provider]  Biotin (BIOTIN 5000) 5 MG CAPS Take 5 mg by mouth daily.    [provider]  brinzolamide (AZOPT) 1 % ophthalmic suspension Place 1 drop into both eyes 3 (three) times daily.     [provider]  cycloSPORINE (RESTASIS) 0.05 % ophthalmic emulsion Place 1 drop into both eyes 2 (two) times daily.     [provider]  diltiazem (CARDIZEM CD) 120 MG 24 hr capsule Take 1 capsule (120 mg total) by mouth daily. 10/31/19   Camnitz, Ocie Doyne, MD  diltiazem (CARDIZEM) 30 MG tablet Take 1-2 tablets every 4 hours AS NEEDED for afib heart rate >100 09/30/19   Asencion Noble, MD  ELIQUIS 2.5 MG TABS tablet TAKE 1 TABLET BY MOUTH TWICE  DAILY 10/02/19   Camnitz, Andree Coss, MD  esomeprazole (NEXIUM) 40 MG capsule Take 40 mg by mouth daily.     [provider]  ferrous sulfate 325 (65 FE) MG tablet Take 325 mg by mouth daily.    [provider]  hydroxychloroquine (PLAQUENIL) 200 MG tablet Take 200 mg by mouth daily.    [provider]  ketorolac (ACULAR) 0.4 % SOLN Apply 1 drop to eye 2 (two) times daily. 09/04/19   [provider]  lisinopril (ZESTRIL) 10 MG tablet TAKE 1 TABLET(10 MG) BY MOUTH DAILY 12/10/19   Camnitz, Andree Coss, MD  metroNIDAZOLE (METROCREAM) 0.75 % cream Apply 1  application topically daily. 08/08/19   [provider]  Multiple Vitamins-Minerals (ICAPS AREDS 2 PO) Take 2 capsules by mouth 2 (two) times daily.    [provider]  predniSONE (DELTASONE) 5 MG tablet Take 5 mg by mouth daily.  01/13/18   [provider]  ROCKLATAN 0.02-0.005 % SOLN Apply 1 drop to eye at bedtime. 08/31/19   [provider]  traMADol-acetaminophen (ULTRACET) 37.5-325 MG tablet Take 1 tablet by mouth 2 (two) times daily.     [provider]    Allergies    Penicillins, Brimonidine, Other, Penicillin g, Timolol maleate, Sulfa antibiotics, and Sulfasalazine  Review of Systems   Review of Systems  Respiratory: Negative for shortness of breath.   Cardiovascular: Negative for chest pain.  Gastrointestinal: Negative for abdominal pain.  Musculoskeletal: Positive for back pain.  All other systems reviewed and are negative.   Physical Exam Updated Vital Signs BP 129/71 (BP Location: Right Arm)   Pulse 62   Temp 98.3 F (36.8 C) (Oral)   Resp 18   Ht 5\' 3"  (1.6 m)   Wt 45.4 kg   SpO2 100%   BMI 17.71 kg/m   Physical Exam Vitals and nursing note reviewed.  Constitutional:      General: She is not in acute distress.    Appearance: She is well-developed. She is not ill-appearing or diaphoretic.  HENT:     Head: Normocephalic and atraumatic.     Right Ear: External ear normal.     Left Ear: External ear normal.     Nose: Nose normal.  Eyes:     General:        Right eye: No discharge.        Left eye: No discharge.  Cardiovascular:     Rate and Rhythm: Normal rate and regular rhythm.     Pulses:          Radial pulses are 2+ on the right side and 2+ on the left side.     Heart sounds: Normal heart sounds.  Pulmonary:     Effort: Pulmonary effort is normal.     Breath sounds: Normal breath sounds.  Abdominal:     Palpations: Abdomen is soft.     Tenderness: There is no abdominal tenderness.  Musculoskeletal:        Back:  Skin:    General: Skin is warm and dry.  Neurological:     Mental Status: She is alert.  Psychiatric:        Mood and Affect: Mood is not anxious.     ED Results / Procedures / Treatments   Labs (all labs ordered are listed, but only abnormal results are displayed) Labs Reviewed  CBC WITH DIFFERENTIAL/PLATELET - Abnormal; Notable for the following components:      Result Value  MCV 101.6 (*)    All other components within normal limits  BASIC METABOLIC PANEL - Abnormal; Notable for the following components:   BUN 30 (*)    Creatinine, Ser 1.40 (*)    GFR calc non Af Amer 34 (*)    GFR calc Af Amer 40 (*)    All other components within normal limits  SARS CORONAVIRUS 2 BY RT PCR (HOSPITAL ORDER, PERFORMED IN [facility])  D-DIMER, QUANTITATIVE (NOT AT [facility])  TROPONIN I (HIGH SENSITIVITY)  TROPONIN I (HIGH SENSITIVITY)    EKG EKG Interpretation  Date/Time:  Wednesday Jan 09 2020 11:24:43 EDT Ventricular Rate:  77 PR Interval:    QRS Duration: 96 QT Interval:  354 QTC Calculation: 400 R Axis:   79 Text Interpretation: Accelerated Junctional rhythm Nonspecific ST abnormality Abnormal ECG ST depressions seem more prominent compared to Feb 2021 Confirmed by Pricilla Loveless [phone]) on 01/09/2020 11:39:22 AM   EKG Interpretation  Date/Time:  Wednesday Jan 09 2020 12:18:40 EDT Ventricular Rate:  68 PR Interval:    QRS Duration: 96 QT Interval:  436 QTC Calculation: 464 R Axis:   81 Text Interpretation: Sinus rhythm Short PR interval Borderline right axis deviation ST depressions improved compared to earlier in the day Confirmed by Pricilla Loveless [phone]) on 01/09/2020 12:21:21 PM        Radiology DG Chest 2 View  Result Date: 01/09/2020 CLINICAL DATA:  Mid left chest pain since last night. EXAM: CHEST - 2 VIEW COMPARISON:  09/29/2019 FINDINGS: Normal sized heart. Mildly tortuous and partially calcified thoracic aorta. The lungs remain  hyperexpanded with a stable small calcified granuloma at the right lung base. Biapical pleural and parenchymal scarring has not changed significantly, including small areas of calcification. Interval possible calcified pleural plaque underneath an EKG lead at the superior aspect of the left lung. Diffuse osteopenia. Mild to moderate scoliosis. IMPRESSION: 1. No acute abnormality. 2. Stable changes of COPD. 3. Interval possible calcified pleural plaque underneath an EKG lead at the superior aspect of the left lung. Electronically Signed   By: Beckie Salts M.D.   On: 01/09/2020 12:46    Procedures Procedures (including critical care time)  Medications Ordered in ED Medications  aspirin chewable tablet 324 mg (324 mg Oral Refused 01/09/20 1318)  sodium chloride 0.9 % bolus 500 mL (0 mLs Intravenous Stopped 01/09/20 1422)    ED Course  I have reviewed the triage vital signs and the nursing notes.  Pertinent labs & imaging results that were available during my care of the patient were reviewed by me and considered in my medical decision making (see chart for details).    MDM Rules/Calculators/A&P                      Patient's initial ECG is concerning for some mild ST depressions that were not there in the past.  This is as her chest pain/back pain seem to be resolving.  Repeat ECG seems better.  Initial troponin is negative.  My concern is that she perhaps is having an early non-STEMI or cardiac ischemia.  Thankfully she is feeling much better now.  However with these subtle changes, I think she will need to be admitted for ACS work-up.  Discussed with Dr. Ella Jubilee. Final Clinical Impression(s) / ED Diagnoses Final diagnoses:  Acute left-sided thoracic back pain    Rx / DC Orders ED Discharge Orders    None       Criss Alvine,  Lorin Picket, MD 01/09/20 [phone]

## 2020-01-09 NOTE — Progress Notes (Signed)
Patient in room is hungry and complaining of pain. MD paged for admission orders.

## 2020-01-09 NOTE — ED Triage Notes (Signed)
Severe pain in left side, left upper back and up into neck.  Pain hit all of a sudden while doing dishes this morning.  Brought to Ed by friend.

## 2020-01-09 NOTE — Progress Notes (Signed)
Patient settled in room. No complaints at this time. Will monitor.

## 2020-01-09 NOTE — H&P (Addendum)
History and Physical    Jodi Snyder LYY:[id number] DOB: Jan 12, 1932 DOA: 01/09/2020  PCP: Elspeth Cho., MD  Patient coming from: Home.  Chief Complaint: Chest pain.  HPI: Jodi Snyder is a 86 y.o. female with history of A. fib hypertension polymyalgia rheumatica discoid lupus presents to the ER at [facility] with complaint of chest pain.  Patient states she was doing fine and was doing her dishes when suddenly she started having left-sided chest pain which was radiating up her back to the neck.  Just sudden severe stabbing in nature later became more dull and persistent.  No shortness of breath productive cough or fever chills.  Patient presents to the ER.  ED Course: In the ER patient was afebrile not hypoxic.  Chest x-ray was unremarkable EKG was showing mild ST depression in the lateral leads which improved with the second EKG.  D-dimer high sensitive troponins were negative.  Patient is being admitted for further management of ACS rule out.  On exam patient has tenderness in the mid spine.  For which I ordered MRI of the T-spine.  Labs are largely unremarkable except a creatinine of 1.4.  Review of Systems: As per HPI, rest all negative.   Past Medical History:  Diagnosis Date  . Atrial fibrillation (HCC)   . Hypertension   . Immune system disorder (HCC)   . Polymyalgia (HCC)     Past Surgical History:  Procedure Laterality Date  . ABDOMINAL HYSTERECTOMY    . BLADDER SURGERY    . OOPHORECTOMY       reports that she has quit smoking. She has never used smokeless tobacco. She reports that she does not drink alcohol or use drugs.  Allergies  Allergen Reactions  . Penicillins Hives and Swelling    Has patient had a PCN reaction causing immediate rash, facial/tongue/throat swelling, SOB or lightheadedness with hypotension: Yes Has patient had a PCN reaction causing severe rash involving mucus membranes or skin necrosis: No Has patient had a PCN reaction that  required hospitalization: No Has patient had a PCN reaction occurring within the last 10 years: No If all of the above answers are "NO", then may proceed with Cephalosporin use.   . Brimonidine Other (See Comments)    Don't remember  . Other     Nuts causes blisters No spices coffee  . Penicillin G Other (See Comments)  . Timolol Maleate Other (See Comments)    Pt becomes faint  . Sulfa Antibiotics Rash  . Sulfasalazine Rash    Family History  Problem Relation Age of Onset  . Heart attack Mother   . Stroke Father   . Anuerysm Father     Prior to Admission medications   Medication Sig Start Date End Date Taking? Authorizing Provider  ALPRAZolam Prudy Feeler) 0.5 MG tablet Take 0.25 mg by mouth 2 (two) times daily.  04/20/18  Yes [provider]  Biotin (BIOTIN 5000) 5 MG CAPS Take 5 mg by mouth daily.    Yes [provider]  brinzolamide (AZOPT) 1 % ophthalmic suspension Place 1 drop into both eyes 3 (three) times daily.    Yes [provider]  cycloSPORINE (RESTASIS) 0.05 % ophthalmic emulsion Place 1 drop into both eyes 2 (two) times daily.    Yes [provider]  diltiazem (CARDIZEM CD) 120 MG 24 hr capsule Take 1 capsule (120 mg total) by mouth daily. 10/31/19  Yes Camnitz, Will Daphine Deutscher, MD  diltiazem (CARDIZEM) 30 MG tablet  Take 1-2 tablets every 4 hours AS NEEDED for afib heart rate >100 Patient taking differently: Take 30-60 mg by mouth See admin instructions. Take 1-2 tablets (30-60 mg totally) by mouth every 4 hours AS NEEDED for afib heart rate >100 09/30/19  Yes Claudean Severance, MD  ELIQUIS 2.5 MG TABS tablet TAKE 1 TABLET BY MOUTH TWICE DAILY Patient taking differently: Take 2.5 mg by mouth 2 (two) times daily.  10/02/19  Yes Camnitz, Will Daphine Deutscher, MD  esomeprazole (NEXIUM) 40 MG capsule Take 40 mg by mouth daily.    Yes [provider]  ferrous sulfate 325 (65 FE) MG tablet Take 325 mg by mouth daily.   Yes [provider]    hydroxychloroquine (PLAQUENIL) 200 MG tablet Take 200 mg by mouth daily.   Yes [provider]  ketorolac (ACULAR) 0.4 % SOLN Apply 1 drop to eye 2 (two) times daily. 09/04/19  Yes [provider]  lisinopril (ZESTRIL) 10 MG tablet TAKE 1 TABLET(10 MG) BY MOUTH DAILY Patient taking differently: Take 10 mg by mouth daily.  12/10/19  Yes Camnitz, Will Daphine Deutscher, MD  metroNIDAZOLE (METROCREAM) 0.75 % cream Apply 1 application topically daily. 08/08/19  Yes [provider]  Multiple Vitamins-Minerals (ICAPS AREDS 2 PO) Take 2 capsules by mouth 2 (two) times daily.   Yes [provider]  predniSONE (DELTASONE) 5 MG tablet Take 5 mg by mouth daily.  01/13/18  Yes [provider]  ROCKLATAN 0.02-0.005 % SOLN Apply 1 drop to eye at bedtime. 08/31/19  Yes [provider]  traMADol-acetaminophen (ULTRACET) 37.5-325 MG tablet Take 1 tablet by mouth 2 (two) times daily.    Yes [provider]    Physical Exam: Constitutional: Moderately built and nourished. Vitals:   01/09/20 1500 01/09/20 1630 01/09/20 1823 01/09/20 1948  BP: 128/84 (!) 143/74 121/88 (!) 165/76  Pulse: 60 60 (!) 59 90  Resp: 14 (!) 22 (!) 22 20  Temp:   98.9 F (37.2 C) (!) 97.5 F (36.4 C)  TempSrc:   Oral Oral  SpO2: 100% 100% 100% 100%  Weight:   44.8 kg   Height:   5\' 3"  (1.6 m)    Eyes: Anicteric no pallor. ENMT: No discharge from the ears eyes nose or mouth. Neck: No mass felt.  No neck rigidity. Respiratory: No rhonchi or crepitations. Cardiovascular: S1-S2 heard. Abdomen: Soft nontender bowel sounds present. Musculoskeletal: Tenderness in the mid spine.   Skin: No rash. Neurologic: Alert awake oriented to time place and person.  Moves all extremities. Psychiatric: Appears normal.   Labs on Admission: I have personally reviewed following labs and imaging studies  CBC: Recent Labs  Lab 01/09/20 1238  WBC 6.2  NEUTROABS 5.2  HGB 14.4  HCT 44.7  MCV  101.6*  PLT 151   Basic Metabolic Panel: Recent Labs  Lab 01/09/20 1238  NA 140  K 4.6  CL 102  CO2 26  GLUCOSE 99  BUN 30*  CREATININE 1.40*  CALCIUM 9.2   GFR: Estimated Creatinine Clearance: 20.8 mL/min (A) (by C-G formula based on SCr of 1.4 mg/dL (H)). Liver Function Tests: No results for input(s): AST, ALT, ALKPHOS, BILITOT, PROT, ALBUMIN in the last 168 hours. No results for input(s): LIPASE, AMYLASE in the last 168 hours. No results for input(s): AMMONIA in the last 168 hours. Coagulation Profile: No results for input(s): INR, PROTIME in the last 168 hours. Cardiac Enzymes: No results for input(s): CKTOTAL, CKMB, CKMBINDEX, TROPONINI in the last  168 hours. BNP (last 3 results) No results for input(s): PROBNP in the last 8760 hours. HbA1C: No results for input(s): HGBA1C in the last 72 hours. CBG: No results for input(s): GLUCAP in the last 168 hours. Lipid Profile: No results for input(s): CHOL, HDL, LDLCALC, TRIG, CHOLHDL, LDLDIRECT in the last 72 hours. Thyroid Function Tests: No results for input(s): TSH, T4TOTAL, FREET4, T3FREE, THYROIDAB in the last 72 hours. Anemia Panel: No results for input(s): VITAMINB12, FOLATE, FERRITIN, TIBC, IRON, RETICCTPCT in the last 72 hours. Urine analysis:    Component Value Date/Time   COLORURINE STRAW (A) 08/19/2018 0838   APPEARANCEUR CLEAR 08/19/2018 0838   LABSPEC 1.005 08/19/2018 0838   PHURINE 9.0 (H) 08/19/2018 0838   GLUCOSEU NEGATIVE 08/19/2018 0838   HGBUR NEGATIVE 08/19/2018 0838   BILIRUBINUR NEGATIVE 08/19/2018 0838   KETONESUR NEGATIVE 08/19/2018 0838   PROTEINUR NEGATIVE 08/19/2018 0838   NITRITE NEGATIVE 08/19/2018 0838   LEUKOCYTESUR NEGATIVE 08/19/2018 0838   Sepsis Labs: @LABRCNTIP (procalcitonin:4,lacticidven:4) ) Recent Results (from the past 240 hour(s))  SARS Coronavirus 2 by RT PCR (hospital order, performed in [facility] hospital lab) Nasopharyngeal Nasopharyngeal Swab     Status: None    Collection Time: 01/09/20  2:53 PM   Specimen: Nasopharyngeal Swab  Result Value Ref Range Status   SARS Coronavirus 2 NEGATIVE NEGATIVE Final    Comment: (NOTE) SARS-CoV-2 target nucleic acids are NOT DETECTED. The SARS-CoV-2 RNA is generally detectable in upper and lower respiratory specimens during the acute phase of infection. The lowest concentration of SARS-CoV-2 viral copies this assay can detect is 250 copies / mL. A negative result does not preclude SARS-CoV-2 infection and should not be used as the sole basis for treatment or other patient management decisions.  A negative result may occur with improper specimen collection / handling, submission of specimen other than nasopharyngeal swab, presence of viral mutation(s) within the areas targeted by this assay, and inadequate number of viral copies (<250 copies / mL). A negative result must be combined with clinical observations, patient history, and epidemiological information. Fact Sheet for Patients:   01/11/20 Fact Sheet for Healthcare Providers: BoilerBrush.com.cy This test is not yet approved or cleared  by the https://pope.com/ FDA and has been authorized for detection and/or diagnosis of SARS-CoV-2 by FDA under an Emergency Use Authorization (EUA).  This EUA will remain in effect (meaning this test can be used) for the duration of the COVID-19 declaration under Section 564(b)(1) of the Act, 21 U.S.C. section 360bbb-3(b)(1), unless the authorization is terminated or revoked sooner. Performed at [facility], [address] [place], [place]      Radiological Exams on Admission: DG Chest 2 View  Result Date: 01/09/2020 CLINICAL DATA:  Mid left chest pain since last night. EXAM: CHEST - 2 VIEW COMPARISON:  09/29/2019 FINDINGS: Normal sized heart. Mildly tortuous and partially calcified thoracic aorta. The lungs remain hyperexpanded with a stable  small calcified granuloma at the right lung base. Biapical pleural and parenchymal scarring has not changed significantly, including small areas of calcification. Interval possible calcified pleural plaque underneath an EKG lead at the superior aspect of the left lung. Diffuse osteopenia. Mild to moderate scoliosis. IMPRESSION: 1. No acute abnormality. 2. Stable changes of COPD. 3. Interval possible calcified pleural plaque underneath an EKG lead at the superior aspect of the left lung. Electronically Signed   By: 10/01/2019 M.D.   On: 01/09/2020 12:46    EKG: Independently reviewed.  Normal  sinus rhythm with ST depression in lateral leads.  Assessment/Plan Principal Problem:   Chest pain Active Problems:   Discoid lupus erythematosus   Paroxysmal atrial fibrillation (HCC)   Polymyalgia rheumatica (HCC)   Thoracogenic scoliosis of thoracolumbar region   Acute left-sided thoracic back pain    1. Chest pain concerning for ACS for which we will cycle cardiac markers and consult cardiology.  Patient is presently on anticoagulants.  Aspirin.  Pain medications.  Since patient has tenderness in the mid back I have ordered MRI T-spine and x-ray of the left rib series. 2. A. fib rate controlled presently on Cardizem.  Anticipation of procedure will keep patient on heparin and hold her apixaban. 3. History of polymyalgia rheumatica and discoid lupus on prednisone and hydroxychloroquine. 4. Hypertension on lisinopril and Cardizem. 5. Chronic kidney disease stage II.  Follow metabolic panel.  If creatinine worsens may have to hold lisinopril. 6. History of scoliosis.   DVT prophylaxis: Heparin infusion. Code Status: DNR. Family Communication: Patient's stepson. Disposition Plan: Home. Consults called: Cardiology. Admission status: Observation.   Rise Patience MD [facility] Pager [phone].  If 7PM-7AM, please contact night-coverage [email or web address] Password  [facility]  01/09/2020, 10:18 PM

## 2020-01-09 NOTE — Progress Notes (Signed)
[name] is an 86 year old female who has a significant past medical history for scoliosis, polymyalgia rheumatica, paroxysmal atrial fibrillation, osteoporosis and chronic kidney disease who presents with acute onset of thoracic pain.  Apparently patient was getting herself dressed when suddenly experienced back and lateral chest pain.  No significant associated symptoms.  Blood pressure 135/70, heart rate 60, respiratory rate 17, oxygen saturation 100% on room air.  Her troponin I is 10, chest radiograph with hyperinflation, chronic right apical scarring.  No infiltrates.  #1 EKG had 77 bpm, normal axis, normal intervals, sinus rhythm, no significant ST segment or T wave changes. #2 EKG  68 bmp with no significant changes.  Patient will be admitted to a cardiac telemetry unit to rule out acute coronary syndrome.

## 2020-01-10 ENCOUNTER — Encounter (HOSPITAL_COMMUNITY): Payer: Self-pay | Admitting: Internal Medicine

## 2020-01-10 DIAGNOSIS — M546 Pain in thoracic spine: Secondary | ICD-10-CM

## 2020-01-10 DIAGNOSIS — L930 Discoid lupus erythematosus: Secondary | ICD-10-CM

## 2020-01-10 DIAGNOSIS — R5381 Other malaise: Secondary | ICD-10-CM

## 2020-01-10 DIAGNOSIS — Z66 Do not resuscitate: Secondary | ICD-10-CM

## 2020-01-10 DIAGNOSIS — M353 Polymyalgia rheumatica: Secondary | ICD-10-CM

## 2020-01-10 DIAGNOSIS — R079 Chest pain, unspecified: Secondary | ICD-10-CM | POA: Diagnosis not present

## 2020-01-10 DIAGNOSIS — Z789 Other specified health status: Secondary | ICD-10-CM

## 2020-01-10 DIAGNOSIS — M4135 Thoracogenic scoliosis, thoracolumbar region: Secondary | ICD-10-CM

## 2020-01-10 DIAGNOSIS — I480 Paroxysmal atrial fibrillation: Secondary | ICD-10-CM

## 2020-01-10 LAB — CBC
HCT: 44.1 % (ref 36.0–46.0)
Hemoglobin: 14.3 g/dL (ref 12.0–15.0)
MCH: 32.4 pg (ref 26.0–34.0)
MCHC: 32.4 g/dL (ref 30.0–36.0)
MCV: 100 fL (ref 80.0–100.0)
Platelets: 146 10*3/uL — ABNORMAL LOW (ref 150–400)
RBC: 4.41 MIL/uL (ref 3.87–5.11)
RDW: 13 % (ref 11.5–15.5)
WBC: 6.1 10*3/uL (ref 4.0–10.5)
nRBC: 0 % (ref 0.0–0.2)

## 2020-01-10 LAB — APTT
aPTT: 67 seconds — ABNORMAL HIGH (ref 24–36)
aPTT: 66 seconds — ABNORMAL HIGH (ref 24–36)

## 2020-01-10 LAB — HEPARIN LEVEL (UNFRACTIONATED): Heparin Unfractionated: 1.04 IU/mL — ABNORMAL HIGH (ref 0.30–0.70)

## 2020-01-10 MED ORDER — POLYETHYLENE GLYCOL 3350 17 G PO PACK: 17.0000 g | PACK | Freq: Two times a day (BID) | ORAL | Status: DC | PRN

## 2020-01-10 MED ORDER — SENNOSIDES-DOCUSATE SODIUM 8.6-50 MG PO TABS: 1.0000 | ORAL_TABLET | Freq: Two times a day (BID) | ORAL | Status: DC | PRN

## 2020-01-10 NOTE — Progress Notes (Signed)
ANTICOAGULATION CONSULT NOTE Pharmacy Consult:  Heparin Indication: atrial fibrillation  Allergies  Allergen Reactions  . Penicillins Hives and Swelling    Has patient had a PCN reaction causing immediate rash, facial/tongue/throat swelling, SOB or lightheadedness with hypotension: Yes Has patient had a PCN reaction causing severe rash involving mucus membranes or skin necrosis: No Has patient had a PCN reaction that required hospitalization: No Has patient had a PCN reaction occurring within the last 10 years: No If all of the above answers are "NO", then may proceed with Cephalosporin use.   . Brimonidine Other (See Comments)    Don't remember  . Other     Nuts causes blisters No spices coffee  . Penicillin G Other (See Comments)  . Timolol Maleate Other (See Comments)    Pt becomes faint  . Sulfa Antibiotics Rash  . Sulfasalazine Rash    Patient Measurements: Height: 5\' 3"  (160 cm) Weight: 44 kg (97 lb) IBW/kg (Calculated) : 52.4 Heparin Dosing Weight: 44 kg  Vital Signs: Temp: 98.2 F (36.8 C) (05/27 0809) Temp Source: Oral (05/27 0809) BP: 150/75 (05/27 0809) Pulse Rate: 73 (05/27 0809)  Labs: Recent Labs    01/09/20 1238 01/09/20 1520 01/10/20 0720  HGB 14.4  --  14.3  HCT 44.7  --  44.1  PLT 151  --  146*  APTT  --   --  67*  HEPARINUNFRC  --   --  1.04*  CREATININE 1.40*  --   --   TROPONINIHS 10 10  --     Estimated Creatinine Clearance: 20.4 mL/min (A) (by C-G formula based on SCr of 1.4 mg/dL (H)).   Medical History: Past Medical History:  Diagnosis Date  . Atrial fibrillation (HCC)   . Hypertension   . Immune system disorder (HCC)   . Polymyalgia (HCC)     Assessment: 89 YOF with history of Afib presented with thoracic pain.  Pharmacy consulted to transition from Eliquis to IV heparin, last Eliquis dose was 01/09/20.   -aPTT at goal, CBC stable  Goal of Therapy:  Heparin level 0.3-0.7 units/ml aPTT 66-102 seconds Monitor platelets by  anticoagulation protocol: Yes   Plan:  Continue Heparin gtt at 600 units/hr Check 8 hr aPTT to confirm Daily heparin level, aPTT and CBC  01/11/20, PharmD Clinical Pharmacist **Pharmacist phone directory can now be found on [email or web address] ([email or web address]).  Listed under [facility].

## 2020-01-10 NOTE — Progress Notes (Signed)
PROGRESS NOTE  Jodi Snyder WEX:[id number] DOB: 1931-09-28   PCP: [name]., MD  Patient is from: Home.  Lives alone.  Uses walker at baseline.  DOA: 01/09/2020 LOS: 0  Brief Narrative / Interim history: 86 year old female with history of A. fib, HTN, PMR and discoid lupus who presented to [facility] ED with chest pain, and admitted for ACS rule out.  In ED, CXR without acute finding.  EKG with mild ST changes laterally but improved on repeat.  D-dimer and high-sensitivity troponin negative.  Admitted for ACS rule out.  Cardiology consulted.  She also had MRI of the spine ordered.   The next day, chest pain improved significantly.  MRI without acute finding.  Cardiology recommended Myoview.   Subjective: Seen and examined earlier this morning.  No major events overnight of this morning.  Reports significant improvement in her chest pain.  Chest pain is worse with certain movements.  She denies shortness of breath.  Denies GI or UTI symptoms.  Denies skin rash.  Objective: Vitals:   01/10/20 0542 01/10/20 0809 01/10/20 0946 01/10/20 1043  BP: (!) 148/97 (!) 150/75 (!) 151/89 (!) 156/86  Pulse: 63 73  72  Resp: 20 16  16   Temp: 98.3 F (36.8 C) 98.2 F (36.8 C)  98.4 F (36.9 C)  TempSrc: Oral Oral  Oral  SpO2: 99% 99%  97%  Weight: 44 kg     Height:        Intake/Output Summary (Last 24 hours) at 01/10/2020 1449 Last data filed at 01/10/2020 0300 Gross per 24 hour  Intake 20.09 ml  Output -  Net 20.09 ml   Filed Weights   01/09/20 1135 01/09/20 1823 01/10/20 0542  Weight: 45.4 kg 44.8 kg 44 kg    Examination:  GENERAL: No apparent distress.  Nontoxic. HEENT: MMM.  Vision and hearing grossly intact.  NECK: Supple.  No apparent JVD.  RESP: On room air.  No IWOB.  Fair aeration bilaterally. CVS:  RRR. Heart sounds normal.  ABD/GI/GU: BS+. Abd soft, NTND.  MSK/EXT:  Moves extremities. No apparent deformity. No edema.  SKIN: Seborrheic dermatitis over left lateral  chest NEURO: Awake, alert and oriented appropriately.  No apparent focal neuro deficit. PSYCH: Calm. Normal affect.  Procedures:  None  Microbiology summarized: COVID-19 PCR negative.  Assessment & Plan: Atypical chest pain: Initial EKG with mild ST changes in lateral leads that has improved on repeat EKG.  Serial troponin negative.  Chest pain basically resolved. -Appreciate cardiology input-plan for Myoview in the morning -Already on IV heparin for A. fib.  Paroxysmal A. Fib: Rate controlled. -Continue Cardizem -On IV heparin.  Will resume Eliquis after Myoview.  History of PMR and discoid lupus- -continue home prednisone and Plaquenil  Essential hypertension: BP slightly elevated. -Continue home lisinopril and Cardizem -As needed hydralazine  AKI on CKD-3A: Baseline Cr~1.0-1.1> 1.4 (admit) -Continue monitoring -Avoid nephrotoxic meds  Anxiety/chronic pain/scoliosis -Continue home Xanax, tramadol and Tylenol            DVT prophylaxis: On heparin drip for A. fib Code Status: DNR/DNI Family Communication: Patient and/or RN. Available if any question.  Status is: Observation   Dispo: The patient is from: Home              Anticipated d/c is to: To be determined              Anticipated d/c date is: 1 day  Patient currently is not medically stable to d/c.       Consultants:  Cardiology   Sch Meds:  Scheduled Meds: . ALPRAZolam  0.25 mg Oral BID  . brinzolamide  1 drop Both Eyes TID  . cycloSPORINE  1 drop Both Eyes BID  . diltiazem  120 mg Oral Daily  . ferrous sulfate  325 mg Oral Daily  . hydroxychloroquine  200 mg Oral Daily  . ketorolac  1 drop Both Eyes BID  . lisinopril  10 mg Oral Daily  . metroNIDAZOLE   Topical Daily  . Netarsudil-Latanoprost  1 drop Ophthalmic QHS  . pantoprazole  40 mg Oral Daily  . predniSONE  5 mg Oral QAC breakfast  . traMADol-acetaminophen  1 tablet Oral BID   Continuous Infusions: . heparin 600  Units/hr (01/09/20 2334)   PRN Meds:.acetaminophen, ondansetron (ZOFRAN) IV  Antimicrobials: Anti-infectives (From admission, onward)   Start     Dose/Rate Route Frequency Ordered Stop   01/10/20 1000  hydroxychloroquine (PLAQUENIL) tablet 200 mg     200 mg Oral Daily 01/09/20 2217         I have personally reviewed the following labs and images: CBC: Recent Labs  Lab 01/09/20 1238 01/10/20 0720  WBC 6.2 6.1  NEUTROABS 5.2  --   HGB 14.4 14.3  HCT 44.7 44.1  MCV 101.6* 100.0  PLT 151 146*   BMP &GFR Recent Labs  Lab 01/09/20 1238  NA 140  K 4.6  CL 102  CO2 26  GLUCOSE 99  BUN 30*  CREATININE 1.40*  CALCIUM 9.2   Estimated Creatinine Clearance: 20.4 mL/min (A) (by C-G formula based on SCr of 1.4 mg/dL (H)). Liver & Pancreas: No results for input(s): AST, ALT, ALKPHOS, BILITOT, PROT, ALBUMIN in the last 168 hours. No results for input(s): LIPASE, AMYLASE in the last 168 hours. No results for input(s): AMMONIA in the last 168 hours. Diabetic: No results for input(s): HGBA1C in the last 72 hours. No results for input(s): GLUCAP in the last 168 hours. Cardiac Enzymes: No results for input(s): CKTOTAL, CKMB, CKMBINDEX, TROPONINI in the last 168 hours. No results for input(s): PROBNP in the last 8760 hours. Coagulation Profile: No results for input(s): INR, PROTIME in the last 168 hours. Thyroid Function Tests: No results for input(s): TSH, T4TOTAL, FREET4, T3FREE, THYROIDAB in the last 72 hours. Lipid Profile: No results for input(s): CHOL, HDL, LDLCALC, TRIG, CHOLHDL, LDLDIRECT in the last 72 hours. Anemia Panel: No results for input(s): VITAMINB12, FOLATE, FERRITIN, TIBC, IRON, RETICCTPCT in the last 72 hours. Urine analysis:    Component Value Date/Time   COLORURINE STRAW (A) 08/19/2018 0838   APPEARANCEUR CLEAR 08/19/2018 0838   LABSPEC 1.005 08/19/2018 0838   PHURINE 9.0 (H) 08/19/2018 0838   GLUCOSEU NEGATIVE 08/19/2018 0838   HGBUR NEGATIVE  08/19/2018 0838   BILIRUBINUR NEGATIVE 08/19/2018 0838   KETONESUR NEGATIVE 08/19/2018 0838   PROTEINUR NEGATIVE 08/19/2018 0838   NITRITE NEGATIVE 08/19/2018 0838   LEUKOCYTESUR NEGATIVE 08/19/2018 0838   Sepsis Labs: Invalid input(s): PROCALCITONIN, LACTICIDVEN  Microbiology: Recent Results (from the past 240 hour(s))  SARS Coronavirus 2 by RT PCR (hospital order, performed in [facility] hospital lab) Nasopharyngeal Nasopharyngeal Swab     Status: None   Collection Time: 01/09/20  2:53 PM   Specimen: Nasopharyngeal Swab  Result Value Ref Range Status   SARS Coronavirus 2 NEGATIVE NEGATIVE Final    Comment: (NOTE) SARS-CoV-2 target nucleic acids are NOT DETECTED. The SARS-CoV-2 RNA  is generally detectable in upper and lower respiratory specimens during the acute phase of infection. The lowest concentration of SARS-CoV-2 viral copies this assay can detect is 250 copies / mL. A negative result does not preclude SARS-CoV-2 infection and should not be used as the sole basis for treatment or other patient management decisions.  A negative result may occur with improper specimen collection / handling, submission of specimen other than nasopharyngeal swab, presence of viral mutation(s) within the areas targeted by this assay, and inadequate number of viral copies (<250 copies / mL). A negative result must be combined with clinical observations, patient history, and epidemiological information. Fact Sheet for Patients:   BoilerBrush.com.cy Fact Sheet for Healthcare Providers: https://pope.com/ This test is not yet approved or cleared  by the [place] FDA and has been authorized for detection and/or diagnosis of SARS-CoV-2 by FDA under an Emergency Use Authorization (EUA).  This EUA will remain in effect (meaning this test can be used) for the duration of the COVID-19 declaration under Section 564(b)(1) of the Act, 21 U.S.C. section  360bbb-3(b)(1), unless the authorization is terminated or revoked sooner. Performed at [facility], [address] [place], [place] [address]     Radiology Studies: DG Ribs Unilateral Left  Result Date: 01/09/2020 CLINICAL DATA:  Left-sided rib pain EXAM: LEFT RIBS - 2 VIEW COMPARISON:  Chest x-ray 01/09/2020 FINDINGS: No fracture or other bone lesions are seen involving the ribs. IMPRESSION: Negative. Electronically Signed   By: Jasmine Pang M.D.   On: 01/09/2020 22:45   MR THORACIC SPINE WO CONTRAST  Result Date: 01/09/2020 CLINICAL DATA:  Mid back pain EXAM: MRI THORACIC SPINE WITHOUT CONTRAST TECHNIQUE: Multiplanar, multisequence MR imaging of the thoracic spine was performed. No intravenous contrast was administered. COMPARISON:  None. FINDINGS: Alignment:  Right convex scoliosis with apex at T11 Vertebrae: No fracture, evidence of discitis, or bone lesion. Cord: No focal disc herniation. No spinal canal or neural foraminal stenosis. Paraspinal and other soft tissues: Biapical opacities, likely scarring. Disc levels: No spinal canal or neural foraminal stenosis. IMPRESSION: 1. No acute abnormality of the thoracic spine. 2. Right convex scoliosis with apex at T11. 3. No spinal canal or neural foraminal stenosis. Electronically Signed   By: Deatra Robinson M.D.   On: 01/09/2020 22:53     Taye T. Gonfa [facility]  If 7PM-7AM, please contact night-coverage [email or web address] Password [facility] 01/10/2020, 2:49 PM

## 2020-01-10 NOTE — TOC Initial Note (Signed)
Transition of Care [facility]) - Initial/Assessment Note    Patient Details  Name: Jodi Snyder MRN: [id number] Date of Birth: Jul 03, 1932  Transition [facility]) CM/SW Contact:    Trula Ore, [place] Phone Number: 01/10/2020, 6:00 PM  Clinical Narrative:                  CSW spoke with patient at bedside. Patient is agreeable to SNF placement. Patient gave CSW permission to fax out initial referral to [facility] area. Patient gave CSW permission to speak with her Lonie Peak about her care. CSW will start insurance authorization for patient.  Pending bed offers. Pending insurance authorization.  CSW will continue to follow. Expected Discharge Plan: Skilled Nursing Facility Barriers to Discharge: Continued Medical Work up   Patient Goals and CMS Choice Patient states their goals for this hospitalization and ongoing recovery are:: to go to skilled nursing facility CMS Medicare.gov Compare Post Acute Care list provided to:: Patient Choice offered to / list presented to : Patient  Expected Discharge Plan and Services Expected Discharge Plan: [place]       Living arrangements for the past 2 months: Single Family Home                                      Prior Living Arrangements/Services Living arrangements for the past 2 months: Single Family Home Lives with:: Self Patient language and need for interpreter reviewed:: Yes Do you feel safe going back to the place where you live?: No   SNF  Need for Family Participation in Patient Care: Yes (Comment) Care giver support system in place?: Yes (comment)   Criminal Activity/Legal Involvement Pertinent to Current Situation/Hospitalization: No - Comment as needed  Activities of Daily Living Home Assistive Devices/Equipment: Walker (specify type), Eyeglasses ADL Screening (condition at time of admission) Patient's cognitive ability adequate to safely complete daily activities?: Yes Is the patient deaf  or have difficulty hearing?: No Does the patient have difficulty seeing, even when wearing glasses/contacts?: No Does the patient have difficulty concentrating, remembering, or making decisions?: No Patient able to express need for assistance with ADLs?: Yes Does the patient have difficulty dressing or bathing?: No Independently performs ADLs?: Yes (appropriate for developmental age) Does the patient have difficulty walking or climbing stairs?: Yes Weakness of Legs: Both Weakness of Arms/Hands: None  Permission Sought/Granted Permission sought to share information with : Case Manager, Family Supports, Chartered certified accountant granted to share information with : Yes, Verbal Permission Granted  Share Information with NAME: Aaron Edelman  Permission granted to share info w AGENCY: SNF  Permission granted to share info w Relationship: Joslyn Hy  Permission granted to share info w Contact Information: Aaron Edelman [phone]  Emotional Assessment Appearance:: Appears stated age Attitude/Demeanor/Rapport: Gracious Affect (typically observed): Calm Orientation: : Oriented to Self, Oriented to Place, Oriented to  Time, Oriented to Situation Alcohol / Substance Use: Not Applicable Psych Involvement: No (comment)  Admission diagnosis:  Chest pain [R07.9] Acute left-sided thoracic back pain [M54.6] Patient Active Problem List   Diagnosis Date Noted  . Chest pain 01/09/2020  . Acute left-sided thoracic back pain   . Supraventricular tachycardia ([place]) 09/29/2019  . Hypokalemia   . 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[place]) 08/19/2018  . Asymptomatic microscopic hematuria 01/05/2018  . Paroxysmal atrial fibrillation ([place]) 01/05/2018  . Scoliosis 10/18/2017  . Thoracogenic scoliosis of thoracolumbar region 10/08/2017  . Glaucoma 06/21/2016  .  Drug therapy 01/28/2016  . Benign hypertension with CKD (chronic kidney disease) stage III 01/16/2016  . Discoid lupus erythematosus 01/16/2016  .  Generalized anxiety disorder 01/16/2016  . Osteoporosis 01/16/2016  . Polymyalgia rheumatica (HCC) 01/16/2016  . Closed fracture of distal end of left radius 11/11/2014  . Dry eye syndrome 08/03/2012  . Other states following surgery of eye and adnexa 04/21/2012  . 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 Low-tension glaucoma, unspecified eye, stage unspecified 04/07/2012  . Pseudophakia of both eyes 04/07/2012   PCP:  Elspeth Cho., MD Pharmacy:   [facility] [phone] - [place], [place] - [address] [place] [address] Phone: [phone] Fax: [phone]  [facility] - [place], [place] - [address] [place] [address] Phone: [phone] Fax: [phone]     Social Determinants of Health (SDOH) Interventions    Readmission Risk Interventions No flowsheet data found.

## 2020-01-10 NOTE — Progress Notes (Signed)
ANTICOAGULATION CONSULT NOTE Pharmacy Consult:  Heparin Indication: atrial fibrillation  Allergies  Allergen Reactions  . Penicillins Hives and Swelling    Has patient had a PCN reaction causing immediate rash, facial/tongue/throat swelling, SOB or lightheadedness with hypotension: Yes Has patient had a PCN reaction causing severe rash involving mucus membranes or skin necrosis: No Has patient had a PCN reaction that required hospitalization: No Has patient had a PCN reaction occurring within the last 10 years: No If all of the above answers are "NO", then may proceed with Cephalosporin use.   . Brimonidine Other (See Comments)    Don't remember  . Other     Nuts causes blisters No spices coffee  . Penicillin G Other (See Comments)  . Timolol Maleate Other (See Comments)    Pt becomes faint  . Sulfa Antibiotics Rash  . Sulfasalazine Rash    Patient Measurements: Height: 5\' 3"  (160 cm) Weight: 44 kg (97 lb) IBW/kg (Calculated) : 52.4 Heparin Dosing Weight: 44 kg  Vital Signs: Temp: 98.2 F (36.8 C) (05/27 0809) Temp Source: Oral (05/27 0809) BP: 151/89 (05/27 0946) Pulse Rate: 73 (05/27 0809)  Labs: Recent Labs    01/09/20 1238 01/09/20 1520 01/10/20 0720 01/10/20 1320  HGB 14.4  --  14.3  --   HCT 44.7  --  44.1  --   PLT 151  --  146*  --   APTT  --   --  67* 66*  HEPARINUNFRC  --   --  1.04*  --   CREATININE 1.40*  --   --   --   TROPONINIHS 10 10  --   --     Estimated Creatinine Clearance: 20.4 mL/min (A) (by C-G formula based on SCr of 1.4 mg/dL (H)).   Medical History: Past Medical History:  Diagnosis Date  . Atrial fibrillation (HCC)   . Hypertension   . Immune system disorder (HCC)   . Polymyalgia (HCC)     Assessment: 70 YOF with history of Afib presented with thoracic pain.  Pharmacy consulted to transition from Eliquis to IV heparin, last Eliquis dose was 01/09/20.   -aPTT at the low end of goal  Goal of Therapy:  Heparin level 0.3-0.7  units/ml aPTT 66-102 seconds Monitor platelets by anticoagulation protocol: Yes   Plan:  Increase heparin to 650 units/hr Daily heparin level, aPTT and CBC  01/11/20, PharmD Clinical Pharmacist **Pharmacist phone directory can now be found on [email or web address] ([email or web address]).  Listed under [facility].

## 2020-01-10 NOTE — Evaluation (Signed)
Physical Therapy Evaluation Patient Details Name: Jodi Snyder MRN: [id number] DOB: 1932-03-27 Today's Date: 01/10/2020   History of Present Illness  Pt is an 86 y/o female admitted secondary to chest pain. PMH includes a fib, lupus, scoliosis, and polymyalgia rheumatica.   Clinical Impression  Pt admitted secondary to problem above with deficits below. Pt presenting with weakness, unsteadiness, and cognitive deficits. Pt with notable memory deficits and asking PT what PT was here fore multiple times. Requiring min to mod A for mobility tasks. Pt currently lives alone and feel she is a high fall risk. Feel at this time she will require ST SNF, however, may progress well. If family able to provide 24/7 assist, may be able to d/c home with HHPT. Will continue to follow acutely to maximize functional mobility independence and safety.     Follow Up Recommendations SNF;Supervision/Assistance - 24 hour(unless family/friends able to provide 24/7 support)    Equipment Recommendations  Rolling walker with 5" wheels    Recommendations for Other Services       Precautions / Restrictions Precautions Precautions: Fall Restrictions Weight Bearing Restrictions: No      Mobility  Bed Mobility Overal bed mobility: Needs Assistance Bed Mobility: Supine to Sit;Sit to Supine     Supine to sit: Supervision;HOB elevated Sit to supine: Supervision;HOB elevated   General bed mobility comments: Supervision for safety. Increased time required.   Transfers Overall transfer level: Needs assistance Equipment used: None Transfers: Sit to/from Stand Sit to Stand: Mod assist         General transfer comment: Mod A for steadying assist to stand. Pt with posterior lean, but able to correct with assist.   Ambulation/Gait Ambulation/Gait assistance: Min assist;Min guard Gait Distance (Feet): 50 Feet Assistive device: None;Rolling walker (2 wheeled) Gait Pattern/deviations: Step-through  pattern;Decreased stride length;Shuffle Gait velocity: Decreased   General Gait Details: Shuffle type gait without use of AD. Very unsteady requiring min A for steadying. Improved steadiness noted with RW, however, did continue to exhibit some unsteadiness. Reports some lightheadedness initially, however, then reports she did not feel lightheaded, just did not feel herself.   Stairs            Wheelchair Mobility    Modified Rankin (Stroke Patients Only)       Balance Overall balance assessment: Needs assistance Sitting-balance support: No upper extremity supported;Feet supported Sitting balance-Leahy Scale: Fair     Standing balance support: Bilateral upper extremity supported;During functional activity;No upper extremity supported Standing balance-Leahy Scale: Poor Standing balance comment: Reliant on UE and external support                             Pertinent Vitals/Pain Pain Assessment: No/denies pain    Home Living Family/patient expects to be discharged to:: Private residence Living Arrangements: Alone Available Help at Discharge: Friend(s);Available PRN/intermittently Type of Home: House Home Access: Level entry     Home Layout: One level Home Equipment: Cane - single point;Walker - 2 wheels      Prior Function Level of Independence: Independent               Hand Dominance        Extremity/Trunk Assessment   Upper Extremity Assessment Upper Extremity Assessment: Defer to OT evaluation    Lower Extremity Assessment Lower Extremity Assessment: Generalized weakness    Cervical / Trunk Assessment Cervical / Trunk Assessment: Other exceptions Cervical / Trunk Exceptions: scoliosis  Communication  Communication: No difficulties  Cognition Arousal/Alertness: Awake/alert Behavior During Therapy: WFL for tasks assessed/performed Overall Cognitive Status: Impaired/Different from baseline Area of Impairment: Memory;Following  commands;Problem solving;Safety/judgement                     Memory: Decreased short-term memory Following Commands: Follows one step commands with increased time Safety/Judgement: Decreased awareness of deficits   Problem Solving: Slow processing;Difficulty sequencing;Requires verbal cues;Requires tactile cues General Comments: Pt asking multiple times what PT was there to do. Pt also with difficulty sequencing. Reports she does not "feel herself"       General Comments      Exercises     Assessment/Plan    PT Assessment Patient needs continued PT services  PT Problem List Decreased strength;Decreased balance;Decreased activity tolerance;Decreased mobility;Decreased cognition;Decreased knowledge of use of DME;Decreased safety awareness;Decreased knowledge of precautions       PT Treatment Interventions Gait training;DME instruction;Functional mobility training;Therapeutic activities;Therapeutic exercise;Balance training;Patient/family education;Cognitive remediation    PT Goals (Current goals can be found in the Care Plan section)  Acute Rehab PT Goals Patient Stated Goal: to get some rest PT Goal Formulation: With patient Time For Goal Achievement: 01/24/20 Potential to Achieve Goals: Good    Frequency Min 3X/week   Barriers to discharge Decreased caregiver support      Co-evaluation               AM-PAC PT "6 Clicks" Mobility  Outcome Measure Help needed turning from your back to your side while in a flat bed without using bedrails?: None Help needed moving from lying on your back to sitting on the side of a flat bed without using bedrails?: A Little Help needed moving to and from a bed to a chair (including a wheelchair)?: A Little Help needed standing up from a chair using your arms (e.g., wheelchair or bedside chair)?: A Lot Help needed to walk in hospital room?: A Little Help needed climbing 3-5 steps with a railing? : A Lot 6 Click Score: 17     End of Session Equipment Utilized During Treatment: Gait belt Activity Tolerance: Patient limited by fatigue Patient left: in bed;with call bell/phone within reach;with bed alarm set Nurse Communication: Mobility status PT Visit Diagnosis: Unsteadiness on feet (R26.81);Muscle weakness (generalized) (M62.81)    Time: 2836-6294 PT Time Calculation (min) (ACUTE ONLY): 23 min   Charges:   PT Evaluation $PT Eval Moderate Complexity: 1 Mod PT Treatments $Gait Training: 8-22 mins        Lou Miner, DPT  Acute Rehabilitation Services  Pager: [phone] Office: [phone]   Rudean Hitt 01/10/2020, 11:22 AM

## 2020-01-10 NOTE — Consult Note (Signed)
Cardiology Consultation:   Patient ID: Jodi Snyder MRN: [id number]; DOB: 06-30-1932  Admit date: 01/09/2020 Date of Consult: 01/10/2020  Primary Care Provider: Elspeth Cho., MD Primary Cardiologist: Will Jorja Loa, MD  Primary Electrophysiologist:  None    Patient Profile:   Jodi Snyder is a 86 y.o. female with a hx of HTN, PAF on eliquis, chronic diastolic heart failure, scoliosis, polymaylgia rheumatica, and discoid lupus who is being seen today for the evaluation of chest pain at the request of Dr. Alanda Slim.  History of Present Illness:   Jodi Snyder was evaluated in the ER 01/15/17 with new onset Afib RVR with successful DCCV. She followed up in Afib clinic and with Dr. Elberta Fortis. She is chronically anticoagulated with 2.5 mg eliquis for a This patients CHA2DS2-VASc Score and unadjusted Ischemic Stroke Rate (% per year) is equal to 4.8 % stroke rate/year from a score of 4 (2age, HTN, HFpEF). She was also given PRN cardizem 30 mg to use for palpitations/breakthrough Afib. She was last seen by Dr. Elberta Fortis 10/30/19 following an ER visit for tachypalpitations found to have long RP SVT. Dr. Johney Frame consulted and suggested she likely had atrial tachycardia vs atypical flutter. She was started on cardizem CD 120 mg.   She presented to [facility] with chest pain. She was getting dressed and experienced onset of back and lateral chest pain. She was transferred to [facility] for ACS rule out.   EKG nonischemic.  HS troponin 10 --> 10 sCr 1.40, baseline 1.1-1.2.  Cardiology was consulted. On my interview, she reports sudden onset left lateral chest wall pain that radiated under her breast and around to her spine when putting on under-clothes. The pain was sharp/stabbing pain and was a constant 8/10 pain. She stopped dressing and went to the kitchen to wash dishes and the pain persisted. She checked per BP and reported that it was in the 60s systolic. She presented to [facility] for evaluation. The pain  is described as constant, but has improved over the last 24 hrs. She lives alone, husband passed away 5 years ago. She can no longer vacuum and does not walk outside due to sunlight and macular degeneration. She is concerned that her scoliosis is worsening and that this is causing her pain. The pain is much improved today, but still tender to palpation on exam. Pain is not worse with deep inspiration or with moving her left upper extremity.  She is not able to complete 4.0 METS at home, so its difficult to evaluate her functional capacity. She also states that she felt unwell after her second COVID vaccination in April - had dizziness and pressure elevated to 190s systolic during the 15 min observation period. She was not evaluated at that time and has not experienced elevated pressure since then. She does not have a history of CAD, MI, or stress test.    Past Medical History:  Diagnosis Date  . Atrial fibrillation (HCC)   . Hypertension   . Immune system disorder (HCC)   . Polymyalgia (HCC)     Past Surgical History:  Procedure Laterality Date  . ABDOMINAL HYSTERECTOMY    . BLADDER SURGERY    . OOPHORECTOMY       Home Medications:  Prior to Admission medications   Medication Sig Start Date End Date Taking? Authorizing Provider  ALPRAZolam Prudy Feeler) 0.5 MG tablet Take 0.25 mg by mouth 2 (two) times daily.  04/20/18  Yes [provider]  Biotin (BIOTIN 5000) 5 MG CAPS Take  5 mg by mouth daily.    Yes [provider]  brinzolamide (AZOPT) 1 % ophthalmic suspension Place 1 drop into both eyes 3 (three) times daily.    Yes [provider]  cycloSPORINE (RESTASIS) 0.05 % ophthalmic emulsion Place 1 drop into both eyes 2 (two) times daily.    Yes [provider]  diltiazem (CARDIZEM CD) 120 MG 24 hr capsule Take 1 capsule (120 mg total) by mouth daily. 10/31/19  Yes Camnitz, Will Daphine Deutscher, MD  diltiazem (CARDIZEM) 30 MG tablet Take 1-2 tablets every 4 hours AS NEEDED  for afib heart rate >100 Patient taking differently: Take 30-60 mg by mouth See admin instructions. Take 1-2 tablets (30-60 mg totally) by mouth every 4 hours AS NEEDED for afib heart rate >100 09/30/19  Yes Claudean Severance, MD  ELIQUIS 2.5 MG TABS tablet TAKE 1 TABLET BY MOUTH TWICE DAILY Patient taking differently: Take 2.5 mg by mouth 2 (two) times daily.  10/02/19  Yes Camnitz, Will Daphine Deutscher, MD  esomeprazole (NEXIUM) 40 MG capsule Take 40 mg by mouth daily.    Yes [provider]  ferrous sulfate 325 (65 FE) MG tablet Take 325 mg by mouth daily.   Yes [provider]  hydroxychloroquine (PLAQUENIL) 200 MG tablet Take 200 mg by mouth daily.   Yes [provider]  ketorolac (ACULAR) 0.4 % SOLN Apply 1 drop to eye 2 (two) times daily. 09/04/19  Yes [provider]  lisinopril (ZESTRIL) 10 MG tablet TAKE 1 TABLET(10 MG) BY MOUTH DAILY Patient taking differently: Take 10 mg by mouth daily.  12/10/19  Yes Camnitz, Will Daphine Deutscher, MD  metroNIDAZOLE (METROCREAM) 0.75 % cream Apply 1 application topically daily. 08/08/19  Yes [provider]  Multiple Vitamins-Minerals (ICAPS AREDS 2 PO) Take 2 capsules by mouth 2 (two) times daily.   Yes [provider]  predniSONE (DELTASONE) 5 MG tablet Take 5 mg by mouth daily.  01/13/18  Yes [provider]  ROCKLATAN 0.02-0.005 % SOLN Apply 1 drop to eye at bedtime. 08/31/19  Yes [provider]  traMADol-acetaminophen (ULTRACET) 37.5-325 MG tablet Take 1 tablet by mouth 2 (two) times daily.    Yes [provider]    Inpatient Medications: Scheduled Meds: . ALPRAZolam  0.25 mg Oral BID  . brinzolamide  1 drop Both Eyes TID  . cycloSPORINE  1 drop Both Eyes BID  . diltiazem  120 mg Oral Daily  . ferrous sulfate  325 mg Oral Daily  . hydroxychloroquine  200 mg Oral Daily  . ketorolac  1 drop Both Eyes BID  . lisinopril  10 mg Oral Daily  . metroNIDAZOLE   Topical Daily  .  Netarsudil-Latanoprost  1 drop Ophthalmic QHS  . pantoprazole  40 mg Oral Daily  . predniSONE  5 mg Oral QAC breakfast  . traMADol-acetaminophen  1 tablet Oral BID   Continuous Infusions: . heparin 600 Units/hr (01/09/20 2334)   PRN Meds: acetaminophen, ondansetron (ZOFRAN) IV  Allergies:    Allergies  Allergen Reactions  . Penicillins Hives and Swelling    Has patient had a PCN reaction causing immediate rash, facial/tongue/throat swelling, SOB or lightheadedness with hypotension: Yes Has patient had a PCN reaction causing severe rash involving mucus membranes or skin necrosis: No Has patient had a PCN reaction that required hospitalization: No Has patient had a PCN reaction occurring within the last 10 years: No If all of the above answers are "NO", then may proceed with Cephalosporin  use.   . Brimonidine Other (See Comments)    Don't remember  . Other     Nuts causes blisters No spices coffee  . Penicillin G Other (See Comments)  . Timolol Maleate Other (See Comments)    Pt becomes faint  . Sulfa Antibiotics Rash  . Sulfasalazine Rash    Social History:   Social History   Socioeconomic History  . Marital status: Widowed    Spouse name: Not on file  . Number of children: Not on file  . Years of education: Not on file  . Highest education level: Not on file  Occupational History  . Not on file  Tobacco Use  . Smoking status: Former Games developer  . Smokeless tobacco: Never Used  Substance and Sexual Activity  . Alcohol use: No  . Drug use: No  . Sexual activity: Not on file  Other Topics Concern  . Not on file  Social History Narrative  . Not on file   Social Determinants of Health   Financial Resource Strain:   . Difficulty of Paying Living Expenses:   Food Insecurity:   . Worried About Programme researcher, broadcasting/film/video in the Last Year:   . Barista in the Last Year:   Transportation Needs:   . Freight forwarder (Medical):   Marland Kitchen Lack of Transportation  (Non-Medical):   Physical Activity:   . Days of Exercise per Week:   . Minutes of Exercise per Session:   Stress:   . Feeling of Stress :   Social Connections:   . Frequency of Communication with Friends and Family:   . Frequency of Social Gatherings with Friends and Family:   . Attends Religious Services:   . Active Member of Clubs or Organizations:   . Attends Banker Meetings:   Marland Kitchen Marital Status:   Intimate Partner Violence:   . Fear of Current or Ex-Partner:   . Emotionally Abused:   Marland Kitchen Physically Abused:   . Sexually Abused:     Family History:    Family History  Problem Relation Age of Onset  . Heart attack Mother   . Stroke Father   . Anuerysm Father      ROS:  Please see the history of present illness.   All other ROS reviewed and negative.     Physical Exam/Data:   Vitals:   01/10/20 0009 01/10/20 0542 01/10/20 0809 01/10/20 0946  BP: (!) 142/75 (!) 148/97 (!) 150/75 (!) 151/89  Pulse: (!) 56 63 73   Resp: 18 20 16    Temp: 98.6 F (37 C) 98.3 F (36.8 C) 98.2 F (36.8 C)   TempSrc: Oral Oral Oral   SpO2: 100% 99% 99%   Weight:  44 kg    Height:        Intake/Output Summary (Last 24 hours) at 01/10/2020 1358 Last data filed at 01/10/2020 0300 Gross per 24 hour  Intake 20.09 ml  Output --  Net 20.09 ml   Last 3 Weights 01/10/2020 01/09/2020 01/09/2020  Weight (lbs) 97 lb 98 lb 12.8 oz 100 lb  Weight (kg) 43.999 kg 44.815 kg 45.36 kg     Body mass index is 17.18 kg/m.  General:  Elderly female, appears frail HEENT: normal Neck: no JVD Vascular: No carotid bruits Cardiac:  normal S1, S2; RRR; no murmur Lungs:  clear to auscultation bilaterally, no wheezing, rhonchi or rales  Abd: soft, nontender, no hepatomegaly  Ext: no edema Musculoskeletal:  No deformities,  BUE and BLE strength normal and equal Skin: warm and dry  Neuro:  CNs 2-12 intact, no focal abnormalities noted Psych:  Normal affect   EKG:  The EKG was personally  reviewed and demonstrates:  Sinus rhythm with HR 68 Telemetry:  Telemetry was personally reviewed and demonstrates:  Sinus rhythm 60-80s  Relevant CV Studies:  Echo 02/09/17: Study Conclusions  - Left ventricle: The cavity size was normal. Wall thickness was  normal. Systolic function was normal. The estimated ejection  fraction was in the range of 60% to 65%. Wall motion was normal;  there were no regional wall motion abnormalities. Features are  consistent with a pseudonormal left ventricular filling pattern,  with concomitant abnormal relaxation and increased filling  pressure (grade 2 diastolic dysfunction).  - Aortic valve: Mildly calcified annulus.  - Mitral valve: There was mild regurgitation.  - Pulmonary arteries: Systolic pressure was mildly increased. PA  peak pressure: 32 mm Hg (S).   Laboratory Data:  High Sensitivity Troponin:   Recent Labs  Lab 01/09/20 1238 01/09/20 1520  TROPONINIHS 10 10     Chemistry Recent Labs  Lab 01/09/20 1238  NA 140  K 4.6  CL 102  CO2 26  GLUCOSE 99  BUN 30*  CREATININE 1.40*  CALCIUM 9.2  GFRNONAA 34*  GFRAA 40*  ANIONGAP 12    No results for input(s): PROT, ALBUMIN, AST, ALT, ALKPHOS, BILITOT in the last 168 hours. Hematology Recent Labs  Lab 01/09/20 1238 01/10/20 0720  WBC 6.2 6.1  RBC 4.40 4.41  HGB 14.4 14.3  HCT 44.7 44.1  MCV 101.6* 100.0  MCH 32.7 32.4  MCHC 32.2 32.4  RDW 13.2 13.0  PLT 151 146*   BNPNo results for input(s): BNP, PROBNP in the last 168 hours.  DDimer  Recent Labs  Lab 01/09/20 1238  DDIMER <0.27     Radiology/Studies:  DG Chest 2 View  Result Date: 01/09/2020 CLINICAL DATA:  Mid left chest pain since last night. EXAM: CHEST - 2 VIEW COMPARISON:  09/29/2019 FINDINGS: Normal sized heart. Mildly tortuous and partially calcified thoracic aorta. The lungs remain hyperexpanded with a stable small calcified granuloma at the right lung base. Biapical pleural and  parenchymal scarring has not changed significantly, including small areas of calcification. Interval possible calcified pleural plaque underneath an EKG lead at the superior aspect of the left lung. Diffuse osteopenia. Mild to moderate scoliosis. IMPRESSION: 1. No acute abnormality. 2. Stable changes of COPD. 3. Interval possible calcified pleural plaque underneath an EKG lead at the superior aspect of the left lung. Electronically Signed   By: Beckie SaltsSteven  Reid M.D.   On: 01/09/2020 12:46   DG Ribs Unilateral Left  Result Date: 01/09/2020 CLINICAL DATA:  Left-sided rib pain EXAM: LEFT RIBS - 2 VIEW COMPARISON:  Chest x-ray 01/09/2020 FINDINGS: No fracture or other bone lesions are seen involving the ribs. IMPRESSION: Negative. Electronically Signed   By: Jasmine PangKim  Fujinaga M.D.   On: 01/09/2020 22:45   MR THORACIC SPINE WO CONTRAST  Result Date: 01/09/2020 CLINICAL DATA:  Mid back pain EXAM: MRI THORACIC SPINE WITHOUT CONTRAST TECHNIQUE: Multiplanar, multisequence MR imaging of the thoracic spine was performed. No intravenous contrast was administered. COMPARISON:  None. FINDINGS: Alignment:  Right convex scoliosis with apex at T11 Vertebrae: No fracture, evidence of discitis, or bone lesion. Cord: No focal disc herniation. No spinal canal or neural foraminal stenosis. Paraspinal and other soft tissues: Biapical opacities, likely scarring. Disc levels: No spinal canal or neural  foraminal stenosis. IMPRESSION: 1. No acute abnormality of the thoracic spine. 2. Right convex scoliosis with apex at T11. 3. No spinal canal or neural foraminal stenosis. Electronically Signed   By: Ulyses Jarred M.D.   On: 01/09/2020 22:53       HEAR Score (for undifferentiated chest pain):  HEAR Score: 2    Assessment and Plan:   1. Chest pain - hs troponin 10 --> 10 - EKG sinus rhythm HR 68, nonischemic - her description of sudden onset chest pain while dressing sounds atypical for ACS  - recommend obtaining echocardiogram -->  if normal, may be discharged with close outpatient follow up to determine of additional ischemic evaluation is warranted   2. PAF 3. Hx of tachypalpitations 4. Chronic anticoagulation - continue reduced dose eliquis (age and weight) - continue cardziem 120 mg  - sinus rhythm on telemetry   5. Hypertension - continue ACEI   6. Chronic diastolic heart failure - appears euvolemic on exam, no lower extremity swelling, lungs CTA       For questions or updates, please contact [place] Please consult [email or web address] for contact info under     Signed, Jenkins Rouge, MD  01/10/2020 1:58 PM   Patient examined chart reviewed Discussed care with patient and PA. Exam with frail elderly female lungs clear no murmur no bruit some pain to palpation over left flank/ribs. Abdomen soft no edema palpable PT/DP CXR scoliosis no rib fractures troponin negative ECG normal no acute changes. Hold eliquis cover with heparin Lexiscan myovue in am . Telemetry with NSR no PAF No indication for cath at this time Possible d/c in am if myovue low risk / non ischemic  Jenkins Rouge MD [facility]

## 2020-01-10 NOTE — Care Management (Signed)
Assisted  Patient to bathroom had a large  amount of urine.  Shuffling with steps, needs one assist.  Noticed rash on back and abdomen. Patient states she has had this for years and sees a dermatologist. Reported to RN.

## 2020-01-10 NOTE — Care Management Obs Status (Signed)
MEDICARE OBSERVATION STATUS NOTIFICATION   Patient Details  Name: Jodi Snyder MRN: [id number] Date of Birth: [birth year]   Medicare Observation Status Notification Given:  Yes    Lockie Pares, RN 01/10/2020, 9:49 AM

## 2020-01-11 ENCOUNTER — Observation Stay (HOSPITAL_COMMUNITY): Payer: Medicare PPO

## 2020-01-11 DIAGNOSIS — M546 Pain in thoracic spine: Secondary | ICD-10-CM | POA: Diagnosis not present

## 2020-01-11 DIAGNOSIS — R079 Chest pain, unspecified: Secondary | ICD-10-CM

## 2020-01-11 DIAGNOSIS — R5381 Other malaise: Secondary | ICD-10-CM | POA: Diagnosis not present

## 2020-01-11 DIAGNOSIS — E43 Unspecified severe protein-calorie malnutrition: Secondary | ICD-10-CM

## 2020-01-11 DIAGNOSIS — R531 Weakness: Secondary | ICD-10-CM | POA: Diagnosis not present

## 2020-01-11 DIAGNOSIS — I130 Hypertensive heart and chronic kidney disease with heart failure and stage 1 through stage 4 chronic kidney disease, or unspecified chronic kidney disease: Secondary | ICD-10-CM | POA: Diagnosis present

## 2020-01-11 DIAGNOSIS — N1831 Chronic kidney disease, stage 3a: Secondary | ICD-10-CM | POA: Diagnosis present

## 2020-01-11 DIAGNOSIS — R0789 Other chest pain: Secondary | ICD-10-CM | POA: Diagnosis present

## 2020-01-11 DIAGNOSIS — Z7901 Long term (current) use of anticoagulants: Secondary | ICD-10-CM | POA: Diagnosis not present

## 2020-01-11 DIAGNOSIS — I5032 Chronic diastolic (congestive) heart failure: Secondary | ICD-10-CM | POA: Diagnosis present

## 2020-01-11 DIAGNOSIS — R627 Adult failure to thrive: Secondary | ICD-10-CM

## 2020-01-11 DIAGNOSIS — I480 Paroxysmal atrial fibrillation: Secondary | ICD-10-CM | POA: Diagnosis present

## 2020-01-11 DIAGNOSIS — H3530 Unspecified macular degeneration: Secondary | ICD-10-CM | POA: Diagnosis present

## 2020-01-11 DIAGNOSIS — H401290 Low-tension glaucoma, unspecified eye, stage unspecified: Secondary | ICD-10-CM | POA: Diagnosis present

## 2020-01-11 DIAGNOSIS — M4135 Thoracogenic scoliosis, thoracolumbar region: Secondary | ICD-10-CM | POA: Diagnosis present

## 2020-01-11 DIAGNOSIS — Z681 Body mass index (BMI) 19 or less, adult: Secondary | ICD-10-CM | POA: Diagnosis not present

## 2020-01-11 DIAGNOSIS — Z79899 Other long term (current) drug therapy: Secondary | ICD-10-CM | POA: Diagnosis not present

## 2020-01-11 DIAGNOSIS — N179 Acute kidney failure, unspecified: Secondary | ICD-10-CM | POA: Diagnosis present

## 2020-01-11 DIAGNOSIS — Z7952 Long term (current) use of systemic steroids: Secondary | ICD-10-CM | POA: Diagnosis not present

## 2020-01-11 DIAGNOSIS — F419 Anxiety disorder, unspecified: Secondary | ICD-10-CM | POA: Diagnosis present

## 2020-01-11 DIAGNOSIS — M353 Polymyalgia rheumatica: Secondary | ICD-10-CM | POA: Diagnosis present

## 2020-01-11 DIAGNOSIS — R54 Age-related physical debility: Secondary | ICD-10-CM | POA: Diagnosis present

## 2020-01-11 DIAGNOSIS — M810 Age-related osteoporosis without current pathological fracture: Secondary | ICD-10-CM | POA: Diagnosis present

## 2020-01-11 DIAGNOSIS — Z20822 Contact with and (suspected) exposure to covid-19: Secondary | ICD-10-CM | POA: Diagnosis present

## 2020-01-11 DIAGNOSIS — Z66 Do not resuscitate: Secondary | ICD-10-CM | POA: Diagnosis present

## 2020-01-11 DIAGNOSIS — G4700 Insomnia, unspecified: Secondary | ICD-10-CM | POA: Diagnosis present

## 2020-01-11 DIAGNOSIS — L930 Discoid lupus erythematosus: Secondary | ICD-10-CM | POA: Diagnosis present

## 2020-01-11 DIAGNOSIS — G8929 Other chronic pain: Secondary | ICD-10-CM | POA: Diagnosis present

## 2020-01-11 LAB — CBC
HCT: 41.4 % (ref 36.0–46.0)
Hemoglobin: 13.4 g/dL (ref 12.0–15.0)
MCH: 32.6 pg (ref 26.0–34.0)
MCHC: 32.4 g/dL (ref 30.0–36.0)
MCV: 100.7 fL — ABNORMAL HIGH (ref 80.0–100.0)
Platelets: 147 10*3/uL — ABNORMAL LOW (ref 150–400)
RBC: 4.11 MIL/uL (ref 3.87–5.11)
RDW: 13.1 % (ref 11.5–15.5)
WBC: 5.1 10*3/uL (ref 4.0–10.5)
nRBC: 0 % (ref 0.0–0.2)

## 2020-01-11 LAB — MAGNESIUM: Magnesium: 1.9 mg/dL (ref 1.7–2.4)

## 2020-01-11 LAB — RENAL FUNCTION PANEL
Albumin: 3.5 g/dL (ref 3.5–5.0)
Anion gap: 8 (ref 5–15)
BUN: 25 mg/dL — ABNORMAL HIGH (ref 8–23)
CO2: 25 mmol/L (ref 22–32)
Calcium: 8.9 mg/dL (ref 8.9–10.3)
Chloride: 109 mmol/L (ref 98–111)
Creatinine, Ser: 1.21 mg/dL — ABNORMAL HIGH (ref 0.44–1.00)
GFR calc Af Amer: 47 mL/min — ABNORMAL LOW (ref >60)
GFR calc non Af Amer: 41 mL/min — ABNORMAL LOW (ref >60)
Glucose, Bld: 87 mg/dL (ref 70–99)
Phosphorus: 3.8 mg/dL (ref 2.5–4.6)
Potassium: 3.6 mmol/L (ref 3.5–5.1)
Sodium: 142 mmol/L (ref 135–145)

## 2020-01-11 LAB — NM MYOCAR MULTI W/SPECT W/WALL MOTION / EF
Estimated workload: 1 METS
Exercise duration (min): 0 min
Exercise duration (sec): 0 s
MPHR: 135 {beats}/min
Peak HR: 82 {beats}/min
Percent HR: 60 %
Rest HR: 56 {beats}/min

## 2020-01-11 LAB — APTT: aPTT: 59 seconds — ABNORMAL HIGH (ref 24–36)

## 2020-01-11 LAB — HEPARIN LEVEL (UNFRACTIONATED): Heparin Unfractionated: 0.98 IU/mL — ABNORMAL HIGH (ref 0.30–0.70)

## 2020-01-11 MED ORDER — REGADENOSON 0.4 MG/5ML IV SOLN: 0.4000 mg | Freq: Once | INTRAVENOUS | Status: AC

## 2020-01-11 MED ORDER — APIXABAN 2.5 MG PO TABS
2.5000 mg | ORAL_TABLET | Freq: Two times a day (BID) | ORAL | Status: DC
  Administered 2020-01-11 – 2020-01-14 (×6): 2.5 mg via ORAL
  Filled 2020-01-11 (×6): qty 1

## 2020-01-11 MED ORDER — TECHNETIUM TC 99M TETROFOSMIN IV KIT
10.0000 | PACK | Freq: Once | INTRAVENOUS | Status: AC | PRN
  Administered 2020-01-11: 10 via INTRAVENOUS

## 2020-01-11 MED ORDER — REGADENOSON 0.4 MG/5ML IV SOLN
INTRAVENOUS | Status: AC
  Administered 2020-01-11: 0.4 mg via INTRAVENOUS
  Filled 2020-01-11: qty 5

## 2020-01-11 NOTE — TOC Progression Note (Signed)
Transition of Care [facility]) - Progression Note    Patient Details  Name: Jodi Snyder MRN: [id number] Date of Birth: Sep 02, 1931  [facility] CM/SW Contact  Terrial Rhodes, LCSWA Phone Number: 01/11/2020, 3:51 PM  Clinical Narrative:     CSW faxed over clinicals for Insurance authorization. Reference number is [id number]. The start date for authorization is Saturday 01/12/20.  Pending bed offers, Pending insurance authorization.  TOC team will continue to follow.  Expected Discharge Plan: Skilled Nursing Facility Barriers to Discharge: Continued Medical Work up  Expected Discharge Plan and Services Expected Discharge Plan: Skilled Nursing Facility       Living arrangements for the past 2 months: Single Family Home                                       Social Determinants of Health (SDOH) Interventions    Readmission Risk Interventions No flowsheet data found.

## 2020-01-11 NOTE — Progress Notes (Signed)
   Patient presented for a nuclear stress test today. No immediate complications. Stress imaging is pending at this time.   Preliminary EKG findings may be listed in the chart, but the stress test result will not be finalized until perfusion imaging is complete.  Corrin Parker, PA-C 01/11/2020 10:03 AM

## 2020-01-11 NOTE — Progress Notes (Signed)
OT Cancellation Note  Patient Details Name: Jodi Snyder MRN: [id number] DOB: March 30, 1932   Cancelled Treatment:     Pt currently off unit. Per Nursing, Pt in stress test secondary to back pain. Will follow up with pt this afternoon for Evaluation as able. Thank you.  Mechele Claude 01/11/2020, 9:16 AM

## 2020-01-11 NOTE — Progress Notes (Signed)
PROGRESS NOTE  Jodi Snyder TDH:[id number] DOB: July 15, 1932   PCP: Elspeth Cho., MD  Patient is from: Home.  Lives alone.  Ambulates using walker at baseline.  DOA: 01/09/2020 LOS: 0  Brief Narrative / Interim history: 86 year old female with history of A. fib, HTN, PMR and discoid lupus who presented to [facility] ED with chest pain, and admitted for ACS rule out.  In ED, CXR without acute finding.  EKG with mild ST changes laterally but improved on repeat.  D-dimer and high-sensitivity troponin negative.  Admitted for ACS rule out.  Cardiology consulted.  She also had MRI of the spine ordered.   The next day, chest pain improved significantly.  MRI without acute finding.  Cardiology recommended Myoview which was reported as low risk.   Therapy recommended SNF.   Subjective: Seen and examined earlier this afternoon after she returned from stress test.  Reports some vague feeling over his upper back and neck but denies pain.  Denies chest pain, dyspnea, GI or UTI symptoms.  Objective: Vitals:   01/11/20 0950 01/11/20 0952 01/11/20 0954 01/11/20 1631  BP: (!) 177/67 (!) 158/72 (!) 156/76 134/73  Pulse:    (!) 58  Resp:    19  Temp:    98.2 F (36.8 C)  TempSrc:    Oral  SpO2:    96%  Weight:      Height:       No intake or output data in the 24 hours ending 01/11/20 1652 Filed Weights   01/09/20 1823 01/10/20 0542 01/11/20 0331  Weight: 44.8 kg 44 kg 43.5 kg    Examination:  GENERAL: Frail elderly female.  No apparent distress.  Nontoxic. HEENT: MMM.  Vision and hearing grossly intact.  NECK: Supple.  No apparent JVD.  RESP: On room air.  No IWOB.  Fair aeration bilaterally. CVS:  RRR. Heart sounds normal.  ABD/GI/GU: BS+. Abd soft, NTND.  MSK/EXT:  Moves extremities.  Scoliosis.  No tenderness over spinous process or paraspinal muscles. SKIN: no apparent skin lesion or wound NEURO: Awake, alert and oriented appropriately.  No apparent focal neuro deficit. PSYCH:  Calm. Normal affect.  Procedures:  5/28-low risk nuclear stress test  Microbiology summarized: COVID-19 PCR negative.  Assessment & Plan: Atypical chest pain: Initial EKG with mild ST changes in lateral leads that has improved on repeat EKG.  Serial troponin negative.  Chest pain basically resolved.  Nuclear stress test negative.  Echocardiogram reassuring.  Cardiology signed off.  Paroxysmal A. Fib: Rate controlled. -Continue Cardizem -Stop heparin.  Resume Eliquis.  History of PMR and discoid lupus- -continue home prednisone and Plaquenil  Essential hypertension: Normotensive. -Continue home lisinopril and Cardizem -As needed hydralazine  AKI on CKD-3A: Baseline Cr~1.0-1.1> 1.4 (admit)> 1.21 -Continue monitoring -Avoid nephrotoxic meds  Anxiety/chronic pain/scoliosis -Continue home Xanax, tramadol and Tylenol  Debility/physical deconditioning: At baseline reports using walker for ambulation. Therapy recommended SNF for 24-hour supervision.  Patient lives alone to be discharged home safely.  The closest family member is a step son, Arlys John who occasionally visits. -Waiting on SNF.          DVT prophylaxis: On heparin drip for A. fib Code Status: DNR/DNI Family Communication: Attempted to call patient's [facility] [place] for update but no answer. Status is: Observation   Dispo: The patient is from: Home              Anticipated d/c is to: SNF  Anticipated d/c date is: 2 days              Patient currently is medically stable to d/c.       Consultants:  Cardiology-signed off.   Sch Meds:  Scheduled Meds: . ALPRAZolam  0.25 mg Oral BID  . apixaban  2.5 mg Oral BID  . brinzolamide  1 drop Both Eyes TID  . cycloSPORINE  1 drop Both Eyes BID  . diltiazem  120 mg Oral Daily  . ferrous sulfate  325 mg Oral Daily  . hydroxychloroquine  200 mg Oral Daily  . ketorolac  1 drop Both Eyes BID  . lisinopril  10 mg Oral Daily  . metroNIDAZOLE   Topical Daily   . Netarsudil-Latanoprost  1 drop Ophthalmic QHS  . pantoprazole  40 mg Oral Daily  . predniSONE  5 mg Oral QAC breakfast  . traMADol-acetaminophen  1 tablet Oral BID   Continuous Infusions:  PRN Meds:.acetaminophen, ondansetron (ZOFRAN) IV, polyethylene glycol, senna-docusate  Antimicrobials: Anti-infectives (From admission, onward)   Start     Dose/Rate Route Frequency Ordered Stop   01/10/20 1000  hydroxychloroquine (PLAQUENIL) tablet 200 mg     200 mg Oral Daily 01/09/20 2217         I have personally reviewed the following labs and images: CBC: Recent Labs  Lab 01/09/20 1238 01/10/20 0720 01/11/20 0331  WBC 6.2 6.1 5.1  NEUTROABS 5.2  --   --   HGB 14.4 14.3 13.4  HCT 44.7 44.1 41.4  MCV 101.6* 100.0 100.7*  PLT 151 146* 147*   BMP &GFR Recent Labs  Lab 01/09/20 1238 01/11/20 0331  NA 140 142  K 4.6 3.6  CL 102 109  CO2 26 25  GLUCOSE 99 87  BUN 30* 25*  CREATININE 1.40* 1.21*  CALCIUM 9.2 8.9  MG  --  1.9  PHOS  --  3.8   Estimated Creatinine Clearance: 23.3 mL/min (A) (by C-G formula based on SCr of 1.21 mg/dL (H)). Liver & Pancreas: Recent Labs  Lab 01/11/20 0331  ALBUMIN 3.5   No results for input(s): LIPASE, AMYLASE in the last 168 hours. No results for input(s): AMMONIA in the last 168 hours. Diabetic: No results for input(s): HGBA1C in the last 72 hours. No results for input(s): GLUCAP in the last 168 hours. Cardiac Enzymes: No results for input(s): CKTOTAL, CKMB, CKMBINDEX, TROPONINI in the last 168 hours. No results for input(s): PROBNP in the last 8760 hours. Coagulation Profile: No results for input(s): INR, PROTIME in the last 168 hours. Thyroid Function Tests: No results for input(s): TSH, T4TOTAL, FREET4, T3FREE, THYROIDAB in the last 72 hours. Lipid Profile: No results for input(s): CHOL, HDL, LDLCALC, TRIG, CHOLHDL, LDLDIRECT in the last 72 hours. Anemia Panel: No results for input(s): VITAMINB12, FOLATE, FERRITIN, TIBC,  IRON, RETICCTPCT in the last 72 hours. Urine analysis:    Component Value Date/Time   COLORURINE STRAW (A) 08/19/2018 0838   APPEARANCEUR CLEAR 08/19/2018 0838   LABSPEC 1.005 08/19/2018 0838   PHURINE 9.0 (H) 08/19/2018 0838   GLUCOSEU NEGATIVE 08/19/2018 0838   HGBUR NEGATIVE 08/19/2018 0838   BILIRUBINUR NEGATIVE 08/19/2018 0838   KETONESUR NEGATIVE 08/19/2018 0838   PROTEINUR NEGATIVE 08/19/2018 0838   NITRITE NEGATIVE 08/19/2018 0838   LEUKOCYTESUR NEGATIVE 08/19/2018 0838   Sepsis Labs: Invalid input(s): PROCALCITONIN, [place]  Microbiology: Recent Results (from the past 240 hour(s))  SARS Coronavirus 2 by RT PCR (hospital order, performed in [facility] hospital lab)  Nasopharyngeal Nasopharyngeal Swab     Status: None   Collection Time: 01/09/20  2:53 PM   Specimen: Nasopharyngeal Swab  Result Value Ref Range Status   SARS Coronavirus 2 NEGATIVE NEGATIVE Final    Comment: (NOTE) SARS-CoV-2 target nucleic acids are NOT DETECTED. The SARS-CoV-2 RNA is generally detectable in upper and lower respiratory specimens during the acute phase of infection. The lowest concentration of SARS-CoV-2 viral copies this assay can detect is 250 copies / mL. A negative result does not preclude SARS-CoV-2 infection and should not be used as the sole basis for treatment or other patient management decisions.  A negative result may occur with improper specimen collection / handling, submission of specimen other than nasopharyngeal swab, presence of viral mutation(s) within the areas targeted by this assay, and inadequate number of viral copies (<250 copies / mL). A negative result must be combined with clinical observations, patient history, and epidemiological information. Fact Sheet for Patients:   BoilerBrush.com.cy Fact Sheet for Healthcare Providers: https://pope.com/ This test is not yet approved or cleared  by the [place] FDA  and has been authorized for detection and/or diagnosis of SARS-CoV-2 by FDA under an Emergency Use Authorization (EUA).  This EUA will remain in effect (meaning this test can be used) for the duration of the COVID-19 declaration under Section 564(b)(1) of the Act, 21 U.S.C. section 360bbb-3(b)(1), unless the authorization is terminated or revoked sooner. Performed at [facility], [address] [place], [place] [address]     Radiology Studies: NM Myocar Multi W/Spect Izetta Dakin Motion / EF  Result Date: 01/11/2020  There was no ST segment deviation noted during stress.  Nuclear stress EF: 65%. The left ventricular ejection fraction is normal (55-65%).  This is a low risk study. No evidence of ischemia or previous infarction  The study is normal.      Taye T. Gonfa [facility]  If 7PM-7AM, please contact night-coverage [email or web address] Password [facility] 01/11/2020, 4:52 PM

## 2020-01-11 NOTE — Progress Notes (Signed)
Occupational Therapy Evaluation Patient Details Name: Jodi Snyder MRN: [id number] DOB: April 13, 1932 Today's Date: 01/11/2020    History of Present Illness Pt is an 86 y/o female admitted secondary to chest pain. PMH includes a fib, lupus, scoliosis, and polymyalgia rheumatica.    Clinical Impression   Prior to hospitalization, pt was Mod I for ADLs and functional mobility, primarily using standard point cane for ambulation throughout her house. Pt reports having a close friend who assists her with IADLs including driving to appointments and shopping. Pt reports cooking Independently but considering hiring a housekeeper for cleaning going forward. Pt admitted for above and limited by decreased balance, increased pain in neck/back, and decreased safety awareness. Today pt agreeable to OT before eating lunch. Pt c/o neck/back pain with functional movement. Will provide back/neck exercises in next tx session. Pt requires supervision for bed mobility using extended time and bed railing. Pt performed all functional transfers with supervision using RW for support and v/c's for safe RW management. Pt demonstrated poor RW safety awareness, placing RW beside her or out in front of her when participating in BADLs. Pt requires setup for UB dressing, eating, and grooming. Pt requires supervision for toileting, independent for toileting hygiene. Pt would benefit from skilled OT services to address functional transfers, ADLs, and safety awareness. Recommending SNF, unless 24/7 supervision is available than HHOT. Will continue to follow pt acutely as able.      Follow Up Recommendations  SNF;Other (comment)(Would prefer HHOT if pt has 24/7 supervision available)    Equipment Recommendations  Other (comment)(grab bars in bathroom)    Recommendations for Other Services       Precautions / Restrictions Precautions Precautions: Fall Restrictions Weight Bearing Restrictions: No      Mobility Bed  Mobility Overal bed mobility: Needs Assistance Bed Mobility: Supine to Sit     Supine to sit: Supervision;HOB elevated     General bed mobility comments: Supervision for safety. Increased time required.   Transfers Overall transfer level: Needs assistance Equipment used: Rolling walker (2 wheeled) Transfers: Sit to/from Stand Sit to Stand: Min guard         General transfer comment: no LOB, stood without difficulty, utilized RW but didn't necessarily need to use it    Balance Overall balance assessment: Needs assistance Sitting-balance support: No upper extremity supported;Feet supported Sitting balance-Leahy Scale: Good     Standing balance support: Bilateral upper extremity supported;During functional activity;No upper extremity supported Standing balance-Leahy Scale: Fair Standing balance comment: comfortable with the RW support; however unsafe with managing                           ADL either performed or assessed with clinical judgement   ADL Overall ADL's : Needs assistance/impaired Eating/Feeding: Set up;Sitting   Grooming: Wash/dry hands;Supervision/safety;Standing Grooming Details (indicate cue type and reason): RW for support and v/c's for safe RW management Upper Body Bathing: Supervision/ safety;Sitting;Standing   Lower Body Bathing: Min guard;Sitting/lateral leans;Sit to/from stand   Upper Body Dressing : Set up;Sitting   Lower Body Dressing: Min guard;Sitting/lateral leans;Sit to/from stand Lower Body Dressing Details (indicate cue type and reason): RW for support Toilet Transfer: Supervision/safety;Ambulation;Regular Toilet;Grab bars;RW   Toileting- Clothing Manipulation and Hygiene: Independent;Sitting/lateral lean   Tub/ Shower Transfer: Supervision/safety;Ambulation;Shower seat;Grab bars;Rolling walker   Functional mobility during ADLs: Supervision/safety;Rolling walker General ADL Comments: pt requires supervision for functional  transfers and v/c's for RW management; Pt able to independently perform grooming standing  at sink and toileting hygiene in sitting; pt setup for eating in recliner; Pt requires min guard for donning/doffing hospital gown.     Vision Baseline Vision/History: Macular Degeneration;Wears glasses Wears Glasses: At all times Patient Visual Report: No change from baseline       Perception     Praxis      Pertinent Vitals/Pain Pain Assessment: Faces Faces Pain Scale: Hurts even more Pain Location: posterior neck Pain Descriptors / Indicators: Aching;Tender;Heaviness;Dull Pain Intervention(s): Monitored during session;Repositioned     Hand Dominance Right   Extremity/Trunk Assessment Upper Extremity Assessment Upper Extremity Assessment: Overall WFL for tasks assessed   Lower Extremity Assessment Lower Extremity Assessment: Defer to PT evaluation   Cervical / Trunk Assessment Cervical / Trunk Assessment: Other exceptions Cervical / Trunk Exceptions: scoliosis   Communication Communication Communication: No difficulties   Cognition Arousal/Alertness: Awake/alert Behavior During Therapy: WFL for tasks assessed/performed Overall Cognitive Status: Impaired/Different from baseline Area of Impairment: Attention;Safety/judgement;Problem solving                   Current Attention Level: Selective   Following Commands: Follows one step commands with increased time Safety/Judgement: Decreased awareness of safety;Decreased awareness of deficits   Problem Solving: Slow processing;Difficulty sequencing;Requires verbal cues;Requires tactile cues General Comments: Pt more aware of situation; agreeable to [facility] or SNF   General Comments  pt reports having HHPT in the past and felt like it was helpful    Exercises     Shoulder Instructions      Home Living Family/patient expects to be discharged to:: Private residence Living Arrangements: Alone Available Help at Discharge:  Family;Friend(s);Available PRN/intermittently Type of Home: House Home Access: Level entry     Home Layout: One level     Bathroom Shower/Tub: Producer, television/film/video: Standard Bathroom Accessibility: Yes   Home Equipment: Cane - quad;Cane - single point;Walker - 2 wheels;Other (comment)(had suction cup grab bar- hoping for reinstallment soon)   Additional Comments: educated pt on installing sturdy grab bars near toilet and shower for stability      Prior Functioning/Environment Level of Independence: Independent with assistive device(s)        Comments: pt reports primarily using a standard cane for functional mobility within her home; pt able to perform all BADLs independently and received intermittent assistance with IADLs from friend (driving and grocery shopping)        OT Problem List: Decreased strength;Decreased activity tolerance;Impaired balance (sitting and/or standing);Decreased safety awareness;Pain      OT Treatment/Interventions: Self-care/ADL training;Therapeutic exercise;Neuromuscular education;Energy conservation;DME and/or AE instruction;Therapeutic activities    OT Goals(Current goals can be found in the care plan section) Acute Rehab OT Goals Patient Stated Goal: to go home  OT Frequency: Min 2X/week   Barriers to D/C:            Co-evaluation              AM-PAC OT "6 Clicks" Daily Activity     Outcome Measure Help from another person eating meals?: None Help from another person taking care of personal grooming?: None Help from another person toileting, which includes using toliet, bedpan, or urinal?: A Little Help from another person bathing (including washing, rinsing, drying)?: A Little Help from another person to put on and taking off regular upper body clothing?: None Help from another person to put on and taking off regular lower body clothing?: A Little 6 Click Score: 21   End of Session Equipment Utilized During  Treatment: Gait belt;Rolling walker Nurse Communication: Mobility status  Activity Tolerance: Patient tolerated treatment well Patient left: in chair;with call bell/phone within reach;with chair alarm set  OT Visit Diagnosis: Unsteadiness on feet (R26.81);Muscle weakness (generalized) (M62.81);Pain Pain - Right/Left: (both) Pain - part of body: (back and neck)                Time: 1354-1440 OT Time Calculation (min): 46 min Charges:  OT General Charges $OT Visit: 1 Visit OT Evaluation $OT Eval Moderate Complexity: 1 Mod OT Treatments $Self Care/Home Management : 23-37 mins  Norris Cross, OTR/L [facility] [phone]  Mechele Claude 01/11/2020, 4:11 PM

## 2020-01-11 NOTE — NC FL2 (Signed)
[place] LEVEL OF CARE SCREENING TOOL     IDENTIFICATION  Patient Name: Jodi Snyder Birthdate: 29-Sep-1931 Sex: female Admission Date (Current Location): 01/09/2020  [facility] and [place] Number:  Herbalist and Address:  [facility], [place] [address], [place], [place] [address]      Provider Number: [id number]  Attending Physician Name and Address:  Mercy Riding, MD  Relative Name and Phone Number:  Aaron Edelman [phone]    Current Level of Care: Hospital Recommended Level of Care: [place] Prior Approval Number:    Date Approved/Denied: 01/11/20 PASRR Number: [id number]  Discharge Plan: SNF    Current Diagnoses: Patient Active Problem List   Diagnosis Date Noted  . Chest pain 01/09/2020  . Acute left-sided thoracic back pain   . Supraventricular tachycardia ([place]) 09/29/2019  . Hypokalemia   . 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[place]) 08/19/2018  . Asymptomatic microscopic hematuria 01/05/2018  . Paroxysmal atrial fibrillation ([place]) 01/05/2018  . Scoliosis 10/18/2017  . Thoracogenic scoliosis of thoracolumbar region 10/08/2017  . Glaucoma 06/21/2016  . Drug therapy 01/28/2016  . Benign hypertension with CKD (chronic kidney disease) stage III 01/16/2016  . Discoid lupus erythematosus 01/16/2016  . Generalized anxiety disorder 01/16/2016  . Osteoporosis 01/16/2016  . Polymyalgia rheumatica ([place]) 01/16/2016  . Closed fracture of distal end of left radius 11/11/2014  . Dry eye syndrome 08/03/2012  . Other states following surgery of eye and adnexa 04/21/2012  . 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 Low-tension glaucoma, unspecified eye, stage unspecified 04/07/2012  . Pseudophakia of both eyes 04/07/2012    Orientation RESPIRATION BLADDER Height & Weight     Self, Time, Situation, Place  Normal Incontinent Weight: 95 lb 14.4 oz (43.5 kg) Height:  5\' 3"  (160 cm)  BEHAVIORAL SYMPTOMS/MOOD NEUROLOGICAL  BOWEL NUTRITION STATUS      Continent Diet(See Discharge Summary)  AMBULATORY STATUS COMMUNICATION OF NEEDS Skin   Limited Assist Verbally Normal                       Personal Care Assistance Level of Assistance  Bathing, Feeding, Dressing Bathing Assistance: Limited assistance Feeding assistance: Independent Dressing Assistance: Limited assistance     Functional Limitations Info  Sight, Hearing, Speech Sight Info: Impaired Hearing Info: Adequate Speech Info: Adequate    SPECIAL CARE FACTORS FREQUENCY  PT (By licensed PT), OT (By licensed OT)     PT Frequency: 5x min weekly OT Frequency: 5x min weekly            Contractures Contractures Info: Not present    Additional Factors Info  Code Status, Allergies Code Status Info: DNR Allergies Info: Penicillins,Brimonidine,Nuts causes blisters No spices coffee,Penicillin G,Timolol Maleate,Sulfa Antibiotics,Sulfasalazine           Current Medications (01/11/2020):  This is the current hospital active medication list Current Facility-Administered Medications  Medication Dose Route Frequency Provider Last Rate Last Admin  . acetaminophen (TYLENOL) tablet 650 mg  650 mg Oral Q4H PRN Rise Patience, MD   650 mg at 01/10/20 0750  . ALPRAZolam Duanne Moron) tablet 0.25 mg  0.25 mg Oral BID Rise Patience, MD   0.25 mg at 01/11/20 1000  . brinzolamide (AZOPT) 1 % ophthalmic suspension 1 drop  1 drop Both Eyes TID Rise Patience, MD   1 drop at 01/11/20 1215  . cycloSPORINE (RESTASIS) 0.05 % ophthalmic emulsion 1 drop  1 drop Both Eyes BID Rise Patience,  MD   1 drop at 01/11/20 1230  . diltiazem (CARDIZEM CD) 24 hr capsule 120 mg  120 mg Oral Daily Eduard Clos, MD   120 mg at 01/11/20 1250  . ferrous sulfate tablet 325 mg  325 mg Oral Daily Eduard Clos, MD   325 mg at 01/11/20 1252  . heparin ADULT infusion 100 units/mL (25000 units/270mL sodium chloride 0.45%)  700 Units/hr Intravenous  Continuous Silvana Newness, RPH 7 mL/hr at 01/11/20 1302 700 Units/hr at 01/11/20 1302  . hydroxychloroquine (PLAQUENIL) tablet 200 mg  200 mg Oral Daily Eduard Clos, MD   200 mg at 01/11/20 1253  . ketorolac (ACULAR) 0.5 % ophthalmic solution 1 drop  1 drop Both Eyes BID Eduard Clos, MD   1 drop at 01/11/20 1256  . lisinopril (ZESTRIL) tablet 10 mg  10 mg Oral Daily Eduard Clos, MD   10 mg at 01/11/20 1252  . metroNIDAZOLE (METROGEL) 0.75 % gel   Topical Daily Eduard Clos, MD      . Netarsudil-Latanoprost 0.02-0.005 % SOLN 1 drop  1 drop Ophthalmic QHS Eduard Clos, MD   1 drop at 01/10/20 2238  . ondansetron (ZOFRAN) injection 4 mg  4 mg Intravenous Q6H PRN Eduard Clos, MD      . pantoprazole (PROTONIX) EC tablet 40 mg  40 mg Oral Daily Eduard Clos, MD   40 mg at 01/11/20 1251  . polyethylene glycol (MIRALAX / GLYCOLAX) packet 17 g  17 g Oral BID PRN Gonfa, Taye T, MD      . predniSONE (DELTASONE) tablet 5 mg  5 mg Oral QAC breakfast Eduard Clos, MD   5 mg at 01/11/20 1328  . senna-docusate (Senokot-S) tablet 1 tablet  1 tablet Oral BID PRN Almon Hercules, MD      . traMADol-acetaminophen (ULTRACET) 37.5-325 MG per tablet 1 tablet  1 tablet Oral BID Eduard Clos, MD   1 tablet at 01/11/20 1000     Discharge Medications: Please see discharge summary for a list of discharge medications.  Relevant Imaging Results:  Relevant Lab Results:   Additional Information SSN-[id number]  Terrial Rhodes, LCSWA

## 2020-01-11 NOTE — NC FL2 (Deleted)
[place] LEVEL OF CARE SCREENING TOOL     IDENTIFICATION  Patient Name: Jodi Snyder Birthdate: [birth year] Sex: female Admission Date (Current Location): 01/09/2020  [facility] and [place] Number:  Herbalist and Address:  [facility], [place] [address], [place], [place] [address]      Provider Number: [id number]  Attending Physician Name and Address:  Mercy Riding, MD  Relative Name and Phone Number:  Aaron Edelman [phone]    Current Level of Care: Hospital Recommended Level of Care: [place] Prior Approval Number:    Date Approved/Denied: 01/11/20 PASRR Number: [id number]  Discharge Plan: SNF    Current Diagnoses: Patient Active Problem List   Diagnosis Date Noted  . Chest pain 01/09/2020  . Acute left-sided thoracic back pain   . Supraventricular tachycardia ([place]) 09/29/2019  . Hypokalemia   . 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[place]) 08/19/2018  . Asymptomatic microscopic hematuria 01/05/2018  . Paroxysmal atrial fibrillation ([place]) 01/05/2018  . Scoliosis 10/18/2017  . Thoracogenic scoliosis of thoracolumbar region 10/08/2017  . Glaucoma 06/21/2016  . Drug therapy 01/28/2016  . Benign hypertension with CKD (chronic kidney disease) stage III 01/16/2016  . Discoid lupus erythematosus 01/16/2016  . Generalized anxiety disorder 01/16/2016  . Osteoporosis 01/16/2016  . Polymyalgia rheumatica ([place]) 01/16/2016  . Closed fracture of distal end of left radius 11/11/2014  . Dry eye syndrome 08/03/2012  . Other states following surgery of eye and adnexa 04/21/2012  . 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 Low-tension glaucoma, unspecified eye, stage unspecified 04/07/2012  . Pseudophakia of both eyes 04/07/2012    Orientation RESPIRATION BLADDER Height & Weight     Self, Time, Situation, Place  Normal Incontinent Weight: 95 lb 14.4 oz (43.5 kg) Height:  5\' 3"  (160 cm)  BEHAVIORAL SYMPTOMS/MOOD NEUROLOGICAL  BOWEL NUTRITION STATUS      Continent Diet(See Discharge Summary)  AMBULATORY STATUS COMMUNICATION OF NEEDS Skin   Limited Assist Verbally Normal                       Personal Care Assistance Level of Assistance  Bathing, Feeding, Dressing Bathing Assistance: Limited assistance Feeding assistance: Independent Dressing Assistance: Limited assistance     Functional Limitations Info  Sight, Hearing, Speech Sight Info: Impaired Hearing Info: Adequate Speech Info: Adequate    SPECIAL CARE FACTORS FREQUENCY  PT (By licensed PT), OT (By licensed OT)     PT Frequency: 5x min weekly OT Frequency: 5x min weekly            Contractures Contractures Info: Not present    Additional Factors Info  Code Status, Allergies Code Status Info: DNR Allergies Info: Penicillins,Brimonidine,Nuts causes blisters No spices coffee,Penicillin G,Timolol Maleate,Sulfa Antibiotics,Sulfasalazine           Current Medications (01/11/2020):  This is the current hospital active medication list Current Facility-Administered Medications  Medication Dose Route Frequency Provider Last Rate Last Admin  . acetaminophen (TYLENOL) tablet 650 mg  650 mg Oral Q4H PRN Rise Patience, MD   650 mg at 01/10/20 0750  . ALPRAZolam Duanne Moron) tablet 0.25 mg  0.25 mg Oral BID Rise Patience, MD   0.25 mg at 01/11/20 1000  . brinzolamide (AZOPT) 1 % ophthalmic suspension 1 drop  1 drop Both Eyes TID Rise Patience, MD   1 drop at 01/11/20 1215  . cycloSPORINE (RESTASIS) 0.05 % ophthalmic emulsion 1 drop  1 drop Both Eyes BID Rise Patience,  MD   1 drop at 01/11/20 1230  . diltiazem (CARDIZEM CD) 24 hr capsule 120 mg  120 mg Oral Daily Eduard Clos, MD   120 mg at 01/11/20 1250  . ferrous sulfate tablet 325 mg  325 mg Oral Daily Eduard Clos, MD   325 mg at 01/11/20 1252  . heparin ADULT infusion 100 units/mL (25000 units/2101mL sodium chloride 0.45%)  700 Units/hr Intravenous  Continuous Silvana Newness, RPH 7 mL/hr at 01/11/20 1302 700 Units/hr at 01/11/20 1302  . hydroxychloroquine (PLAQUENIL) tablet 200 mg  200 mg Oral Daily Eduard Clos, MD   200 mg at 01/11/20 1253  . ketorolac (ACULAR) 0.5 % ophthalmic solution 1 drop  1 drop Both Eyes BID Eduard Clos, MD   1 drop at 01/11/20 1256  . lisinopril (ZESTRIL) tablet 10 mg  10 mg Oral Daily Eduard Clos, MD   10 mg at 01/11/20 1252  . metroNIDAZOLE (METROGEL) 0.75 % gel   Topical Daily Eduard Clos, MD      . Netarsudil-Latanoprost 0.02-0.005 % SOLN 1 drop  1 drop Ophthalmic QHS Eduard Clos, MD   1 drop at 01/10/20 2238  . ondansetron (ZOFRAN) injection 4 mg  4 mg Intravenous Q6H PRN Eduard Clos, MD      . pantoprazole (PROTONIX) EC tablet 40 mg  40 mg Oral Daily Eduard Clos, MD   40 mg at 01/11/20 1251  . polyethylene glycol (MIRALAX / GLYCOLAX) packet 17 g  17 g Oral BID PRN Gonfa, Taye T, MD      . predniSONE (DELTASONE) tablet 5 mg  5 mg Oral QAC breakfast Eduard Clos, MD   5 mg at 01/11/20 1328  . senna-docusate (Senokot-S) tablet 1 tablet  1 tablet Oral BID PRN Almon Hercules, MD      . traMADol-acetaminophen (ULTRACET) 37.5-325 MG per tablet 1 tablet  1 tablet Oral BID Eduard Clos, MD   1 tablet at 01/11/20 1000     Discharge Medications: Please see discharge summary for a list of discharge medications.  Relevant Imaging Results:  Relevant Lab Results:   Additional Information [id number]  Mearl Latin, LCSW

## 2020-01-11 NOTE — Progress Notes (Signed)
ANTICOAGULATION CONSULT NOTE Pharmacy Consult:  Heparin Indication: atrial fibrillation  Allergies  Allergen Reactions  . Penicillins Hives and Swelling    Has patient had a PCN reaction causing immediate rash, facial/tongue/throat swelling, SOB or lightheadedness with hypotension: Yes Has patient had a PCN reaction causing severe rash involving mucus membranes or skin necrosis: No Has patient had a PCN reaction that required hospitalization: No Has patient had a PCN reaction occurring within the last 10 years: No If all of the above answers are "NO", then may proceed with Cephalosporin use.   . Brimonidine Other (See Comments)    Don't remember  . Other     Nuts causes blisters No spices coffee  . Penicillin G Other (See Comments)  . Timolol Maleate Other (See Comments)    Pt becomes faint  . Sulfa Antibiotics Rash  . Sulfasalazine Rash    Patient Measurements: Height: 5\' 3"  (160 cm) Weight: 43.5 kg (95 lb 14.4 oz) IBW/kg (Calculated) : 52.4 Heparin Dosing Weight: 44 kg  Vital Signs: Temp: 98.5 F (36.9 C) (05/28 0331) Temp Source: Oral (05/28 0331) BP: 109/70 (05/28 0331) Pulse Rate: 74 (05/28 0331)  Labs: Recent Labs    01/09/20 1238 01/09/20 1238 01/09/20 1520 01/10/20 0720 01/10/20 1320 01/11/20 0331  HGB 14.4   < >  --  14.3  --  13.4  HCT 44.7  --   --  44.1  --  41.4  PLT 151  --   --  146*  --  147*  APTT  --   --   --  67* 66* 59*  HEPARINUNFRC  --   --   --  1.04*  --  0.98*  CREATININE 1.40*  --   --   --   --  1.21*  TROPONINIHS 10  --  10  --   --   --    < > = values in this interval not displayed.    Estimated Creatinine Clearance: 23.3 mL/min (A) (by C-G formula based on SCr of 1.21 mg/dL (H)).   Medical History: Past Medical History:  Diagnosis Date  . Atrial fibrillation (HCC)   . Hypertension   . Immune system disorder (HCC)   . Polymyalgia (HCC)     Assessment: 37 YOF with history of Afib presented with thoracic pain.   Pharmacy consulted to transition from Eliquis to IV heparin, last Eliquis dose was 01/09/20.  Plans to resume oral anticoagulation after myoview results -aPTT below goal  Goal of Therapy:  Heparin level 0.3-0.7 units/ml aPTT 66-102 seconds Monitor platelets by anticoagulation protocol: Yes   Plan:  Increase heparin to 700units/hr Daily heparin level, aPTT and CBC  01/11/20, PharmD Clinical Pharmacist **Pharmacist phone directory can now be found on [email or web address] ([email or web address]).  Listed under [facility].

## 2020-01-11 NOTE — Progress Notes (Signed)
Subjective:  Atypical left sided muscular pain  In nuclear for myovue   Objective:  Vitals:   01/10/20 1457 01/10/20 1943 01/10/20 2331 01/11/20 0331  BP: (!) 147/94 (!) 153/82 140/81 109/70  Pulse: 82  70 74  Resp: 16 19 17 15   Temp: 98.2 F (36.8 C) 98.3 F (36.8 C) 98.5 F (36.9 C) 98.5 F (36.9 C)  TempSrc: Oral Oral Oral Oral  SpO2: 99% 96% 94% 99%  Weight:    43.5 kg  Height:        Intake/Output from previous day: No intake or output data in the 24 hours ending 01/11/20 0935  Physical Exam: Affect appropriate Healthy:  appears stated age HEENT: normal Neck supple with no adenopathy JVP normal no bruits no thyromegaly Lungs clear with no wheezing and good diaphragmatic motion Heart:  S1/S2 no murmur, no rub, gallop or click PMI normal Abdomen: benighn, BS positve, no tenderness, no AAA no bruit.  No HSM or HJR Distal pulses intact with no bruits No edema Neuro non-focal Skin warm and dry No muscular weakness   Lab Results: Basic Metabolic Panel: Recent Labs    01/09/20 1238 01/11/20 0331  NA 140 142  K 4.6 3.6  CL 102 109  CO2 26 25  GLUCOSE 99 87  BUN 30* 25*  CREATININE 1.40* 1.21*  CALCIUM 9.2 8.9  MG  --  1.9  PHOS  --  3.8   Liver Function Tests: Recent Labs    01/11/20 0331  ALBUMIN 3.5   No results for input(s): LIPASE, AMYLASE in the last 72 hours. CBC: Recent Labs    01/09/20 1238 01/09/20 1238 01/10/20 0720 01/11/20 0331  WBC 6.2   < > 6.1 5.1  NEUTROABS 5.2  --   --   --   HGB 14.4   < > 14.3 13.4  HCT 44.7   < > 44.1 41.4  MCV 101.6*   < > 100.0 100.7*  PLT 151   < > 146* 147*   < > = values in this interval not displayed.   Cardiac Enzymes: No results for input(s): CKTOTAL, CKMB, CKMBINDEX, TROPONINI in the last 72 hours. BNP: Invalid input(s): POCBNP D-Dimer: Recent Labs    01/09/20 1238  DDIMER <0.27    Imaging: DG Chest 2 View  Result Date: 01/09/2020 CLINICAL DATA:  Mid left chest pain since  last night. EXAM: CHEST - 2 VIEW COMPARISON:  09/29/2019 FINDINGS: Normal sized heart. Mildly tortuous and partially calcified thoracic aorta. The lungs remain hyperexpanded with a stable small calcified granuloma at the right lung base. Biapical pleural and parenchymal scarring has not changed significantly, including small areas of calcification. Interval possible calcified pleural plaque underneath an EKG lead at the superior aspect of the left lung. Diffuse osteopenia. Mild to moderate scoliosis. IMPRESSION: 1. No acute abnormality. 2. Stable changes of COPD. 3. Interval possible calcified pleural plaque underneath an EKG lead at the superior aspect of the left lung. Electronically Signed   By: 10/01/2019 M.D.   On: 01/09/2020 12:46   DG Ribs Unilateral Left  Result Date: 01/09/2020 CLINICAL DATA:  Left-sided rib pain EXAM: LEFT RIBS - 2 VIEW COMPARISON:  Chest x-ray 01/09/2020 FINDINGS: No fracture or other bone lesions are seen involving the ribs. IMPRESSION: Negative. Electronically Signed   By: 01/11/2020 M.D.   On: 01/09/2020 22:45   MR THORACIC SPINE WO CONTRAST  Result Date: 01/09/2020 CLINICAL DATA:  Mid back pain EXAM: MRI THORACIC  SPINE WITHOUT CONTRAST TECHNIQUE: Multiplanar, multisequence MR imaging of the thoracic spine was performed. No intravenous contrast was administered. COMPARISON:  None. FINDINGS: Alignment:  Right convex scoliosis with apex at T11 Vertebrae: No fracture, evidence of discitis, or bone lesion. Cord: No focal disc herniation. No spinal canal or neural foraminal stenosis. Paraspinal and other soft tissues: Biapical opacities, likely scarring. Disc levels: No spinal canal or neural foraminal stenosis. IMPRESSION: 1. No acute abnormality of the thoracic spine. 2. Right convex scoliosis with apex at T11. 3. No spinal canal or neural foraminal stenosis. Electronically Signed   By: Ulyses Jarred M.D.   On: 01/09/2020 22:53    Cardiac Studies:  ECG: NSR no acute  changes    Telemetry:  NSR no arrhythmia    Medications:   . ALPRAZolam  0.25 mg Oral BID  . brinzolamide  1 drop Both Eyes TID  . cycloSPORINE  1 drop Both Eyes BID  . diltiazem  120 mg Oral Daily  . ferrous sulfate  325 mg Oral Daily  . hydroxychloroquine  200 mg Oral Daily  . ketorolac  1 drop Both Eyes BID  . lisinopril  10 mg Oral Daily  . metroNIDAZOLE   Topical Daily  . Netarsudil-Latanoprost  1 drop Ophthalmic QHS  . pantoprazole  40 mg Oral Daily  . predniSONE  5 mg Oral QAC breakfast  . traMADol-acetaminophen  1 tablet Oral BID     . heparin 650 Units/hr (01/10/20 1453)    Assessment/Plan:   1. Chest pain :  Atypical r/o no acute ECG changes d/c home today if myovue normal  2. PAF:  In NSR continue low dose eliquis (age,weight) and cardizem 3. HTN: well controlled continue ACE  Jenkins Rouge 01/11/2020, 9:35 AM

## 2020-01-12 LAB — RENAL FUNCTION PANEL
Albumin: 3.7 g/dL (ref 3.5–5.0)
Anion gap: 10 (ref 5–15)
BUN: 25 mg/dL — ABNORMAL HIGH (ref 8–23)
CO2: 22 mmol/L (ref 22–32)
Calcium: 9.3 mg/dL (ref 8.9–10.3)
Chloride: 111 mmol/L (ref 98–111)
Creatinine, Ser: 1.16 mg/dL — ABNORMAL HIGH (ref 0.44–1.00)
GFR calc Af Amer: 50 mL/min — ABNORMAL LOW (ref >60)
GFR calc non Af Amer: 43 mL/min — ABNORMAL LOW (ref >60)
Glucose, Bld: 79 mg/dL (ref 70–99)
Phosphorus: 3.3 mg/dL (ref 2.5–4.6)
Potassium: 3.7 mmol/L (ref 3.5–5.1)
Sodium: 143 mmol/L (ref 135–145)

## 2020-01-12 LAB — CBC
HCT: 43.6 % (ref 36.0–46.0)
Hemoglobin: 14.2 g/dL (ref 12.0–15.0)
MCH: 32.4 pg (ref 26.0–34.0)
MCHC: 32.6 g/dL (ref 30.0–36.0)
MCV: 99.5 fL (ref 80.0–100.0)
Platelets: 153 10*3/uL (ref 150–400)
RBC: 4.38 MIL/uL (ref 3.87–5.11)
RDW: 13.1 % (ref 11.5–15.5)
WBC: 5.6 10*3/uL (ref 4.0–10.5)
nRBC: 0 % (ref 0.0–0.2)

## 2020-01-12 LAB — MAGNESIUM: Magnesium: 1.9 mg/dL (ref 1.7–2.4)

## 2020-01-12 MED ORDER — ADULT MULTIVITAMIN W/MINERALS CH
1.0000 | ORAL_TABLET | Freq: Every day | ORAL | Status: DC
  Administered 2020-01-14: 1 via ORAL
  Filled 2020-01-12 (×2): qty 1

## 2020-01-12 MED ORDER — ENSURE ENLIVE PO LIQD
237.0000 mL | Freq: Two times a day (BID) | ORAL | Status: DC
  Administered 2020-01-13: 237 mL via ORAL

## 2020-01-12 NOTE — Progress Notes (Signed)
Initial Nutrition Assessment  RD working remotely.  DOCUMENTATION CODES:   Underweight  INTERVENTION:   -Ensure Enlive po BID, each supplement provides 350 kcal and 20 grams of protein -MVI with minerals daily  NUTRITION DIAGNOSIS:   Increased nutrient needs related to chronic illness(lupus) as evidenced by estimated needs.  GOAL:   Patient will meet greater than or equal to 90% of their needs  MONITOR:   PO intake, Supplement acceptance, Labs, Weight trends, Skin, I & O's  REASON FOR ASSESSMENT:   Consult Assessment of nutrition requirement/status  ASSESSMENT:   Jodi Snyder is a 86 y.o. female with history of A. fib hypertension polymyalgia rheumatica discoid lupus presents to the ER at [facility] with complaint of chest pain.  Pt admitted with chest pain concerning for ACS.   5/28- s/p stress test  Reviewed I/O's: +20 ml x 24 hours  RD attempted to speak with pt via phone, however, no answer.   Reviewed wt hx; pt has experienced a 5% wt loss over the past 6 months. While this is not significant for time frame, it is concerning given pt's advanced age and underweight status.   Highly suspect malnutrition, however, unable to identify at this time.   Medications reviewed and include cardizem, ferrous sulfate, and prednisone.   Per [facility] team notes, plan for SNF placement at discharge.   Labs reviewed.   Diet Order:   Diet Order            Diet Heart Room service appropriate? Yes; Fluid consistency: Thin  Diet effective now              EDUCATION NEEDS:   No education needs have been identified at this time  Skin:  Skin Assessment: Reviewed RN Assessment  Last BM:  01/10/20  Height:   Ht Readings from Last 1 Encounters:  01/09/20 5\' 3"  (1.6 m)    Weight:   Wt Readings from Last 1 Encounters:  01/12/20 43.1 kg    Ideal Body Weight:  52.3 kg  BMI:  Body mass index is 16.83 kg/m.  Estimated Nutritional Needs:   Kcal:   1300-1500  Protein:  55-70 grams  Fluid:  > 1.3 L    01/14/20, RD, LDN, CDCES Registered Dietitian II Certified Diabetes Care and Education Specialist Please refer to [facility] for RD and/or RD on-call/weekend/after hours pager

## 2020-01-12 NOTE — Progress Notes (Signed)
PROGRESS NOTE  Jodi Snyder QMV:[id number] DOB: [birth year]   PCP: Elspeth Cho., MD  Patient is from: Home.  Lives alone.  Ambulates using walker at baseline.  DOA: 01/09/2020 LOS: 1  Brief Narrative / Interim history: 86 year old female with history of A. fib, HTN, PMR and discoid lupus who presented to [facility] ED with chest pain, and admitted for ACS rule out.  In ED, CXR without acute finding.  EKG with mild ST changes laterally but improved on repeat.  D-dimer and high-sensitivity troponin negative.  Admitted for ACS rule out.  Cardiology consulted.  She also had MRI of the spine ordered.   The next day, chest pain improved significantly.  MRI without acute finding.  Cardiology recommended Myoview which was reported as low risk.   Therapy recommended SNF.  Patient lives alone.  Has a stepson who visits occasionally but no other family members.   Subjective: Seen and examined earlier this morning.  No major events overnight of this morning.  Feels better this morning. She is now anxious about going to rehab but understands the rationale.  She denies chest pain, dyspnea, GI or UTI symptoms.  Objective: Vitals:   01/12/20 0618 01/12/20 0812 01/12/20 0949 01/12/20 1124  BP: 110/86  (!) 143/82 139/88  Pulse: 65 (!) 58 65 64  Resp: 19 18    Temp: 98.3 F (36.8 C) (!) 97.5 F (36.4 C)  98 F (36.7 C)  TempSrc: Oral Oral  Oral  SpO2: 95% 100%  97%  Weight: 43.1 kg     Height:       No intake or output data in the 24 hours ending 01/12/20 1139 Filed Weights   01/10/20 0542 01/11/20 0331 01/12/20 0618  Weight: 44 kg 43.5 kg 43.1 kg    Examination:  GENERAL: Frail looking elderly female.  No apparent distress.  Nontoxic. HEENT: MMM.  Vision and hearing grossly intact.  NECK: Supple.  No apparent JVD.  RESP: On room air.  No IWOB.  Fair aeration bilaterally. CVS:  RRR. Heart sounds normal.  ABD/GI/GU: BS+. Abd soft, NTND.  MSK/EXT:  Moves extremities.  Significant muscle  mass and subcu fat loss. SKIN: no apparent skin lesion or wound NEURO: Awake, alert and oriented appropriately.  No apparent focal neuro deficit. PSYCH: Calm. Normal affect.  Good insight.  Procedures:  5/28-low risk nuclear stress test  Microbiology summarized: COVID-19 PCR negative.  Assessment & Plan: Atypical chest pain: Initial EKG with mild ST changes in lateral leads that has improved on repeat EKG.  Serial troponin negative. Nuclear stress test negative.  Echocardiogram reassuring.  Cardiology signed off.  Chest pain resolved as well.  Paroxysmal A. Fib: Rate controlled. -Continue Cardizem and Eliquis.  History of PMR and discoid lupus- -continue home prednisone and Plaquenil  Essential hypertension: Normotensive. -Continue home lisinopril and Cardizem -As needed hydralazine  AKI on CKD-3A: Baseline Cr~1.0-1.1> 1.4 (admit)> 1.16 -Continue monitoring -Avoid nephrotoxic meds  Anxiety/chronic pain/scoliosis -Continue home Xanax, tramadol and Tylenol  Debility/physical deconditioning: At baseline reports using walker for ambulation. Therapy recommended SNF for 24-hour supervision.  Patient lives alone to be discharged home safely while on Eliquis for A. fib.  The closest family member is a step son, Arlys John who occasionally visits.  Patient is anxious about going to SNF but understands the rationale. -Waiting on SNF.   Severe malnutrition as evidenced by BMI of 16.83 and significant muscle mass and subcu fat loss. Nutrition Problem: Increased nutrient needs Etiology: chronic illness(lupus) Signs/Symptoms: estimated needs  Interventions: Ensure Enlive (each supplement provides 350kcal and 20 grams of protein), MVI   DVT prophylaxis: On Eliquis for A. fib. Code Status: DNR/DNI Family Communication: Attempted to call patient's [place] [place] for update but no answer. Status is: Observation   Dispo: The patient is from: Home              Anticipated d/c is to: SNF               Anticipated d/c date is: 2 days              Patient currently is medically stable to d/c.       Consultants:  Cardiology-signed off.   Sch Meds:  Scheduled Meds: . ALPRAZolam  0.25 mg Oral BID  . apixaban  2.5 mg Oral BID  . brinzolamide  1 drop Both Eyes TID  . cycloSPORINE  1 drop Both Eyes BID  . diltiazem  120 mg Oral Daily  . feeding supplement (ENSURE ENLIVE)  237 mL Oral BID BM  . ferrous sulfate  325 mg Oral Daily  . hydroxychloroquine  200 mg Oral Daily  . ketorolac  1 drop Both Eyes BID  . lisinopril  10 mg Oral Daily  . metroNIDAZOLE   Topical Daily  . multivitamin with minerals  1 tablet Oral Daily  . Netarsudil-Latanoprost  1 drop Ophthalmic QHS  . pantoprazole  40 mg Oral Daily  . predniSONE  5 mg Oral QAC breakfast  . traMADol-acetaminophen  1 tablet Oral BID   Continuous Infusions:  PRN Meds:.acetaminophen, ondansetron (ZOFRAN) IV, polyethylene glycol, senna-docusate  Antimicrobials: Anti-infectives (From admission, onward)   Start     Dose/Rate Route Frequency Ordered Stop   01/10/20 1000  hydroxychloroquine (PLAQUENIL) tablet 200 mg     200 mg Oral Daily 01/09/20 2217         I have personally reviewed the following labs and images: CBC: Recent Labs  Lab 01/09/20 1238 01/10/20 0720 01/11/20 0331 01/12/20 0722  WBC 6.2 6.1 5.1 5.6  NEUTROABS 5.2  --   --   --   HGB 14.4 14.3 13.4 14.2  HCT 44.7 44.1 41.4 43.6  MCV 101.6* 100.0 100.7* 99.5  PLT 151 146* 147* 153   BMP &GFR Recent Labs  Lab 01/09/20 1238 01/11/20 0331 01/12/20 0722  NA 140 142 143  K 4.6 3.6 3.7  CL 102 109 111  CO2 26 25 22   GLUCOSE 99 87 79  BUN 30* 25* 25*  CREATININE 1.40* 1.21* 1.16*  CALCIUM 9.2 8.9 9.3  MG  --  1.9 1.9  PHOS  --  3.8 3.3   Estimated Creatinine Clearance: 24.1 mL/min (A) (by C-G formula based on SCr of 1.16 mg/dL (H)). Liver & Pancreas: Recent Labs  Lab 01/11/20 0331 01/12/20 0722  ALBUMIN 3.5 3.7   No results for input(s):  LIPASE, AMYLASE in the last 168 hours. No results for input(s): AMMONIA in the last 168 hours. Diabetic: No results for input(s): HGBA1C in the last 72 hours. No results for input(s): GLUCAP in the last 168 hours. Cardiac Enzymes: No results for input(s): CKTOTAL, CKMB, CKMBINDEX, TROPONINI in the last 168 hours. No results for input(s): PROBNP in the last 8760 hours. Coagulation Profile: No results for input(s): INR, PROTIME in the last 168 hours. Thyroid Function Tests: No results for input(s): TSH, T4TOTAL, FREET4, T3FREE, THYROIDAB in the last 72 hours. Lipid Profile: No results for input(s): CHOL, HDL, LDLCALC, TRIG, CHOLHDL, LDLDIRECT in the last  72 hours. Anemia Panel: No results for input(s): VITAMINB12, FOLATE, FERRITIN, TIBC, IRON, RETICCTPCT in the last 72 hours. Urine analysis:    Component Value Date/Time   COLORURINE STRAW (A) 08/19/2018 0838   APPEARANCEUR CLEAR 08/19/2018 0838   LABSPEC 1.005 08/19/2018 0838   PHURINE 9.0 (H) 08/19/2018 0838   GLUCOSEU NEGATIVE 08/19/2018 0838   HGBUR NEGATIVE 08/19/2018 0838   BILIRUBINUR NEGATIVE 08/19/2018 0838   KETONESUR NEGATIVE 08/19/2018 0838   PROTEINUR NEGATIVE 08/19/2018 0838   NITRITE NEGATIVE 08/19/2018 0838   LEUKOCYTESUR NEGATIVE 08/19/2018 0838   Sepsis Labs: Invalid input(s): PROCALCITONIN, [place]  Microbiology: Recent Results (from the past 240 hour(s))  SARS Coronavirus 2 by RT PCR (hospital order, performed in [facility] hospital lab) Nasopharyngeal Nasopharyngeal Swab     Status: None   Collection Time: 01/09/20  2:53 PM   Specimen: Nasopharyngeal Swab  Result Value Ref Range Status   SARS Coronavirus 2 NEGATIVE NEGATIVE Final    Comment: (NOTE) SARS-CoV-2 target nucleic acids are NOT DETECTED. The SARS-CoV-2 RNA is generally detectable in upper and lower respiratory specimens during the acute phase of infection. The lowest concentration of SARS-CoV-2 viral copies this assay can detect is 250  copies / mL. A negative result does not preclude SARS-CoV-2 infection and should not be used as the sole basis for treatment or other patient management decisions.  A negative result may occur with improper specimen collection / handling, submission of specimen other than nasopharyngeal swab, presence of viral mutation(s) within the areas targeted by this assay, and inadequate number of viral copies (<250 copies / mL). A negative result must be combined with clinical observations, patient history, and epidemiological information. Fact Sheet for Patients:   StrictlyIdeas.no Fact Sheet for Healthcare Providers: BankingDealers.co.za This test is not yet approved or cleared  by the [place] FDA and has been authorized for detection and/or diagnosis of SARS-CoV-2 by FDA under an Emergency Use Authorization (EUA).  This EUA will remain in effect (meaning this test can be used) for the duration of the COVID-19 declaration under Section 564(b)(1) of the Act, 21 U.S.C. section 360bbb-3(b)(1), unless the authorization is terminated or revoked sooner. Performed at [facility], [address] [place], [place] [address]     Radiology Studies: NM Myocar Multi W/Spect Tamela Oddi Motion / EF  Result Date: 01/11/2020  There was no ST segment deviation noted during stress.  Nuclear stress EF: 65%. The left ventricular ejection fraction is normal (55-65%).  This is a low risk study. No evidence of ischemia or previous infarction  The study is normal.      Taye T. [place]  If 7PM-7AM, please contact night-coverage [email or web address] Password [facility] 01/12/2020, 11:39 AM

## 2020-01-12 NOTE — TOC Progression Note (Signed)
Transition of Care [facility]) - Progression Note    Patient Details  Name: Jodi Snyder MRN: [id number] Date of Birth: 07-Jan-1932  Transition of Care [facility]) CM/SW Contact  Patrice [place], [place] Phone Number: [phone] 01/12/2020, 11:37 AM  Clinical Narrative:     CSW faxed referral to local facilities surrounding [place]. Awaiting bed offers.  TOC team will continue to follow for discharge planning needs.  Expected Discharge Plan: Skilled Nursing Facility Barriers to Discharge: Continued Medical Work up  Expected Discharge Plan and Services Expected Discharge Plan: Skilled Nursing Facility       Living arrangements for the past 2 months: Single Family Home                                       Social Determinants of Health (SDOH) Interventions    Readmission Risk Interventions No flowsheet data found.

## 2020-01-13 LAB — RENAL FUNCTION PANEL
Albumin: 3.5 g/dL (ref 3.5–5.0)
Anion gap: 9 (ref 5–15)
BUN: 34 mg/dL — ABNORMAL HIGH (ref 8–23)
CO2: 25 mmol/L (ref 22–32)
Calcium: 9.4 mg/dL (ref 8.9–10.3)
Chloride: 107 mmol/L (ref 98–111)
Creatinine, Ser: 1.57 mg/dL — ABNORMAL HIGH (ref 0.44–1.00)
GFR calc Af Amer: 34 mL/min — ABNORMAL LOW (ref >60)
GFR calc non Af Amer: 30 mL/min — ABNORMAL LOW (ref >60)
Glucose, Bld: 92 mg/dL (ref 70–99)
Phosphorus: 4.1 mg/dL (ref 2.5–4.6)
Potassium: 4.1 mmol/L (ref 3.5–5.1)
Sodium: 141 mmol/L (ref 135–145)

## 2020-01-13 MED ORDER — SODIUM CHLORIDE 0.9 % IV SOLN: INTRAVENOUS | Status: DC

## 2020-01-13 NOTE — Progress Notes (Signed)
Ms. Shiffer has had an uneventful day, ambulating to the bathroom several times with assistance from nursing staff. She has been started on an IV infusion of normal saline running at 50 mL/hr. She is currently resting in bed and has no additional needs at this time.

## 2020-01-13 NOTE — Progress Notes (Signed)
PROGRESS NOTE  Jodi Snyder IRJ:[id number] DOB: 07-12-1932   PCP: Elspeth Cho., MD  Patient is from: Home.  Lives alone.  Ambulates using walker at baseline.  DOA: 01/09/2020 LOS: 2  Brief Narrative / Interim history: 86 year old female with history of A. fib, HTN, PMR and discoid lupus who presented to [facility] ED with chest pain, and admitted for ACS rule out.  In ED, CXR without acute finding.  EKG with mild ST changes laterally but improved on repeat.  D-dimer and high-sensitivity troponin negative.  Admitted for ACS rule out.  Cardiology consulted.  She also had MRI of the spine ordered.   The next day, chest pain improved significantly.  MRI without acute finding.  Cardiology recommended Myoview which was reported as low risk.   Therapy recommended SNF.  Patient lives alone.  Has a stepson who visits occasionally but no other family members.   Subjective: Seen and examined earlier this morning.  No major events overnight of this morning.  No complaints other than falling asleep due some voices from the room across overnight.  She says she has not been drinking a lot as she doesn't want to bother people.  Denies chest pain, dyspnea, GI or UTI symptoms.  Objective: Vitals:   01/13/20 0649 01/13/20 0810 01/13/20 0934 01/13/20 1205  BP: 134/83 (!) 154/97 128/72 140/76  Pulse: (!) 58 (!) 58  68  Resp: 16 17    Temp: 98 F (36.7 C) 97.8 F (36.6 C)  98 F (36.7 C)  TempSrc: Oral Oral  Oral  SpO2: 99% 100%  98%  Weight:      Height:        Intake/Output Summary (Last 24 hours) at 01/13/2020 1214 Last data filed at 01/13/2020 1130 Gross per 24 hour  Intake 240 ml  Output --  Net 240 ml   Filed Weights   01/10/20 0542 01/11/20 0331 01/12/20 0618  Weight: 44 kg 43.5 kg 43.1 kg    Examination:  GENERAL: Frail looking elderly female.  No apparent distress.  Nontoxic. HEENT: MMM.  Vision and hearing grossly intact.  NECK: Supple.  No apparent JVD.  RESP:  No IWOB.   Fair aeration bilaterally. CVS:  RRR. Heart sounds normal.  ABD/GI/GU: BS+. Abd soft, NTND.  MSK/EXT:  Moves extremities. No apparent deformity. No edema.  SKIN: no apparent skin lesion or wound NEURO: Awake, alert and oriented appropriately.  No apparent focal neuro deficit. PSYCH: Calm. Normal affect.  Good insight.  Procedures:  5/28-low risk nuclear stress test  Microbiology summarized: COVID-19 PCR negative.  Assessment & Plan: Atypical chest pain: Initial EKG with mild ST changes in lateral leads that has improved on repeat EKG.  Serial troponin negative. Nuclear stress test negative.  Echocardiogram reassuring.  Cardiology signed off.  Chest pain resolved as well.  Paroxysmal A. Fib: Rate controlled. -Continue Cardizem and Eliquis.  History of PMR and discoid lupus- -continue home prednisone and Plaquenil  Essential hypertension: Normotensive. -Continue home Cardizem.  Discontinue lisinopril in the setting of AKI -As needed hydralazine  AKI on CKD-3A: Baseline Cr~1.0-1.1> 1.4 (admit)> 1.16> 1.57 -IV NS at 50 cc an hour -Encourage oral intake -Discontinue lisinopril -Avoid nephrotoxic meds  Anxiety/chronic pain/scoliosis/insomnia -Continue home Xanax and Ultracet.  Debility/physical deconditioning: At baseline reports using walker for ambulation. Therapy recommended SNF for 24-hour supervision.  Patient lives alone to be discharged home safely while on Eliquis for A. fib.  The closest family member is a step son, Jodi Snyder who occasionally visits.  Patient is anxious about going to SNF but understands the rationale. -Waiting on SNF.   Severe malnutrition/FTT: BMI of 16.83 and significant muscle mass and subcu fat loss.  Poor oral intake Nutrition Problem: Increased nutrient needs Etiology: chronic illness(lupus) Signs/Symptoms: estimated needs Interventions: Ensure Enlive (each supplement provides 350kcal and 20 grams of protein), MVI   DVT prophylaxis: On Eliquis for  A. fib. Code Status: DNR/DNI Family Communication: Attempted to call patient's Jodi Snyder for update but no answer.  Status is: Inpatient  Remains inpatient appropriate because:Unsafe d/c plan, IV treatments appropriate due to intensity of illness or inability to take PO and IV fluid for AKI due to poor p.o. intake   Dispo: The patient is from: Home              Anticipated d/c is to: SNF              Anticipated d/c date is: 2 days              Patient currently is medically stable to d/c.            Consultants:  Cardiology-signed off.   Sch Meds:  Scheduled Meds: . ALPRAZolam  0.25 mg Oral BID  . apixaban  2.5 mg Oral BID  . brinzolamide  1 drop Both Eyes TID  . cycloSPORINE  1 drop Both Eyes BID  . diltiazem  120 mg Oral Daily  . feeding supplement (ENSURE ENLIVE)  237 mL Oral BID BM  . ferrous sulfate  325 mg Oral Daily  . hydroxychloroquine  200 mg Oral Daily  . ketorolac  1 drop Both Eyes BID  . lisinopril  10 mg Oral Daily  . metroNIDAZOLE   Topical Daily  . multivitamin with minerals  1 tablet Oral Daily  . Netarsudil-Latanoprost  1 drop Ophthalmic QHS  . pantoprazole  40 mg Oral Daily  . predniSONE  5 mg Oral QAC breakfast  . traMADol-acetaminophen  1 tablet Oral BID   Continuous Infusions: . sodium chloride 50 mL/hr at 01/13/20 0925   PRN Meds:.acetaminophen, ondansetron (ZOFRAN) IV, polyethylene glycol, senna-docusate  Antimicrobials: Anti-infectives (From admission, onward)   Start     Dose/Rate Route Frequency Ordered Stop   01/10/20 1000  hydroxychloroquine (PLAQUENIL) tablet 200 mg     200 mg Oral Daily 01/09/20 2217         I have personally reviewed the following labs and images: CBC: Recent Labs  Lab 01/09/20 1238 01/10/20 0720 01/11/20 0331 01/12/20 0722  WBC 6.2 6.1 5.1 5.6  NEUTROABS 5.2  --   --   --   HGB 14.4 14.3 13.4 14.2  HCT 44.7 44.1 41.4 43.6  MCV 101.6* 100.0 100.7* 99.5  PLT 151 146* 147* 153   BMP  &GFR Recent Labs  Lab 01/09/20 1238 01/11/20 0331 01/12/20 0722 01/13/20 0425  NA 140 142 143 141  K 4.6 3.6 3.7 4.1  CL 102 109 111 107  CO2 26 25 22 25   GLUCOSE 99 87 79 92  BUN 30* 25* 25* 34*  CREATININE 1.40* 1.21* 1.16* 1.57*  CALCIUM 9.2 8.9 9.3 9.4  MG  --  1.9 1.9  --   PHOS  --  3.8 3.3 4.1   Estimated Creatinine Clearance: 17.8 mL/min (A) (by C-G formula based on SCr of 1.57 mg/dL (H)). Liver & Pancreas: Recent Labs  Lab 01/11/20 0331 01/12/20 0722 01/13/20 0425  ALBUMIN 3.5 3.7 3.5   No results for input(s): LIPASE, AMYLASE  in the last 168 hours. No results for input(s): AMMONIA in the last 168 hours. Diabetic: No results for input(s): HGBA1C in the last 72 hours. No results for input(s): GLUCAP in the last 168 hours. Cardiac Enzymes: No results for input(s): CKTOTAL, CKMB, CKMBINDEX, TROPONINI in the last 168 hours. No results for input(s): PROBNP in the last 8760 hours. Coagulation Profile: No results for input(s): INR, PROTIME in the last 168 hours. Thyroid Function Tests: No results for input(s): TSH, T4TOTAL, FREET4, T3FREE, THYROIDAB in the last 72 hours. Lipid Profile: No results for input(s): CHOL, HDL, LDLCALC, TRIG, CHOLHDL, LDLDIRECT in the last 72 hours. Anemia Panel: No results for input(s): VITAMINB12, FOLATE, FERRITIN, TIBC, IRON, RETICCTPCT in the last 72 hours. Urine analysis:    Component Value Date/Time   COLORURINE STRAW (A) 08/19/2018 0838   APPEARANCEUR CLEAR 08/19/2018 0838   LABSPEC 1.005 08/19/2018 0838   PHURINE 9.0 (H) 08/19/2018 0838   GLUCOSEU NEGATIVE 08/19/2018 0838   HGBUR NEGATIVE 08/19/2018 0838   BILIRUBINUR NEGATIVE 08/19/2018 0838   KETONESUR NEGATIVE 08/19/2018 0838   PROTEINUR NEGATIVE 08/19/2018 0838   NITRITE NEGATIVE 08/19/2018 0838   LEUKOCYTESUR NEGATIVE 08/19/2018 0838   Sepsis Labs: Invalid input(s): PROCALCITONIN, [place]  Microbiology: Recent Results (from the past 240 hour(s))  SARS  Coronavirus 2 by RT PCR (hospital order, performed in [facility] hospital lab) Nasopharyngeal Nasopharyngeal Swab     Status: None   Collection Time: 01/09/20  2:53 PM   Specimen: Nasopharyngeal Swab  Result Value Ref Range Status   SARS Coronavirus 2 NEGATIVE NEGATIVE Final    Comment: (NOTE) SARS-CoV-2 target nucleic acids are NOT DETECTED. The SARS-CoV-2 RNA is generally detectable in upper and lower respiratory specimens during the acute phase of infection. The lowest concentration of SARS-CoV-2 viral copies this assay can detect is 250 copies / mL. A negative result does not preclude SARS-CoV-2 infection and should not be used as the sole basis for treatment or other patient management decisions.  A negative result may occur with improper specimen collection / handling, submission of specimen other than nasopharyngeal swab, presence of viral mutation(s) within the areas targeted by this assay, and inadequate number of viral copies (<250 copies / mL). A negative result must be combined with clinical observations, patient history, and epidemiological information. Fact Sheet for Patients:   StrictlyIdeas.no Fact Sheet for Healthcare Providers: BankingDealers.co.za This test is not yet approved or cleared  by the [place] FDA and has been authorized for detection and/or diagnosis of SARS-CoV-2 by FDA under an Emergency Use Authorization (EUA).  This EUA will remain in effect (meaning this test can be used) for the duration of the COVID-19 declaration under Section 564(b)(1) of the Act, 21 U.S.C. section 360bbb-3(b)(1), unless the authorization is terminated or revoked sooner. Performed at [facility], [address], [place], [place] [address]     Radiology Studies: No results found.   Madeliene Tejera T. [place]  If 7PM-7AM, please contact night-coverage [email or web address] Password [email or web address] 01/13/2020, 12:14 PM

## 2020-01-14 DIAGNOSIS — N189 Chronic kidney disease, unspecified: Secondary | ICD-10-CM

## 2020-01-14 LAB — RENAL FUNCTION PANEL
Albumin: 3.2 g/dL — ABNORMAL LOW (ref 3.5–5.0)
Anion gap: 6 (ref 5–15)
BUN: 39 mg/dL — ABNORMAL HIGH (ref 8–23)
CO2: 25 mmol/L (ref 22–32)
Calcium: 8.7 mg/dL — ABNORMAL LOW (ref 8.9–10.3)
Chloride: 112 mmol/L — ABNORMAL HIGH (ref 98–111)
Creatinine, Ser: 1.35 mg/dL — ABNORMAL HIGH (ref 0.44–1.00)
GFR calc Af Amer: 41 mL/min — ABNORMAL LOW (ref >60)
GFR calc non Af Amer: 36 mL/min — ABNORMAL LOW (ref >60)
Glucose, Bld: 84 mg/dL (ref 70–99)
Phosphorus: 3.4 mg/dL (ref 2.5–4.6)
Potassium: 3.9 mmol/L (ref 3.5–5.1)
Sodium: 143 mmol/L (ref 135–145)

## 2020-01-14 LAB — MAGNESIUM: Magnesium: 1.9 mg/dL (ref 1.7–2.4)

## 2020-01-14 MED ORDER — SODIUM CHLORIDE 0.45 % IV SOLN: INTRAVENOUS | Status: DC

## 2020-01-14 MED ORDER — SENNOSIDES-DOCUSATE SODIUM 8.6-50 MG PO TABS: 1.0000 | ORAL_TABLET | Freq: Two times a day (BID) | ORAL | Status: DC | PRN

## 2020-01-14 NOTE — TOC Initial Note (Addendum)
Transition of Care [facility]) - Initial/Assessment Note    Patient Details  Name: Jodi Snyder MRN: [id number] Date of Birth: 24-May-1932  Transition of Care [facility]) CM/SW Contact:    Marilu Favre, RN Phone Number: 01/14/2020, 10:39 AM  Clinical Narrative:                 Patient from home alone. PT recommendation changed from SNF to HHPT with intermittent supervision.   Patient states her neighbors and step son can provide intermittent supervision and she wants [facility] for [place]. Cory with [facility] can accept referral for HHPT. Messaged MD for orders and face to face   Patient needing 3 in 1 . DME agencies closed today , [employer] will ship 3 in1 to patient.   Expected Discharge Plan: [place] Barriers to Discharge: Continued Medical Work up   Patient Goals and CMS Choice Patient states their goals for this hospitalization and ongoing recovery are:: to return to home CMS Medicare.gov Compare Post Acute Care list provided to:: Patient Choice offered to / list presented to : Patient  Expected Discharge Plan and Services Expected Discharge Plan: [place]   Discharge Planning Services: CM Consult Post Acute Care Choice: [place] arrangements for the past 2 months: Single Family Home                 DME Arranged: N/A DME Agency: NA       HH Arranged: PT HH Agency: [place] Date [place]: 01/14/20 Time [place]: 9937 Representative spoke with at [place]: Tommi Rumps waiting on [place] from attending and orders and face to face  Prior Living Arrangements/Services Living arrangements for the past 2 months: [employer] Lives with:: Self Patient language and need for interpreter reviewed:: Yes Do you feel safe going back to the place where you live?: Yes   SNF  Need for Family Participation in Patient Care: Yes (Comment) Care giver support system in place?: Yes (comment) Current home services:  DME(walker) Criminal Activity/Legal Involvement Pertinent to Current Situation/Hospitalization: No - Comment as needed  Activities of Daily Living Home Assistive Devices/Equipment: Walker (specify type), Eyeglasses ADL Screening (condition at time of admission) Patient's cognitive ability adequate to safely complete daily activities?: Yes Is the patient deaf or have difficulty hearing?: No Does the patient have difficulty seeing, even when wearing glasses/contacts?: No Does the patient have difficulty concentrating, remembering, or making decisions?: No Patient able to express need for assistance with ADLs?: Yes Does the patient have difficulty dressing or bathing?: No Independently performs ADLs?: Yes (appropriate for developmental age) Does the patient have difficulty walking or climbing stairs?: Yes Weakness of Legs: Both Weakness of Arms/Hands: None  Permission Sought/Granted Permission sought to share information with : Case Manager, Family Supports, Customer service manager Permission granted to share information with : No  Share Information with NAME: Aaron Edelman  Permission granted to share info w AGENCY: SNF  Permission granted to share info w Relationship: Joslyn Hy  Permission granted to share info w Contact Information: Aaron Edelman [phone]  Emotional Assessment Appearance:: Appears stated age Attitude/Demeanor/Rapport: Engaged Affect (typically observed): Accepting Orientation: : Oriented to Situation, Oriented to  Time, Oriented to Place, Oriented to Self Alcohol / Substance Use: Not Applicable Psych Involvement: No (comment)  Admission diagnosis:  Chest pain [R07.9] Acute left-sided thoracic back pain [M54.6] Acute weakness [R53.1] Patient Active Problem List   Diagnosis Date Noted  . Acute weakness 01/11/2020  .  Chest pain 01/09/2020  . Acute left-sided thoracic back pain   . Supraventricular tachycardia (HCC) 09/29/2019  . Hypokalemia   . Atrial ectopic  tachycardia (HCC) 08/19/2018  . Asymptomatic microscopic hematuria 01/05/2018  . Paroxysmal atrial fibrillation (HCC) 01/05/2018  . Scoliosis 10/18/2017  . Thoracogenic scoliosis of thoracolumbar region 10/08/2017  . Glaucoma 06/21/2016  . Drug therapy 01/28/2016  . Benign hypertension with CKD (chronic kidney disease) stage III 01/16/2016  . Discoid lupus erythematosus 01/16/2016  . Generalized anxiety disorder 01/16/2016  . Osteoporosis 01/16/2016  . Polymyalgia rheumatica (HCC) 01/16/2016  . Closed fracture of distal end of left radius 11/11/2014  . Dry eye syndrome 08/03/2012  . Other states following surgery of eye and adnexa 04/21/2012  . 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 Low-tension glaucoma, unspecified eye, stage unspecified 04/07/2012  . Pseudophakia of both eyes 04/07/2012   PCP:  Elspeth Cho., MD Pharmacy:   [facility] [phone] - [place], [place] - [address] [place] [id number] Phone: [phone] Fax: [phone]  [facility] - [place], [place] - [address] [place] [address] Phone: [phone] Fax: [phone]     Social Determinants of Health (SDOH) Interventions    Readmission Risk Interventions No flowsheet data found.

## 2020-01-14 NOTE — Care Management Important Message (Signed)
Important Message  Patient Details  Name: Jodi Snyder MRN: [id number] Date of Birth: 01-Mar-1932   Medicare Important Message Given:  Yes - Important Message mailed due to current National Emergency  Verbal consent obtained due to current National Emergency  Relationship to patient: Self Contact Name: [name] Call Date: 01/14/20  Time: 1404 Phone: [phone] Outcome: Spoke with contact Important Message mailed to: Other (must enter comment)(Per patient, additional copy of IM not needed)    [name] 01/14/2020, 2:04 PM

## 2020-01-14 NOTE — Progress Notes (Signed)
Physical Therapy Re-Evaluation and Treatment Patient Details Name: Jodi Snyder MRN: [id number] DOB: [birth year] Today's Date: 01/14/2020    History of Present Illness Pt is an 86 y/o female admitted secondary to chest pain. PMH includes a fib, lupus, scoliosis, and polymyalgia rheumatica.     PT Comments    Pt sitting EoB, reporting she feels much better today. Pt has been ambulating to bathroom by herself over the weekend. Pt continues to have some instability mainly due to scoliosis and resultant R LE weakness. Pt is supervision for transfers. Pt grossly supervision for ambulation of 300 feet without AD, however has occasional misstep warranting min guard for safety. Pt no longer requires SNF level rehab but would benefit from HHPT for strengthening, improved stability and greater endurance. PT will continue to follow acutely.    Follow Up Recommendations  Home health PT;Supervision - Intermittent     Equipment Recommendations  None recommended by PT(already has RW)       Precautions / Restrictions Precautions Precautions: Fall Restrictions Weight Bearing Restrictions: No    Mobility  Bed Mobility               General bed mobility comments: sitting EoB on entry  Transfers Overall transfer level: Needs assistance Equipment used: None Transfers: Sit to/from Stand Sit to Stand: Supervision         General transfer comment: supervision for safety with sit>stand   Ambulation/Gait Ambulation/Gait assistance: Min guard;Supervision Gait Distance (Feet): 350 Feet Assistive device: None Gait Pattern/deviations: Step-through pattern;Decreased stride length;Shuffle Gait velocity: Decreased Gait velocity interpretation: 1.31 - 2.62 ft/sec, indicative of limited community ambulator General Gait Details: supervision for safety throughout ambulation, occasional misstep requiring min guard, unsteadiness likely due to scoliosis and R leg weakness, probably her baseline, pt  reports use of furniture in her house for support, PT recommend use of RW for out in community        Balance Overall balance assessment: Needs assistance Sitting-balance support: No upper extremity supported;Feet supported Sitting balance-Leahy Scale: Fair     Standing balance support: Bilateral upper extremity supported;During functional activity;No upper extremity supported Standing balance-Leahy Scale: Fair Standing balance comment: able to static stand and perform activity in BoS with no UE assist                            Cognition Arousal/Alertness: Awake/alert Behavior During Therapy: WFL for tasks assessed/performed Overall Cognitive Status: Impaired/Different from baseline Area of Impairment: Memory;Following commands;Problem solving;Safety/judgement                       Following Commands: Follows multi-step commands with increased time;Follows multi-step commands consistently     Problem Solving: Requires verbal cues;Requires tactile cues General Comments: pt cognition improved able to follow multistep commands with increased time         General Comments General comments (skin integrity, edema, etc.): VSS on RA, would like to be set up with [facility] she utilized in the past      Pertinent Vitals/Pain Pain Assessment: No/denies pain           PT Goals (current goals can now be found in the care plan section) Acute Rehab PT Goals Patient Stated Goal: go home PT Goal Formulation: With patient Time For Goal Achievement: 01/24/20 Potential to Achieve Goals: Good Progress towards PT goals: Progressing toward goals    Frequency    Min 3X/week  PT Plan Discharge plan needs to be updated       AM-PAC PT "6 Clicks" Mobility   Outcome Measure  Help needed turning from your back to your side while in a flat bed without using bedrails?: None Help needed moving from lying on your back to sitting on the side of a flat bed  without using bedrails?: None Help needed moving to and from a bed to a chair (including a wheelchair)?: None Help needed standing up from a chair using your arms (e.g., wheelchair or bedside chair)?: None Help needed to walk in hospital room?: None Help needed climbing 3-5 steps with a railing? : A Little 6 Click Score: 23    End of Session Equipment Utilized During Treatment: Gait belt Activity Tolerance: Patient limited by fatigue Patient left: in bed;with call bell/phone within reach;with family/visitor present(sitting EoB) Nurse Communication: Mobility status PT Visit Diagnosis: Unsteadiness on feet (R26.81);Muscle weakness (generalized) (M62.81)     Time: 2505-3976 PT Time Calculation (min) (ACUTE ONLY): 18 min  Charges:                        Benjamine Mola B. Migdalia Dk PT, DPT Acute Rehabilitation Services Pager [phone] Office [phone]    [place] 01/14/2020, 10:09 AM

## 2020-01-14 NOTE — Progress Notes (Signed)
Occupational Therapy Treatment Patient Details Name: Jodi Snyder MRN: [id number] DOB: 03-24-1932 Today's Date: 01/14/2020    History of present illness Pt is an 86 y/o female admitted secondary to chest pain. PMH includes a fib, lupus, scoliosis, and polymyalgia rheumatica.    OT comments  Pt making steady progress towards OT goals this session. Session focus on functional mobility with no AD, UB ADLs and toileting tasks. Overall, pt require gross supervision for household distance functional mobility with no AD with pt able to reach out BOS to reach ADL items. Pt requires supervision for UB ADLs at sink and for  toileting. Education provided on using 3n1 over regular toilet to increase toilet seat height as ECS. Continue to recommend DC plan below as pt does not have 24/7 assist available and feel pt would benefit from 24/7 care d/t mild cognitive conerns listed below. Added 3n1 to DC recs below, will let OTR know about change in POC.    Follow Up Recommendations  SNF;Other (comment)(Would prefer HHOT if pt has 24/7 supervision available)    Equipment Recommendations  3 in 1 bedside commode    Recommendations for Other Services      Precautions / Restrictions Precautions Precautions: Fall Restrictions Weight Bearing Restrictions: No       Mobility Bed Mobility   Bed Mobility: Supine to Sit     Supine to sit: Supervision     General bed mobility comments: supervision for safety  Transfers Overall transfer level: Needs assistance Equipment used: None Transfers: Sit to/from Stand Sit to Stand: Supervision         General transfer comment: supervision for safety with sit>stand; x2 trials from EOB and toilet;education on using 3n1 at home over regular toilets to increase toilet height    Balance Overall balance assessment: Needs assistance Sitting-balance support: No upper extremity supported;Feet supported Sitting balance-Leahy Scale: Fair     Standing balance  support: No upper extremity supported;During functional activity Standing balance-Leahy Scale: Fair Standing balance comment: able to reach out of BOS to gather items from closet and even on floor with close supervision                           ADL either performed or assessed with clinical judgement   ADL Overall ADL's : Needs assistance/impaired     Grooming: Wash/dry hands;Standing;Supervision/safety                   Toilet Transfer: Supervision/safety;Ambulation;Regular Toilet;Grab bars   Toileting- Clothing Manipulation and Hygiene: Independent;Sitting/lateral lean     Tub/Shower Transfer Details (indicate cue type and reason): pt reports walkin showr with seat at home Functional mobility during ADLs: Supervision/safety General ADL Comments: pt with improvements in functional mobility this session needing gross supervision for household distance functional mobility with no AD. pt completed toileting and UB ADLs with supervision overall. noted some cognitive impairments realted to [facility] and safety awareness     Vision Baseline Vision/History: Macular Degeneration;Wears glasses Wears Glasses: At all times Patient Visual Report: No change from baseline     Perception     Praxis      Cognition Arousal/Alertness: Awake/alert Behavior During Therapy: WFL for tasks assessed/performed Overall Cognitive Status: Impaired/Different from baseline Area of Impairment: Memory;Following commands;Problem solving;Safety/judgement                     Memory: Decreased short-term memory Following Commands: Follows multi-step commands with increased time;Follows multi-step commands  consistently Safety/Judgement: Decreased awareness of safety   Problem Solving: Requires verbal cues;Requires tactile cues General Comments: 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 requires cues for safety awarness and problem solving noted when packing up  items from closet        Exercises     Shoulder Instructions       General Comments pt reports friend bring her groceries and she does all her own cooking with seat available in kitchen to sit for ECS; pts son works close by but unable to assist 24/7    Pertinent Vitals/ Pain       Pain Assessment: No/denies pain  Home Living                                          Prior Functioning/Environment              Frequency  Min 2X/week        Progress Toward Goals  OT Goals(current goals can now be found in the care plan section)  Progress towards OT goals: Progressing toward goals  Acute Rehab OT Goals Patient Stated Goal: go home  Plan Discharge plan remains appropriate;Frequency remains appropriate    Co-evaluation                 AM-PAC OT "6 Clicks" Daily Activity     Outcome Measure   Help from another person eating meals?: None Help from another person taking care of personal grooming?: None Help from another person toileting, which includes using toliet, bedpan, or urinal?: None Help from another person bathing (including washing, rinsing, drying)?: A Little Help from another person to put on and taking off regular upper body clothing?: None Help from another person to put on and taking off regular lower body clothing?: A Little 6 Click Score: 22    End of Session Equipment Utilized During Treatment: Other (comment)(3n1)  OT Visit Diagnosis: Unsteadiness on feet (R26.81);Muscle weakness (generalized) (M62.81);Pain   Activity Tolerance Patient tolerated treatment well   Patient Left in bed;with call bell/phone within reach;with nursing/sitter in room   Nurse Communication Mobility status;Other (comment)        Time: 9371-6967 OT Time Calculation (min): 20 min  Charges: OT General Charges $OT Visit: 1 Visit OT Treatments $Self Care/Home Management : 8-22 mins  Lanier Clam., COTA/L Acute Rehabilitation  Services [phone] [place] 01/14/2020, 1:02 PM

## 2020-01-14 NOTE — Discharge Summary (Signed)
Physician Discharge Summary  Jodi Snyder SHF:[id number] DOB: Jan 17, 1932 DOA: 01/09/2020  PCP: [name]., MD  Admit date: 01/09/2020 Discharge date: 01/14/2020  Admitted From: Home Disposition: Home  Recommendations for Outpatient Follow-up:  1. Follow ups as below. 2. Please obtain CBC/BMP/Mag at follow up 3. Please follow up on the following pending results: None  Home Health: PT/OT Equipment/Devices: Patient has rolling walker at home.  Discharge Condition: Stable CODE STATUS: DNR/DNI  Follow-up Information    [facility] Follow up.   Specialty: Home Health Services Contact information: [place] [address] [phone]        [name]., MD. Schedule an appointment as soon as possible for a visit in 1 week(s).   Specialty: Internal Medicine Contact information: [address] [name] [address] [phone]            Hospital Course: 86 year old female with history of A. fib, HTN, PMR and discoid lupus who presented to [facility] ED with chest pain, and admitted for ACS rule out.  In ED, CXR without acute finding.  EKG with mild ST changes laterally but improved on repeat.  D-dimer and high-sensitivity troponin negative.  Admitted for ACS rule out.  Cardiology consulted.  She also had MRI of the spine ordered.   The next day, chest pain improved significantly.  MRI without acute finding.  Cardiology recommended Myoview which was reported as low risk.   Initially, therapy recommended SNF.  However, patient's acute weakness and debility improved.  Reevaluated by therapy and discharged home with home health.  She has appropriate DME.  Hospital course complicated by AKI that has improved with IVF.  Lisinopril discontinued on discharge.  See individual problem list below for more hospital course.  Discharge Diagnoses:  Atypical chest pain: Initial EKG with mild ST changes in lateral  leads that has improved on repeat EKG.  Serial troponin negative. Nuclear stress test negative. Cardiology signed off.  Chest pain resolved as well.  Paroxysmal A. Fib: Rate controlled. -Continue Cardizem and Eliquis.  History of PMR and discoid lupus- -continue home prednisone and Plaquenil  Essential hypertension: Normotensive. -Continue home Cardizem.  Discontinued lisinopril in the setting of AKI  AKI on CKD-3A: Baseline Cr~1.0-1.1> 1.4 (admit)> 1.16> 1.57>1.35 -Discontinued lisinopril on discharge -Check renal function in a week  Anxiety/chronic pain/scoliosis/insomnia -Continue home Xanax and Ultracet-not a great combination given her age -Recommend addressing this  Debility/physical deconditioning: Therapy initially recommended SNF.  However, patient's acute weakness and debility improved.  Reevaluated by therapy.  Discharge home with home health PT/OT.  She has appropriate DME's.  Severe malnutrition/FTT: BMI of 16.83 and significant muscle mass and subcu fat loss.  Poor oral intake Nutrition Problem: Increased nutrient needs Etiology: chronic illness(lupus)  Signs/Symptoms: estimated needs  Interventions: Ensure Enlive (each supplement provides 350kcal and 20 grams of protein), MVI      Discharge Exam: Vitals:   01/14/20 0722 01/14/20 0957  BP: 135/82 (!) 131/95  Pulse: 79   Resp:    Temp: (!) 97.5 F (36.4 C)   SpO2: 100%     GENERAL: Frail looking elderly female.  No apparent distress.   Nontoxic. HEENT: MMM.  Vision and hearing grossly intact.  NECK: Supple.  No apparent JVD.  RESP: On room air.  No IWOB.  Fair aeration bilaterally. CVS:  RRR. Heart sounds normal.  ABD/GI/GU: Bowel sounds present. Soft. Non tender.  MSK/EXT:  Moves extremities.  Significant muscle mass  and subcu fat loss. SKIN: no apparent skin lesion or wound NEURO: Awake, alert and oriented appropriately.  No apparent focal neuro deficit. PSYCH: Calm. Normal  affect.  Discharge Instructions  Discharge Instructions    Call MD for:   Complete by: As directed    Significant chest pain   Call MD for:  difficulty breathing, headache or visual disturbances   Complete by: As directed    Call MD for:  extreme fatigue   Complete by: As directed    Call MD for:  persistant dizziness or light-headedness   Complete by: As directed    Call MD for:  severe uncontrolled pain   Complete by: As directed    Diet - low sodium heart healthy   Complete by: As directed    Discharge instructions   Complete by: As directed    It has been a pleasure taking care of you! You were hospitalized with chest pain and weakness.  After the test is we have done, it seems unlikely that your chest pain is related to your heart or your lung.  It is also reassuring that your pain has resolved and your weakness has improved.  We are discharging you with home health therapy for therapeutic exercise and help you get stronger.  Carefully review your new medication list and the instructions on how to take them as there could be some changes during this hospitalization.  Contact your primary care doctor for refills on your medications.  Please follow-up with your primary care doctor in 1 to 2 weeks.   Take care,   Increase activity slowly   Complete by: As directed      Allergies as of 01/14/2020      Reactions   Penicillins Hives, Swelling   Has patient had a PCN reaction causing immediate rash, facial/tongue/throat swelling, SOB or lightheadedness with hypotension: Yes Has patient had a PCN reaction causing severe rash involving mucus membranes or skin necrosis: No Has patient had a PCN reaction that required hospitalization: No Has patient had a PCN reaction occurring within the last 10 years: No If all of the above answers are "NO", then may proceed with Cephalosporin use.   Brimonidine Other (See Comments)   Don't remember   Other    Nuts causes blisters No  spices coffee   Penicillin G Other (See Comments)   Timolol Maleate Other (See Comments)   Pt becomes faint   Sulfa Antibiotics Rash   Sulfasalazine Rash      Medication List    STOP taking these medications   Biotin 5000 5 MG Caps Generic drug: Biotin   diltiazem 30 MG tablet Commonly known as: Cardizem   lisinopril 10 MG tablet Commonly known as: ZESTRIL   metroNIDAZOLE 0.75 % cream Commonly known as: METROCREAM     TAKE these medications   ALPRAZolam 0.5 MG tablet Commonly known as: XANAX Take 0.25 mg by mouth 2 (two) times daily.   brinzolamide 1 % ophthalmic suspension Commonly known as: AZOPT Place 1 drop into both eyes 3 (three) times daily.   cycloSPORINE 0.05 % ophthalmic emulsion Commonly known as: RESTASIS Place 1 drop into both eyes 2 (two) times daily.   diltiazem 120 MG 24 hr capsule Commonly known as: CARDIZEM CD Take 1 capsule (120 mg total) by mouth daily.   Eliquis 2.5 MG Tabs tablet Generic drug: apixaban TAKE 1 TABLET BY MOUTH TWICE DAILY What changed: how much to take   esomeprazole 40 MG capsule Commonly known  as: NEXIUM Take 40 mg by mouth daily.   ferrous sulfate 325 (65 FE) MG tablet Take 325 mg by mouth daily.   hydroxychloroquine 200 MG tablet Commonly known as: PLAQUENIL Take 200 mg by mouth daily.   ICAPS AREDS 2 PO Take 2 capsules by mouth 2 (two) times daily.   ketorolac 0.4 % Soln Commonly known as: ACULAR Apply 1 drop to eye 2 (two) times daily.   predniSONE 5 MG tablet Commonly known as: DELTASONE Take 5 mg by mouth daily.   Rocklatan 0.02-0.005 % Soln Generic drug: Netarsudil-Latanoprost Apply 1 drop to eye at bedtime.   senna-docusate 8.6-50 MG tablet Commonly known as: Senokot-S Take 1 tablet by mouth 2 (two) times daily as needed for moderate constipation.   traMADol-acetaminophen 37.5-325 MG tablet Commonly known as: ULTRACET Take 1 tablet by mouth 2 (two) times daily.        Consultations:  Cardiology  Procedures/Studies:  Nuclear stress test-low risk   DG Chest 2 View  Result Date: 01/09/2020 CLINICAL DATA:  Mid left chest pain since last night. EXAM: CHEST - 2 VIEW COMPARISON:  09/29/2019 FINDINGS: Normal sized heart. Mildly tortuous and partially calcified thoracic aorta. The lungs remain hyperexpanded with a stable small calcified granuloma at the right lung base. Biapical pleural and parenchymal scarring has not changed significantly, including small areas of calcification. Interval possible calcified pleural plaque underneath an EKG lead at the superior aspect of the left lung. Diffuse osteopenia. Mild to moderate scoliosis. IMPRESSION: 1. No acute abnormality. 2. Stable changes of COPD. 3. Interval possible calcified pleural plaque underneath an EKG lead at the superior aspect of the left lung. Electronically Signed   By: Beckie Salts M.D.   On: 01/09/2020 12:46   DG Ribs Unilateral Left  Result Date: 01/09/2020 CLINICAL DATA:  Left-sided rib pain EXAM: LEFT RIBS - 2 VIEW COMPARISON:  Chest x-ray 01/09/2020 FINDINGS: No fracture or other bone lesions are seen involving the ribs. IMPRESSION: Negative. Electronically Signed   By: Jasmine Pang M.D.   On: 01/09/2020 22:45   MR THORACIC SPINE WO CONTRAST  Result Date: 01/09/2020 CLINICAL DATA:  Mid back pain EXAM: MRI THORACIC SPINE WITHOUT CONTRAST TECHNIQUE: Multiplanar, multisequence MR imaging of the thoracic spine was performed. No intravenous contrast was administered. COMPARISON:  None. FINDINGS: Alignment:  Right convex scoliosis with apex at T11 Vertebrae: No fracture, evidence of discitis, or bone lesion. Cord: No focal disc herniation. No spinal canal or neural foraminal stenosis. Paraspinal and other soft tissues: Biapical opacities, likely scarring. Disc levels: No spinal canal or neural foraminal stenosis. IMPRESSION: 1. No acute abnormality of the thoracic spine. 2. Right convex scoliosis with  apex at T11. 3. No spinal canal or neural foraminal stenosis. Electronically Signed   By: Deatra Robinson M.D.   On: 01/09/2020 22:53   NM Myocar Multi W/Spect W/Wall Motion / EF  Result Date: 01/11/2020  There was no ST segment deviation noted during stress.  Nuclear stress EF: 65%. The left ventricular ejection fraction is normal (55-65%).  This is a low risk study. No evidence of ischemia or previous infarction  The study is normal.         The results of significant diagnostics from this hospitalization (including imaging, microbiology, ancillary and laboratory) are listed below for reference.     Microbiology: Recent Results (from the past 240 hour(s))  SARS Coronavirus 2 by RT PCR (hospital order, performed in [facility] hospital lab) Nasopharyngeal Nasopharyngeal Swab  Status: None   Collection Time: 01/09/20  2:53 PM   Specimen: Nasopharyngeal Swab  Result Value Ref Range Status   SARS Coronavirus 2 NEGATIVE NEGATIVE Final    Comment: (NOTE) SARS-CoV-2 target nucleic acids are NOT DETECTED. The SARS-CoV-2 RNA is generally detectable in upper and lower respiratory specimens during the acute phase of infection. The lowest concentration of SARS-CoV-2 viral copies this assay can detect is 250 copies / mL. A negative result does not preclude SARS-CoV-2 infection and should not be used as the sole basis for treatment or other patient management decisions.  A negative result may occur with improper specimen collection / handling, submission of specimen other than nasopharyngeal swab, presence of viral mutation(s) within the areas targeted by this assay, and inadequate number of viral copies (<250 copies / mL). A negative result must be combined with clinical observations, patient history, and epidemiological information. Fact Sheet for Patients:   BoilerBrush.com.cyhttps://www.fda.gov/media/136312/download Fact Sheet for Healthcare Providers: https://pope.com/https://www.fda.gov/media/136313/download This  test is not yet approved or cleared  by the [place] FDA and has been authorized for detection and/or diagnosis of SARS-CoV-2 by FDA under an Emergency Use Authorization (EUA).  This EUA will remain in effect (meaning this test can be used) for the duration of the COVID-19 declaration under Section 564(b)(1) of the Act, 21 U.S.C. section 360bbb-3(b)(1), unless the authorization is terminated or revoked sooner. Performed at [facility] [place], [address] [place], [place] [id number]      Labs: BNP (last 3 results) No results for input(s): BNP in the last 8760 hours. Basic Metabolic Panel: Recent Labs  Lab 01/09/20 1238 01/11/20 0331 01/12/20 0722 01/13/20 0425 01/14/20 0344  NA 140 142 143 141 143  K 4.6 3.6 3.7 4.1 3.9  CL 102 109 111 107 112*  CO2 26 25 22 25 25   GLUCOSE 99 87 79 92 84  BUN 30* 25* 25* 34* 39*  CREATININE 1.40* 1.21* 1.16* 1.57* 1.35*  CALCIUM 9.2 8.9 9.3 9.4 8.7*  MG  --  1.9 1.9  --  1.9  PHOS  --  3.8 3.3 4.1 3.4   Liver Function Tests: Recent Labs  Lab 01/11/20 0331 01/12/20 0722 01/13/20 0425 01/14/20 0344  ALBUMIN 3.5 3.7 3.5 3.2*   No results for input(s): LIPASE, AMYLASE in the last 168 hours. No results for input(s): AMMONIA in the last 168 hours. CBC: Recent Labs  Lab 01/09/20 1238 01/10/20 0720 01/11/20 0331 01/12/20 0722  WBC 6.2 6.1 5.1 5.6  NEUTROABS 5.2  --   --   --   HGB 14.4 14.3 13.4 14.2  HCT 44.7 44.1 41.4 43.6  MCV 101.6* 100.0 100.7* 99.5  PLT 151 146* 147* 153   Cardiac Enzymes: No results for input(s): CKTOTAL, CKMB, CKMBINDEX, TROPONINI in the last 168 hours. BNP: Invalid input(s): POCBNP CBG: No results for input(s): GLUCAP in the last 168 hours. D-Dimer No results for input(s): DDIMER in the last 72 hours. Hgb A1c No results for input(s): HGBA1C in the last 72 hours. Lipid Profile No results for input(s): CHOL, HDL, LDLCALC, TRIG, CHOLHDL, LDLDIRECT in the last 72 hours. Thyroid function  studies No results for input(s): TSH, T4TOTAL, T3FREE, THYROIDAB in the last 72 hours.  Invalid input(s): FREET3 Anemia work up No results for input(s): VITAMINB12, FOLATE, FERRITIN, TIBC, IRON, RETICCTPCT in the last 72 hours. Urinalysis    Component Value Date/Time   COLORURINE STRAW (A) 08/19/2018 0838   APPEARANCEUR CLEAR 08/19/2018 0838   LABSPEC 1.005  08/19/2018 0838   PHURINE 9.0 (H) 08/19/2018 0838   GLUCOSEU NEGATIVE 08/19/2018 0838   HGBUR NEGATIVE 08/19/2018 0838   BILIRUBINUR NEGATIVE 08/19/2018 0838   KETONESUR NEGATIVE 08/19/2018 0838   PROTEINUR NEGATIVE 08/19/2018 0838   NITRITE NEGATIVE 08/19/2018 0838   LEUKOCYTESUR NEGATIVE 08/19/2018 0838   Sepsis Labs Invalid input(s): PROCALCITONIN,  WBC,  LACTICIDVEN   Time coordinating discharge: 35 minutes  SIGNED:  Almon Hercules, MD  [facility] 01/14/2020, 10:45 AM  If 7PM-7AM, please contact night-coverage [email or web address] Password [email or web address]

## 2020-01-25 ENCOUNTER — Telehealth: Payer: Self-pay | Admitting: Student

## 2020-01-25 NOTE — Telephone Encounter (Signed)
Patient calling to request her friend Harriett Sine  come with her to her appointment 6/14, because she just got out of the hospital and needs assistance walking.

## 2020-01-27 NOTE — Progress Notes (Signed)
PCP:  [name]., MD Primary Cardiologist: Will Meredith Leeds, MD Electrophysiologist: Constance Haw, MD   Jodi Snyder is a 86 y.o. female seen today for Will Meredith Leeds, MD for post hospital follow up.  Since discharge from hospital the patient reports doing OK. No further chest pain. She has not felt atrial fibrillation in some time. She has had an exacerbation of chronic back pain since she was lying in the hospital for so long. Otherwise, she is doing well. Marland Kitchen She was admitted 5/26 - 5/31 with atypical chest pain. Work up was unremarkable with negative troponins and stress test. Thought to likely be musculoskeletal.    she denies chest pain, palpitations, dyspnea, PND, orthopnea, nausea, vomiting, dizziness, syncope, edema, weight gain, or early satiety.  Past Medical History:  Diagnosis Date  . Atrial fibrillation ([place])   . Hypertension   . Immune system disorder ([place])   . Polymyalgia ([place])    Past Surgical History:  Procedure Laterality Date  . ABDOMINAL HYSTERECTOMY    . BLADDER SURGERY    . OOPHORECTOMY      Current Outpatient Medications  Medication Sig Dispense Refill  . ALPRAZolam (XANAX) 0.5 MG tablet Take 0.25 mg by mouth 2 (two) times daily.     . brinzolamide (AZOPT) 1 % ophthalmic suspension Place 1 drop into both eyes 3 (three) times daily.     . cycloSPORINE (RESTASIS) 0.05 % ophthalmic emulsion Place 1 drop into both eyes 2 (two) times daily.     Marland Kitchen diltiazem (CARDIZEM CD) 120 MG 24 hr capsule Take 1 capsule (120 mg total) by mouth daily. 90 capsule 3  . ELIQUIS 2.5 MG TABS tablet TAKE 1 TABLET BY MOUTH TWICE DAILY 60 tablet 5  . esomeprazole (NEXIUM) 40 MG capsule Take 40 mg by mouth daily.     . ferrous sulfate 325 (65 FE) MG tablet Take 325 mg by mouth daily.    . hydroxychloroquine (PLAQUENIL) 200 MG tablet Take 200 mg by mouth daily.    Marland Kitchen ketorolac (ACULAR) 0.4 % SOLN Apply 1 drop to eye 2 (two) times daily.    Marland Kitchen lisinopril (ZESTRIL) 10 MG  tablet Take 5 mg by mouth daily.    . Multiple Vitamins-Minerals (ICAPS AREDS 2 PO) Take 2 capsules by mouth 2 (two) times daily.    . predniSONE (DELTASONE) 5 MG tablet Take 5 mg by mouth daily.     Marland Kitchen ROCKLATAN 0.02-0.005 % SOLN Apply 1 drop to eye at bedtime.    . senna-docusate (SENOKOT-S) 8.6-50 MG tablet Take 1 tablet by mouth 2 (two) times daily as needed for moderate constipation.    . traMADol-acetaminophen (ULTRACET) 37.5-325 MG tablet Take 1 tablet by mouth 2 (two) times daily.      No current facility-administered medications for this visit.    Allergies  Allergen Reactions  . Penicillins Hives and Swelling    Has patient had a PCN reaction causing immediate rash, facial/tongue/throat swelling, SOB or lightheadedness with hypotension: Yes Has patient had a PCN reaction causing severe rash involving mucus membranes or skin necrosis: No Has patient had a PCN reaction that required hospitalization: No Has patient had a PCN reaction occurring within the last 10 years: No If all of the above answers are "NO", then may proceed with Cephalosporin use.   . Brimonidine Other (See Comments)    Don't remember  . Other     Nuts causes blisters No spices coffee  . Penicillin G Other (See Comments)  .  Timolol Maleate Other (See Comments)    Pt becomes faint  . Sulfa Antibiotics Rash  . Sulfasalazine Rash    Social History   Socioeconomic History  . Marital status: Widowed    Spouse name: Not on file  . Number of children: Not on file  . Years of education: Not on file  . Highest education level: Not on file  Occupational History  . Not on file  Tobacco Use  . Smoking status: Former Games developer  . Smokeless tobacco: Never Used  Vaping Use  . Vaping Use: Never used  Substance and Sexual Activity  . Alcohol use: No  . Drug use: No  . Sexual activity: Not on file  Other Topics Concern  . Not on file  Social History Narrative  . Not on file   Social Determinants of Health    Financial Resource Strain:   . Difficulty of Paying Living Expenses:   Food Insecurity:   . Worried About Programme researcher, broadcasting/film/video in the Last Year:   . Barista in the Last Year:   Transportation Needs:   . Freight forwarder (Medical):   Marland Kitchen Lack of Transportation (Non-Medical):   Physical Activity:   . Days of Exercise per Week:   . Minutes of Exercise per Session:   Stress:   . Feeling of Stress :   Social Connections:   . Frequency of Communication with Friends and Family:   . Frequency of Social Gatherings with Friends and Family:   . Attends Religious Services:   . Active Member of Clubs or Organizations:   . Attends Banker Meetings:   Marland Kitchen Marital Status:   Intimate Partner Violence:   . Fear of Current or Ex-Partner:   . Emotionally Abused:   Marland Kitchen Physically Abused:   . Sexually Abused:      Review of Systems: All other systems reviewed and are otherwise negative except as noted above.  Physical Exam: Vitals:   01/28/20 1223  BP: 130/72  Pulse: 77  SpO2: 98%  Weight: 98 lb 12.8 oz (44.8 kg)  Height: 5\' 3"  (1.6 m)    GEN- The patient is elderly appearing, alert and oriented x 3 today.   HEENT: normocephalic, atraumatic; sclera clear, conjunctiva pink; hearing intact; oropharynx clear; neck supple, no JVP Lymph- no cervical lymphadenopathy Lungs- Clear to ausculation bilaterally, normal work of breathing.  No wheezes, rales, rhonchi Heart- Regular rate and rhythm, no murmurs, rubs or gallops, PMI not laterally displaced GI- soft, non-tender, non-distended, bowel sounds present, no hepatosplenomegaly Extremities- no clubbing, cyanosis, or edema; DP/PT/radial pulses 2+ bilaterally MS- no significant deformity or atrophy Skin- warm and dry, no rash or lesion Psych- euthymic mood, full affect Neuro- strength and sensation are intact  EKG is not ordered.   Additional studies reviewed include: Recent admission notes, previous EP office notes.  Recent stress test and labs.   Assessment and Plan:  1. Paroxysmal atrial fib Continue Eliquis for CHA2DS2VASC of at least 4   Continue diltiazem daily dosing  2. HTN Continue current medications Lisinopril was held in hospital, then resumed. Labs 6/10 stable on care- everywhere.  3. Long R-P tachycardia Previously seen on ECG during ER visit. She has retrograde conduction Continue CCB  4. Atypical chest pain No ECG changes noted Myoview 01/11/20 with normal EF and no evidence of ischemia or prior infarct  Stable from cardiac perspective. RTC 6 months. Sooner with issues.   01/13/20, PA-C  01/28/20  12:26 PM

## 2020-01-28 ENCOUNTER — Ambulatory Visit: Payer: Medicare PPO | Admitting: Student

## 2020-01-28 ENCOUNTER — Other Ambulatory Visit: Payer: Self-pay

## 2020-01-28 ENCOUNTER — Encounter: Payer: Self-pay | Admitting: Student

## 2020-01-28 VITALS — BP 130/72 | HR 77 | Ht 63.0 in | Wt 98.8 lb

## 2020-01-28 DIAGNOSIS — I480 Paroxysmal atrial fibrillation: Secondary | ICD-10-CM

## 2020-01-28 DIAGNOSIS — R079 Chest pain, unspecified: Secondary | ICD-10-CM

## 2020-01-28 DIAGNOSIS — I10 Essential (primary) hypertension: Secondary | ICD-10-CM | POA: Diagnosis not present

## 2020-01-28 NOTE — Telephone Encounter (Signed)
Lpm informing her that it will be ok for her friend Harriett Sine) to accompany her to the appointment with Mardelle Matte today, 6/14.

## 2020-01-28 NOTE — Patient Instructions (Addendum)
Medication Instructions:  none *If you need a refill on your cardiac medications before your next appointment, please call your pharmacy*   Lab Work: none If you have labs (blood work) drawn today and your tests are completely normal, you will receive your results only by: Marland Kitchen MyChart Message (if you have MyChart) OR . A paper copy in the mail If you have any lab test that is abnormal or we need to change your treatment, we will call you to review the results.   Testing/Procedures: none   Follow-Up: At [facility], you and your health needs are our priority.  As part of our continuing mission to provide you with exceptional heart care, we have created designated Provider Care Teams.  These Care Teams include your primary Cardiologist (physician) and Advanced Practice Providers (APPs -  Physician Assistants and Nurse Practitioners) who all work together to provide you with the care you need, when you need it.  We recommend signing up for the patient portal called "MyChart".  Sign up information is provided on this After Visit Summary.  MyChart is used to connect with patients for Virtual Visits (Telemedicine).  Patients are able to view lab/test results, encounter notes, upcoming appointments, etc.  Non-urgent messages can be sent to your provider as well.   To learn more about what you can do with MyChart, go to [email or web address].    Your next appointment:   6 month(s)  The format for your next appointment:   Either In Person or Virtual  Provider:   Dr Elberta Fortis   Other Instructions

## 2020-03-27 ENCOUNTER — Other Ambulatory Visit (HOSPITAL_COMMUNITY): Payer: Self-pay | Admitting: Cardiology

## 2020-03-27 NOTE — Telephone Encounter (Signed)
Eliquis 2.5mg  refill request received. Patient is 86 years old, weight-44.8kg, Crea-1.35 on 01/14/2020, Diagnosis-Afib, and last seen by Otilio Saber on 01/28/2020. Dose is appropriate based on dosing criteria. Will send in refill to requested pharmacy.

## 2020-05-22 ENCOUNTER — Telehealth: Payer: Self-pay | Admitting: Cardiology

## 2020-05-22 NOTE — Telephone Encounter (Signed)
Forwarding to Dr. Camnitz for his FYI 

## 2020-05-22 NOTE — Telephone Encounter (Signed)
New message    Calling to let Dr Elberta Fortis know that she had to find a cardiologist closer to her home since she cannot drive anymore.  Patient said please thank Dr Elberta Fortis for taking great care of her.  She got a letter stating that it was time to schedule an appt.

## 2020-07-24 ENCOUNTER — Other Ambulatory Visit: Payer: Self-pay | Admitting: Cardiology

## 2020-09-23 ENCOUNTER — Other Ambulatory Visit: Payer: Self-pay | Admitting: Cardiology

## 2021-04-06 ENCOUNTER — Other Ambulatory Visit (HOSPITAL_COMMUNITY): Payer: Self-pay | Admitting: Cardiology

## 2021-05-17 ENCOUNTER — Other Ambulatory Visit: Payer: Self-pay | Admitting: Cardiology

## 2021-06-25 ENCOUNTER — Observation Stay (HOSPITAL_COMMUNITY)
Admission: EM | Admit: 2021-06-25 | Discharge: 2021-06-27 | Disposition: A | Discharge status: Home / Self Care | Payer: Medicare PPO | Attending: Family Medicine | Admitting: Family Medicine

## 2021-06-25 ENCOUNTER — Emergency Department (HOSPITAL_COMMUNITY): Payer: Medicare PPO

## 2021-06-25 DIAGNOSIS — N183 Chronic kidney disease, stage 3 unspecified: Secondary | ICD-10-CM | POA: Diagnosis not present

## 2021-06-25 DIAGNOSIS — S32018A Other fracture of first lumbar vertebra, initial encounter for closed fracture: Principal | ICD-10-CM | POA: Insufficient documentation

## 2021-06-25 DIAGNOSIS — Z87891 Personal history of nicotine dependence: Secondary | ICD-10-CM | POA: Insufficient documentation

## 2021-06-25 DIAGNOSIS — W19XXXA Unspecified fall, initial encounter: Secondary | ICD-10-CM | POA: Diagnosis not present

## 2021-06-25 DIAGNOSIS — Z79899 Other long term (current) drug therapy: Secondary | ICD-10-CM | POA: Insufficient documentation

## 2021-06-25 DIAGNOSIS — G9340 Encephalopathy, unspecified: Secondary | ICD-10-CM | POA: Diagnosis present

## 2021-06-25 DIAGNOSIS — Z7901 Long term (current) use of anticoagulants: Secondary | ICD-10-CM | POA: Diagnosis not present

## 2021-06-25 DIAGNOSIS — S32010A Wedge compression fracture of first lumbar vertebra, initial encounter for closed fracture: Secondary | ICD-10-CM | POA: Diagnosis present

## 2021-06-25 DIAGNOSIS — Z20822 Contact with and (suspected) exposure to covid-19: Secondary | ICD-10-CM | POA: Diagnosis not present

## 2021-06-25 DIAGNOSIS — I480 Paroxysmal atrial fibrillation: Secondary | ICD-10-CM | POA: Diagnosis present

## 2021-06-25 DIAGNOSIS — M545 Low back pain, unspecified: Secondary | ICD-10-CM

## 2021-06-25 DIAGNOSIS — K5903 Drug induced constipation: Secondary | ICD-10-CM

## 2021-06-25 DIAGNOSIS — I5030 Unspecified diastolic (congestive) heart failure: Secondary | ICD-10-CM | POA: Insufficient documentation

## 2021-06-25 DIAGNOSIS — M353 Polymyalgia rheumatica: Secondary | ICD-10-CM | POA: Diagnosis present

## 2021-06-25 DIAGNOSIS — I130 Hypertensive heart and chronic kidney disease with heart failure and stage 1 through stage 4 chronic kidney disease, or unspecified chronic kidney disease: Secondary | ICD-10-CM | POA: Diagnosis not present

## 2021-06-25 LAB — CBC WITH DIFFERENTIAL/PLATELET
Abs Immature Granulocytes: 0.02 10*3/uL (ref 0.00–0.07)
Basophils Absolute: 0 10*3/uL (ref 0.0–0.1)
Basophils Relative: 1 %
Eosinophils Absolute: 0 10*3/uL (ref 0.0–0.5)
Eosinophils Relative: 0 %
HCT: 39.9 % (ref 36.0–46.0)
Hemoglobin: 12.6 g/dL (ref 12.0–15.0)
Immature Granulocytes: 0 %
Lymphocytes Relative: 15 %
Lymphs Abs: 0.9 10*3/uL (ref 0.7–4.0)
MCH: 31.7 pg (ref 26.0–34.0)
MCHC: 31.6 g/dL (ref 30.0–36.0)
MCV: 100.5 fL — ABNORMAL HIGH (ref 80.0–100.0)
Monocytes Absolute: 0.5 10*3/uL (ref 0.1–1.0)
Monocytes Relative: 8 %
Neutro Abs: 4.6 10*3/uL (ref 1.7–7.7)
Neutrophils Relative %: 76 %
Platelets: 226 10*3/uL (ref 150–400)
RBC: 3.97 MIL/uL (ref 3.87–5.11)
RDW: 13.2 % (ref 11.5–15.5)
WBC: 6 10*3/uL (ref 4.0–10.5)
nRBC: 0 % (ref 0.0–0.2)

## 2021-06-25 LAB — URINALYSIS, ROUTINE W REFLEX MICROSCOPIC
Bilirubin Urine: NEGATIVE
Glucose, UA: NEGATIVE mg/dL
Hgb urine dipstick: NEGATIVE
Ketones, ur: 5 mg/dL — AB
Leukocytes,Ua: NEGATIVE
Nitrite: NEGATIVE
Protein, ur: NEGATIVE mg/dL
Specific Gravity, Urine: 1.006 (ref 1.005–1.030)
pH: 7 (ref 5.0–8.0)

## 2021-06-25 LAB — CBG MONITORING, ED: Glucose-Capillary: 95 mg/dL (ref 70–99)

## 2021-06-25 LAB — BASIC METABOLIC PANEL
Anion gap: 9 (ref 5–15)
BUN: 19 mg/dL (ref 8–23)
CO2: 28 mmol/L (ref 22–32)
Calcium: 8.9 mg/dL (ref 8.9–10.3)
Chloride: 105 mmol/L (ref 98–111)
Creatinine, Ser: 1.47 mg/dL — ABNORMAL HIGH (ref 0.44–1.00)
GFR, Estimated: 34 mL/min — ABNORMAL LOW (ref >60)
Glucose, Bld: 85 mg/dL (ref 70–99)
Potassium: 3.6 mmol/L (ref 3.5–5.1)
Sodium: 142 mmol/L (ref 135–145)

## 2021-06-25 MED ORDER — LISINOPRIL 10 MG PO TABS: 5.0000 mg | ORAL_TABLET | Freq: Every day | ORAL | Status: DC

## 2021-06-25 MED ORDER — APIXABAN 2.5 MG PO TABS
2.5000 mg | ORAL_TABLET | Freq: Two times a day (BID) | ORAL | Status: DC
  Administered 2021-06-26 – 2021-06-27 (×4): 2.5 mg via ORAL
  Filled 2021-06-25 (×4): qty 1

## 2021-06-25 MED ORDER — TRAMADOL-ACETAMINOPHEN 37.5-325 MG PO TABS
1.0000 | ORAL_TABLET | Freq: Two times a day (BID) | ORAL | Status: DC
  Filled 2021-06-25: qty 1

## 2021-06-25 MED ORDER — PREDNISONE 5 MG PO TABS
5.0000 mg | ORAL_TABLET | Freq: Every day | ORAL | Status: DC
  Administered 2021-06-26 – 2021-06-27 (×2): 5 mg via ORAL
  Filled 2021-06-25 (×2): qty 1

## 2021-06-25 MED ORDER — KETOROLAC TROMETHAMINE 15 MG/ML IJ SOLN
15.0000 mg | Freq: Once | INTRAMUSCULAR | Status: AC
  Administered 2021-06-25: 15 mg via INTRAVENOUS
  Filled 2021-06-25: qty 1

## 2021-06-25 MED ORDER — ALPRAZOLAM 0.25 MG PO TABS
0.2500 mg | ORAL_TABLET | Freq: Two times a day (BID) | ORAL | Status: DC | PRN
  Administered 2021-06-26 (×2): 0.25 mg via ORAL
  Filled 2021-06-25 (×2): qty 1

## 2021-06-25 MED ORDER — MORPHINE SULFATE (PF) 2 MG/ML IV SOLN
1.0000 mg | Freq: Once | INTRAVENOUS | Status: DC
  Filled 2021-06-25: qty 1

## 2021-06-25 MED ORDER — FERROUS SULFATE 325 (65 FE) MG PO TABS
325.0000 mg | ORAL_TABLET | Freq: Every day | ORAL | Status: DC
  Administered 2021-06-26 – 2021-06-27 (×2): 325 mg via ORAL
  Filled 2021-06-25 (×2): qty 1

## 2021-06-25 MED ORDER — ALPRAZOLAM 0.25 MG PO TABS: 0.2500 mg | ORAL_TABLET | Freq: Two times a day (BID) | ORAL | Status: DC

## 2021-06-25 MED ORDER — PANTOPRAZOLE SODIUM 40 MG PO TBEC
40.0000 mg | DELAYED_RELEASE_TABLET | Freq: Every day | ORAL | Status: DC
  Administered 2021-06-26 – 2021-06-27 (×2): 40 mg via ORAL
  Filled 2021-06-25 (×2): qty 1

## 2021-06-25 MED ORDER — TRAMADOL HCL 50 MG PO TABS
50.0000 mg | ORAL_TABLET | Freq: Once | ORAL | Status: AC
  Administered 2021-06-25: 50 mg via ORAL
  Filled 2021-06-25: qty 1

## 2021-06-25 MED ORDER — DILTIAZEM HCL ER COATED BEADS 120 MG PO CP24
120.0000 mg | ORAL_CAPSULE | Freq: Every day | ORAL | Status: DC
  Administered 2021-06-26 – 2021-06-27 (×2): 120 mg via ORAL
  Filled 2021-06-25 (×2): qty 1

## 2021-06-25 MED ORDER — TRAMADOL-ACETAMINOPHEN 37.5-325 MG PO TABS
1.0000 | ORAL_TABLET | Freq: Four times a day (QID) | ORAL | Status: DC | PRN
  Administered 2021-06-26 (×3): 1 via ORAL
  Filled 2021-06-25 (×3): qty 1

## 2021-06-25 MED ORDER — HYDROXYCHLOROQUINE SULFATE 200 MG PO TABS
200.0000 mg | ORAL_TABLET | Freq: Every day | ORAL | Status: DC
  Administered 2021-06-26 – 2021-06-27 (×2): 200 mg via ORAL
  Filled 2021-06-25 (×3): qty 1

## 2021-06-25 NOTE — ED Notes (Signed)
Jodi Snyder, son, [phone] for updates

## 2021-06-25 NOTE — ED Provider Notes (Signed)
  Physical Exam  BP (!) 171/78 (BP Location: Left Arm)   Pulse 65   Temp 98.4 F (36.9 C) (Oral)   Resp 18   SpO2 98%   Physical Exam  ED Course/Procedures     Procedures  MDM  Accepted handoff at shift change from [facility], PA-C. Please see prior provider note for more detail.   Briefly: Patient is 87 y.o. with history of L1 compression fracture from previous fall.  DDX: concern for worsening L compression fracture vs. Stable known L compression fracture  Plan: CT pending dispo  Mild superior endplate fracture of L1 appears recent. No change from recent radiographs 06/16/2021. No other acute fracture. Lumbar scoliosis and degenerative changes.  Discussed CT findings with patient.  Educated her on what a spine compression fracture is in the risk outweighing the benefits for stronger pain medication other than at all.  Was given permission to speak with son who is concerned about her constipation and would like her on stronger pain medication.  I discussed the x-ray findings with the son.  The patient has a nonsurgical abdomen.  She denies any abdominal tenderness per my exam.  I discussed with the son that this may take longer than 2 days on MiraLAX to soften the stool and produce a bowel movement.  Discussed with the son that tramadol causes constipation and since she is taking this 3 times a day per PCP, this is likely the etiology of her constipation.  Additionally, on my evaluation, the patient was able to sit up and rotate out of bed with ease.  The patient was hesitant to stand as she was afraid to fall.  She was rounded by 2 nurses including myself and was assured we would not let her fall.  Patient was able to stand and take multiple steps and reported her pain was mainly in her hips from "laying in bed all day", but mention she was not having any back pain.  I discussed with her that I would like to see her walk with a walker, such as which she walks with at home, and would  report back.  When the walker was at bedside, discussed ambulation with patient.  Patient became very agitated and aggressive.  She denies having conversation with me.  She seems mildly altered, but A&O x3.  The same nurses originally with me initially at bedside where here again for ambulation of patient.  Due to this change in mentation, alerted attending who also assessed to see if he could the patient ambulate.  At this time, son came to bedside and reported this is not her typical mentation.  CBG, urinalysis, and CT head ordered.  All results came back within normal limits.  At home tramadol ordered.  Due to the change in mentation, refusal to walk, admission for further AMS work-up called.    Gust need for admission with son and patient.  Discussed with son that PT and OT can evaluate her during her stay for possible decision on upgrading from independent living to ALF versus SNF.  Dr. Toniann Fail will admit patient.           Achille Rich, PA-C 06/25/21 2358    Ernie Avena, MD 06/26/21 [phone]

## 2021-06-25 NOTE — ED Notes (Signed)
The pt is refusign to walk with the walker provided after agreeing with PA Alison Murray. Patient states hat " we are trying to kill me" and "you are wanting me to break my leg" pt is educated that we are wanting her to walk with a walker to ensure her safety at home and ensure that there is not another fall. PT states that "this is stupid". Pt continuously educated that we can not send her home safely without whitnessing her walk with the walker. Pt provided the choice to speak with another provider and the charge nurse and pt denied. Pt was told that this was the way for [place] to get her home and she states that she will just stay here

## 2021-06-25 NOTE — ED Provider Notes (Signed)
[facility] EM[facility]GENCY [facility]PART[facility] Provider Note   CSN: [id number] Arrival date & time: 06/25/21  1306     History Chief Complaint  Patient presents with   Back Pain   Constipation    Jodi GamblesMargaret Mollica is a 87 y.o. female with a past medical history significant for recent L1 compression fracture diagnosed approximately 2 weeks ago who presents with 2 complaints.  First patient reports that she had a small fall back onto her bed earlier this morning with significant back pain thereafter, could not go to her orthopedic follow-up appointment due to the significant pain in her back.  Second she has been taking tramadol 3 times daily for her back pain since the compression fracture, and has now been constipated for 9 days.  Patient has been taking Benefiber, MiraLAX just increased her MiraLAX to 2 capfuls twice daily last night and has had no bowel movements.  Patient does endorse 1 episode of some slight nausea, without vomiting, obstipation, or rectal bleeding. Patient endorses 5/10 back pain at rest 8/10 with movement.   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[facility]) 08/19/2018   Asymptomatic microscopic hematuria 01/05/2018   Paroxysmal atrial fibrillation ([facility]) 01/05/2018   Scoliosis 10/18/2017   Thoracogenic scoliosis of thoracolumbar region 10/08/2017   Glaucoma 06/21/2016   Drug therapy 01/28/2016   Benign hypertension with CKD (chronic kidney disease) stage III 01/16/2016   Discoid lupus erythematosus 01/16/2016   Generalized anxiety disorder 01/16/2016    Osteoporosis 01/16/2016   Polymyalgia rheumatica (HCC) 01/16/2016   Closed fracture of distal end of left radius 11/11/2014   Dry eye syndrome 08/03/2012   Other states following surgery of eye and adnexa 04/21/2012   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     Family History  Problem Relation Age of Onset   Heart attack Mother    Stroke Father    Anuerysm Father     Social History   Tobacco Use   Smoking status: Former   Smokeless tobacco: Never  Building services engineerVaping Use   Vaping Use: Never used  Substance Use Topics   Alcohol use: No   Drug use: No    Home Medications Prior to Admission medications   Medication Sig Start Date End Date Taking? Authorizing Provider  ALPRAZolam Prudy Feeler(XANAX) 0.5 MG tablet Take 0.25 mg by mouth 2 (two) times daily.  04/20/18   [provider]  brinzolamide (AZOPT) 1 % ophthalmic suspension Place 1 drop into both eyes 3 (three) times daily.     [provider]  cycloSPORINE (RESTASIS) 0.05 % ophthalmic emulsion Place 1 drop into both eyes 2 (two) times daily.     [provider]  diltiazem (CARDIZEM CD) 120 MG 24 hr capsule  TAKE 1 CAPSULE(120 MG) BY MOUTH DAILY 09/24/20   Camnitz, Andree Coss, MD  ELIQUIS 2.5 MG TABS tablet TAKE 1 TABLET BY MOUTH TWICE DAILY 03/27/20   Camnitz, Andree Coss, MD  esomeprazole (NEXIUM) 40 MG capsule Take 40 mg by mouth daily.     [provider]  ferrous sulfate 325 (65 FE) MG tablet Take 325 mg by mouth daily.    [provider]  hydroxychloroquine (PLAQUENIL) 200 MG tablet Take 200 mg by mouth daily.    [provider]  ketorolac (ACULAR) 0.4 % SOLN Apply 1 drop to eye 2 (two) times daily. 09/04/19    [provider]  lisinopril (ZESTRIL) 10 MG tablet Take 5 mg by mouth daily.    [provider]  Multiple Vitamins-Minerals (ICAPS AREDS 2 PO) Take 2 capsules by mouth 2 (two) times daily.    [provider]  predniSONE (DELTASONE) 5 MG tablet Take 5 mg by mouth daily.  01/13/18   [provider]  ROCKLATAN 0.02-0.005 % SOLN Apply 1 drop to eye at bedtime. 08/31/19   [provider]  senna-docusate (SENOKOT-S) 8.6-50 MG tablet Take 1 tablet by mouth 2 (two) times daily as needed for moderate constipation. 01/14/20   Almon Hercules, MD  traMADol-acetaminophen (ULTRACET) 37.5-325 MG tablet Take 1 tablet by mouth 2 (two) times daily.     [provider]    Allergies    Penicillins, Brimonidine, Other, Penicillin g, Timolol maleate, Sulfa antibiotics, and Sulfasalazine  Review of Systems   Review of Systems  Gastrointestinal:  Positive for constipation.  Musculoskeletal:  Positive for back pain.  All other systems reviewed and are negative.  Physical Exam Updated Vital Signs BP (!) 171/78 (BP Location: Left Arm)   Pulse 65   Temp 98.4 F (36.9 C) (Oral)   Resp 18   SpO2 98%   Physical Exam Vitals and nursing note reviewed.  Constitutional:      General: She is not in acute distress.    Appearance: Normal appearance.     Comments: Thin elderly woman laying on bed in no acute distress  HENT:     Head: Normocephalic and atraumatic.  Eyes:     General:        Right eye: No discharge.        Left eye: No discharge.  Cardiovascular:     Rate and Rhythm: Normal rate and regular rhythm.     Pulses: Normal pulses.     Heart sounds: No murmur heard.   No friction rub. No gallop.  Pulmonary:     Effort: Pulmonary effort is normal.     Breath sounds: Normal breath sounds.  Abdominal:     General: Bowel sounds are normal.     Palpations: Abdomen is soft.     Comments: Tenderness to palpation across entire abdomen, no significant  distention noted.  Musculoskeletal:     Comments: Tenderness to palpation midline lumbar spine, prominent vertebral processes, scoliosis noted. Patient has intact strength 5/5 bil LE. She does report some subjective numbness of feet but reports this is her baseline  Skin:    General: Skin is warm and dry.     Capillary Refill: Capillary refill takes less than 2 seconds.  Neurological:     Mental Status: She is alert and oriented to person, place, and time.  Psychiatric:        Mood and Affect: Mood normal.        Behavior: Behavior normal.  ED Results / Procedures / Treatments   Labs (all labs ordered are listed, but only abnormal results are displayed) Labs Reviewed  CBC WITH DIFFERENTIAL/PLATELET - Abnormal; Notable for the following components:      Result Value   MCV 100.5 (*)    All other components within normal limits  BASIC METABOLIC PANEL - Abnormal; Notable for the following components:   Creatinine, Ser 1.47 (*)    GFR, Estimated 34 (*)    All other components within normal limits    EKG None  Radiology DG Abdomen 1 View  Result Date: 06/25/2021 CLINICAL DATA:  Constipation.  Back pain EXAM: ABDOMEN - 1 VIEW COMPARISON:  06/24/2021 FINDINGS: Normal bowel gas pattern. No dilated bowel loops. Decreased bowel gas compared to the prior study. Atherosclerotic aorta.  Lumbar scoliosis.  No renal calculi. IMPRESSION: Normal bowel gas pattern. Electronically Signed   By: Franchot Gallo M.D.   On: 06/25/2021 14:41    Procedures Procedures   Medications Ordered in ED Medications  ketorolac (TORADOL) 15 MG/ML injection 15 mg (15 mg Intravenous Given 06/25/21 1447)    ED Course  I have reviewed the triage vital signs and the nursing notes.  Pertinent labs & imaging results that were available during my care of the patient were reviewed by me and considered in my medical decision making (see chart for details).    MDM Rules/Calculators/A&P                          Overall well-appearing elderly woman with known recent L1 compression fracture who presents with 9 days of constipation after being told to increase her tramadol to 3 times daily to help with her back pain.  Patient also with new acute worsening of her back pain secondary to a small fall onto her bed earlier this morning.  Patient reports the fall was mechanical, nonsyncopal.  CT of the lumbar spine pending at time of handoff.  Radiographic imaging of the abdomen shows normal bowel gas pattern, without significant stool burden or any evidence of bowel obstruction, consistent with patient's report of constipation.  Lab work is without significant abnormality.  We will control pain with Toradol. Patient has no red flags of back pain including history of cancer, chronic corticosteroid use, recent fever. She does have some known neuropathic numbness to bilateral feed. No saddle anesthesia. No issue with urination.  Encouraged fluids, continued increased miralax, fiber, decreased use of Tramadol.  3:32 PM Care of Deneen Harts transferred to [facility] and Dr. Armandina Gemma at the end of my shift as the patient will require reassessment once labs/imaging have resulted. Patient presentation, ED course, and plan of care discussed with review of all pertinent labs and imaging. Please see his/her note for further details regarding further ED course and disposition. Plan at time of handoff is pain control for spine, encouraged continued miralax, fluids, fiber. This may be altered or completely changed at the discretion of the oncoming team pending results of further workup.  Final Clinical Impression(s) / ED Diagnoses Final diagnoses:  None    Rx / DC Orders ED Discharge Orders     None        Dorien Chihuahua 06/25/21 1532    Teressa Lower, MD 06/25/21 1556

## 2021-06-25 NOTE — ED Notes (Signed)
ED Provider at bedside. 

## 2021-06-25 NOTE — ED Notes (Signed)
Patient transported to X-ray 

## 2021-06-25 NOTE — Social Work (Signed)
CSW received consult to call son Arlys John @ [phone] to discuss future needs for Pt. CSW discussed with son that SNF placement would not be indicated if there is no "rehabable" need as defined by diagnostic criteria. CSW explained the process of SNF placement if EDP sees a physical need as well as requiring an assessment by a physical therapist, and ultimately, insurance authorization to cover the expense of a SNF stay unless the family is willing to pay for SNF out of pocket.   Son reports that Pt has had previous SNF stay that family did ultimately have to pay for. CSW discussed the possibility that a higher level of care may need to be explored as Pt is currently is an independent living community, but, that ultimately that issue would need to be discussed by the family.  EDP will put in [facility] orders for [facility] who services [facility] [place].

## 2021-06-25 NOTE — ED Notes (Signed)
Pt educated that she should not sit on the side of the bed with her legs dangling due to the risk of losing balance and falling. Pt refused to put legs up on the bed and allow the side rale to be placed up.

## 2021-06-25 NOTE — ED Notes (Signed)
EDP at bedside  

## 2021-06-25 NOTE — Discharge Instructions (Addendum)
You were seen here today for back pain and constipation.  Your CT showed a stable compression fracture, not worsening from your original injury.  You can continue taking your tramadol as prescribed, although this may be the culprit of your constipation.  You can continue to up your MiraLAX to 2 capfuls a day.  Additionally, make sure that you are staying well-hydrated with fluids, mainly water.  If you have any concern, new or worsening symptoms, please return to the nearest emergency department for reevaluation.   ==================================================================================================== Information on my medicine - ELIQUIS (apixaban)  Why was Eliquis prescribed for you? Eliquis was prescribed for you to reduce the risk of a blood clot forming that can cause a stroke if you have a medical condition called atrial fibrillation (a type of irregular heartbeat).  What do You need to know about Eliquis ? Take your Eliquis TWICE DAILY - one tablet in the morning and one tablet in the evening with or without food. If you have difficulty swallowing the tablet whole please discuss with your pharmacist how to take the medication safely.  Take Eliquis exactly as prescribed by your doctor and DO NOT stop taking Eliquis without talking to the doctor who prescribed the medication.  Stopping may increase your risk of developing a stroke.  Refill your prescription before you run out.  After discharge, you should have regular check-up appointments with your healthcare provider that is prescribing your Eliquis.  In the future your dose may need to be changed if your kidney function or weight changes by a significant amount or as you get older.  What do you do if you miss a dose? If you miss a dose, take it as soon as you remember on the same day and resume taking twice daily.  Do not take more than one dose of ELIQUIS at the same time to make up a missed dose.  Important Safety  Information A possible side effect of Eliquis is bleeding. You should call your healthcare provider right away if you experience any of the following: Bleeding from an injury or your nose that does not stop. Unusual colored urine (red or dark brown) or unusual colored stools (red or black). Unusual bruising for unknown reasons. A serious fall or if you hit your head (even if there is no bleeding).  Some medicines may interact with Eliquis and might increase your risk of bleeding or clotting while on Eliquis. To help avoid this, consult your healthcare provider or pharmacist prior to using any new prescription or non-prescription medications, including herbals, vitamins, non-steroidal anti-inflammatory drugs (NSAIDs) and supplements.  This website has more information on Eliquis (apixaban): http://www.eliquis.com/eliquis/home

## 2021-06-25 NOTE — ED Notes (Signed)
Called patient's son Jazari Ober [phone] to see if he could take patient home. He requested to have ED provider call him to discuss discharge/admission. Rolene Arbour PA notified.

## 2021-06-25 NOTE — ED Triage Notes (Signed)
Pt bib ems from home with back pain onset approx 2 weeks ago with constipation X several days. Pt taking miralax without relief. VSS with ems.

## 2021-06-26 ENCOUNTER — Observation Stay (HOSPITAL_COMMUNITY): Payer: Medicare PPO

## 2021-06-26 ENCOUNTER — Encounter (HOSPITAL_COMMUNITY): Payer: Self-pay | Admitting: Internal Medicine

## 2021-06-26 ENCOUNTER — Other Ambulatory Visit: Payer: Self-pay

## 2021-06-26 DIAGNOSIS — M545 Low back pain, unspecified: Secondary | ICD-10-CM

## 2021-06-26 DIAGNOSIS — G9340 Encephalopathy, unspecified: Secondary | ICD-10-CM

## 2021-06-26 DIAGNOSIS — S32010A Wedge compression fracture of first lumbar vertebra, initial encounter for closed fracture: Secondary | ICD-10-CM

## 2021-06-26 LAB — CBC
HCT: 42 % (ref 36.0–46.0)
Hemoglobin: 13.4 g/dL (ref 12.0–15.0)
MCH: 31.8 pg (ref 26.0–34.0)
MCHC: 31.9 g/dL (ref 30.0–36.0)
MCV: 99.8 fL (ref 80.0–100.0)
Platelets: 250 10*3/uL (ref 150–400)
RBC: 4.21 MIL/uL (ref 3.87–5.11)
RDW: 13.3 % (ref 11.5–15.5)
WBC: 12 10*3/uL — ABNORMAL HIGH (ref 4.0–10.5)
nRBC: 0 % (ref 0.0–0.2)

## 2021-06-26 LAB — BASIC METABOLIC PANEL
Anion gap: 15 (ref 5–15)
BUN: 28 mg/dL — ABNORMAL HIGH (ref 8–23)
CO2: 22 mmol/L (ref 22–32)
Calcium: 8.9 mg/dL (ref 8.9–10.3)
Chloride: 102 mmol/L (ref 98–111)
Creatinine, Ser: 1.61 mg/dL — ABNORMAL HIGH (ref 0.44–1.00)
GFR, Estimated: 31 mL/min — ABNORMAL LOW (ref >60)
Glucose, Bld: 67 mg/dL — ABNORMAL LOW (ref 70–99)
Potassium: 3.2 mmol/L — ABNORMAL LOW (ref 3.5–5.1)
Sodium: 139 mmol/L (ref 135–145)

## 2021-06-26 LAB — TSH: TSH: 1.578 u[IU]/mL (ref 0.350–4.500)

## 2021-06-26 LAB — IRON AND TIBC
Iron: 54 ug/dL (ref 28–170)
Saturation Ratios: 19 % (ref 10.4–31.8)
TIBC: 284 ug/dL (ref 250–450)
UIBC: 230 ug/dL

## 2021-06-26 LAB — RETICULOCYTES
Immature Retic Fract: 16.3 % — ABNORMAL HIGH (ref 2.3–15.9)
RBC.: 4.18 MIL/uL (ref 3.87–5.11)
Retic Count, Absolute: 62.3 10*3/uL (ref 19.0–186.0)
Retic Ct Pct: 1.5 % (ref 0.4–3.1)

## 2021-06-26 LAB — RESP PANEL BY RT-PCR (FLU A&B, COVID) ARPGX2
Influenza A by PCR: NEGATIVE
Influenza B by PCR: NEGATIVE
SARS Coronavirus 2 by RT PCR: NEGATIVE

## 2021-06-26 LAB — FERRITIN: Ferritin: 92 ng/mL (ref 11–307)

## 2021-06-26 LAB — FOLATE: Folate: 53.4 ng/mL (ref >5.9)

## 2021-06-26 LAB — VITAMIN B12: Vitamin B-12: 1431 pg/mL — ABNORMAL HIGH (ref 180–914)

## 2021-06-26 MED ORDER — NETARSUDIL-LATANOPROST 0.02-0.005 % OP SOLN
1.0000 [drp] | Freq: Every day | OPHTHALMIC | Status: DC
Start: 2021-06-26 — End: 2021-06-27

## 2021-06-26 MED ORDER — ENSURE ENLIVE PO LIQD
237.0000 mL | Freq: Two times a day (BID) | ORAL | Status: DC
  Administered 2021-06-26 – 2021-06-27 (×2): 237 mL via ORAL
  Filled 2021-06-26: qty 237

## 2021-06-26 MED ORDER — AMIODARONE HCL 200 MG PO TABS
200.0000 mg | ORAL_TABLET | Freq: Every day | ORAL | Status: DC
  Administered 2021-06-26 – 2021-06-27 (×2): 200 mg via ORAL
  Filled 2021-06-26 (×2): qty 1

## 2021-06-26 MED ORDER — BRINZOLAMIDE 1 % OP SUSP
1.0000 [drp] | Freq: Three times a day (TID) | OPHTHALMIC | Status: DC
  Administered 2021-06-26: 1 [drp] via OPHTHALMIC
  Filled 2021-06-26: qty 10

## 2021-06-26 MED ORDER — CYCLOSPORINE 0.05 % OP EMUL
1.0000 [drp] | Freq: Two times a day (BID) | OPHTHALMIC | Status: DC
  Administered 2021-06-26 – 2021-06-27 (×2): 1 [drp] via OPHTHALMIC
  Filled 2021-06-26 (×4): qty 30

## 2021-06-26 MED ORDER — ACETAMINOPHEN 325 MG PO TABS
650.0000 mg | ORAL_TABLET | Freq: Four times a day (QID) | ORAL | Status: DC | PRN
  Administered 2021-06-27: 650 mg via ORAL
  Filled 2021-06-26: qty 2

## 2021-06-26 MED ORDER — POTASSIUM CHLORIDE CRYS ER 20 MEQ PO TBCR
40.0000 meq | EXTENDED_RELEASE_TABLET | ORAL | Status: AC
  Administered 2021-06-26 (×2): 40 meq via ORAL
  Filled 2021-06-26 (×2): qty 2

## 2021-06-26 MED ORDER — ACETAMINOPHEN 650 MG RE SUPP: 650.0000 mg | Freq: Four times a day (QID) | RECTAL | Status: DC | PRN

## 2021-06-26 MED ORDER — BISACODYL 10 MG RE SUPP
10.0000 mg | Freq: Once | RECTAL | Status: DC
  Filled 2021-06-26: qty 1

## 2021-06-26 NOTE — Plan of Care (Incomplete)
Pt admitted for back pain and constipation. Pt on tele. A&OX4, no skin issues. Pt oriented to floor and pm meds given.BP (!) 191/92 (BP Location: Right Arm)    Pulse 83    Temp 97.9 F (36.6 C) (Oral)    Resp 18    SpO2 99%  no c/o pain, call bell near by, bed in lowest position, bed alarm on. Reva Bores 06/26/21 2:49 AM

## 2021-06-26 NOTE — Progress Notes (Addendum)
Subjective: Patient admitted this morning, see detailed H&P by Dr Toniann Fail 87 year old female with history of atrial fibrillation, hypertension, discoid lupus, polymyalgia rheumatica had a fall 10 days ago and was taken to the urgent care.  X-ray showed lumbar fracture at L1 and was prescribed pain medication with Ortho referral as outpatient. Patient came back to ED as she was having worsening of low back pain, she also was found to be confused in the ED.  Also complained of constipation.  Confusion has resolved.  MRI brain was ordered which is unremarkable.  CT of lumbar spine showed mild L1 compression fracture.   Vitals:   06/26/21 0221 06/26/21 0344  BP: (!) 191/92 (!) 174/80  Pulse: 83 89  Resp: 18 18  Temp: 97.9 F (36.6 C) 98.2 F (36.8 C)  SpO2: 99% 100%      A/P  L1 compression fracture -CT lumbar spine showed mild L1 superior endplate fracture -Continue Ultracet as needed for pain -PT/OT -Consult neurosurgery for further recommendations   Constipation -Resolved after patient got Dulcolax suppository  Encephalopathy -Resolved; unclear etiology -MRI brain unremarkable  Atrial fibrillation -Currently in normal sinus rhythm, on amiodarone, Cardizem.  -Continue Eliquis for anticoagulation  History of discoid lupus/polymyalgia rheumatica -Continue hydroxychloroquine, prednisone  History of diastolic CHF -Euvolemic -Patient takes Lasix 3 times a week, currently on hold  Macrocytosis -Check anemia panel  Hypokalemia -Potassium is 3.2 -We will replace potassium with K-Dur 40 mEq p.o. x 2 -Follow BMP in am  Meredeth Ide [facility] Pager- [phone]

## 2021-06-26 NOTE — Progress Notes (Signed)
Orthopedic Tech Progress Note Patient Details:  Jodi Snyder 14-Jul-1932 [id number]  Ortho Devices Type of Ortho Device: Lumbar corsett Ortho Device/Splint Interventions: Ordered      Jodi Snyder 06/26/2021, 2:10 PM

## 2021-06-26 NOTE — H&P (Addendum)
History and Physical    Jodi Snyder DGL:[id number] DOB: 1932-06-05 DOA: 06/25/2021  PCP: Elspeth Cho., MD  Patient coming from: Independent living facility.  Chief Complaint: Low back pain.  HPI: Jodi Snyder is a 87 y.o. female with history of atrial fibrillation, hypertension, discoid lupus and polymyalgia rheumatica and a fall about 10 days ago and was taken to the urgent care where x-rays revealed lumbar fracture and was prescribed pain medication.  Over the last few days patient's pain has been progressively worsening and was brought to the ER.  Denies any incontinence of urine or bowel.  Patient has been having constipation for the last 10 days.  ED Course: In the ER patient briefly became confused after patient's son came to visit her.  Patient was confused for almost an hour and a half following which patient became back to baseline.  CT head is unremarkable.  CT of the lumbar spine shows L1 compression fracture.  On exam patient is able to move her extremities with mild weakness of right lower extremity which patient states has been chronic.  EKG shows normal sinus rhythm lab work show macrocytosis and creatinine 1.4.  COVID test is pending.  Patient admitted for worsening back pain with acute encephalopathy.  Review of Systems: As per HPI, rest all negative.   Past Medical History:  Diagnosis Date   Atrial fibrillation (HCC)    Hypertension    Immune system disorder (HCC)    Polymyalgia (HCC)     Past Surgical History:  Procedure Laterality Date   ABDOMINAL HYSTERECTOMY     BLADDER SURGERY     OOPHORECTOMY       reports that she has quit smoking. She has never used smokeless tobacco. She reports that she does not drink alcohol and does not use drugs.  Allergies  Allergen Reactions   Penicillins Hives and Swelling    Has patient had a PCN reaction causing immediate rash, facial/tongue/throat swelling, SOB or lightheadedness with hypotension: Yes Has patient  had a PCN reaction causing severe rash involving mucus membranes or skin necrosis: No Has patient had a PCN reaction that required hospitalization: No Has patient had a PCN reaction occurring within the last 10 years: No If all of the above answers are "NO", then may proceed with Cephalosporin use.    Brimonidine Other (See Comments)    Don't remember   Other     Nuts causes blisters No spices coffee   Penicillin G Other (See Comments)   Timolol Maleate Other (See Comments)    Pt becomes faint   Sulfa Antibiotics Rash   Sulfasalazine Rash    Family History  Problem Relation Age of Onset   Heart attack Mother    Stroke Father    Anuerysm Father     Prior to Admission medications   Medication Sig Start Date End Date Taking? Authorizing Provider  ALPRAZolam Prudy Feeler) 0.5 MG tablet Take 0.25-0.5 mg by mouth 2 (two) times daily. 04/20/18  Yes [provider]  amiodarone (PACERONE) 200 MG tablet Take 200 mg by mouth daily. 05/09/21  Yes [provider]  brinzolamide (AZOPT) 1 % ophthalmic suspension Place 1 drop into both eyes in the morning and at bedtime.   Yes [provider]  cycloSPORINE (RESTASIS) 0.05 % ophthalmic emulsion Place 1 drop into both eyes 2 (two) times daily.    Yes [provider]  diltiazem (CARDIZEM CD) 120 MG 24 hr capsule TAKE 1 CAPSULE(120 MG) BY MOUTH DAILY Patient  taking differently: Take 120 mg by mouth daily. 09/24/20  Yes Camnitz, Will Hassell Done, MD  ELIQUIS 2.5 MG TABS tablet TAKE 1 TABLET BY MOUTH TWICE DAILY Patient taking differently: Take 2.5 mg by mouth 2 (two) times daily. 03/27/20  Yes Camnitz, Will Hassell Done, MD  esomeprazole (NEXIUM) 40 MG capsule Take 40 mg by mouth daily as needed (heartburn).   Yes [provider]  ferrous sulfate 325 (65 FE) MG tablet Take 325 mg by mouth daily.   Yes [provider]  hydroxychloroquine (PLAQUENIL) 200 MG tablet Take 200 mg by mouth daily.   Yes [provider]   Multiple Vitamins-Minerals (ICAPS AREDS 2 PO) Take 2 capsules by mouth 2 (two) times daily.   Yes [provider]  predniSONE (DELTASONE) 5 MG tablet Take 5 mg by mouth daily.  01/13/18  Yes [provider]  senna-docusate (SENOKOT-S) 8.6-50 MG tablet Take 1 tablet by mouth 2 (two) times daily as needed for moderate constipation. 01/14/20  Yes Mercy Riding, MD  traMADol-acetaminophen (ULTRACET) 37.5-325 MG tablet Take 1 tablet by mouth 3 (three) times daily.   Yes [provider]  furosemide (LASIX) 20 MG tablet Take 20 mg by mouth every Monday, Wednesday, and Friday. 04/01/21   [provider]  lisinopril (ZESTRIL) 10 MG tablet Take 5 mg by mouth daily.    [provider]  potassium chloride (KLOR-CON) 10 MEQ tablet Take 10 mEq by mouth every Monday, Wednesday, and Friday. 04/01/21   [provider]  ROCKLATAN 0.02-0.005 % SOLN Place 1 drop into both eyes at bedtime. 08/31/19   [provider]    Physical Exam: Constitutional: Moderately built and nourished. Vitals:   06/25/21 1628 06/25/21 1753 06/25/21 2017 06/25/21 2213  BP: (!) 183/74 (!) 171/100  (!) 175/114  Pulse: 64 68  94  Resp: 18 18  20   Temp:  98.2 F (36.8 C)    TempSrc:  Oral    SpO2: 98% 97% 97% 98%   Eyes: Anicteric no pallor. ENMT: No discharge from the ears eyes nose and mouth. Neck: No mass felt.  No neck rigidity. Respiratory: No rhonchi or crepitations. Cardiovascular: S1-S2 heard. Abdomen: Soft nontender bowel sound present. Musculoskeletal: No edema. Skin: No rash. Neurologic: Alert awake oriented to time place and person moving all extremities. Psychiatric: Appears normal.  Normal affect.   Labs on Admission: I have personally reviewed following labs and imaging studies  CBC: Recent Labs  Lab 06/25/21 1410  WBC 6.0  NEUTROABS 4.6  HGB 12.6  HCT 39.9  MCV 100.5*  PLT [id number]   Basic Metabolic Panel: Recent Labs  Lab 06/25/21 1410  NA  142  K 3.6  CL 105  CO2 28  GLUCOSE 85  BUN 19  CREATININE 1.47*  CALCIUM 8.9   GFR: CrCl cannot be calculated (Unknown ideal weight.). Liver Function Tests: No results for input(s): AST, ALT, ALKPHOS, BILITOT, PROT, ALBUMIN in the last 168 hours. No results for input(s): LIPASE, AMYLASE in the last 168 hours. No results for input(s): AMMONIA in the last 168 hours. Coagulation Profile: No results for input(s): INR, PROTIME in the last 168 hours. Cardiac Enzymes: No results for input(s): CKTOTAL, CKMB, CKMBINDEX, TROPONINI in the last 168 hours. BNP (last 3 results) No results for input(s): PROBNP in the last 8760 hours. HbA1C: No results for input(s): HGBA1C in the last 72 hours. CBG: Recent Labs  Lab 06/25/21 2142  GLUCAP 95   Lipid Profile: No results for input(s): CHOL,  HDL, LDLCALC, TRIG, CHOLHDL, LDLDIRECT in the last 72 hours. Thyroid Function Tests: No results for input(s): TSH, T4TOTAL, FREET4, T3FREE, THYROIDAB in the last 72 hours. Anemia Panel: No results for input(s): VITAMINB12, FOLATE, FERRITIN, TIBC, IRON, RETICCTPCT in the last 72 hours. Urine analysis:    Component Value Date/Time   COLORURINE STRAW (A) 06/25/2021 2134   APPEARANCEUR CLEAR 06/25/2021 2134   LABSPEC 1.006 06/25/2021 2134   PHURINE 7.0 06/25/2021 2134   GLUCOSEU NEGATIVE 06/25/2021 2134   HGBUR NEGATIVE 06/25/2021 2134   BILIRUBINUR NEGATIVE 06/25/2021 2134   KETONESUR 5 (A) 06/25/2021 2134   PROTEINUR NEGATIVE 06/25/2021 2134   NITRITE NEGATIVE 06/25/2021 2134   LEUKOCYTESUR NEGATIVE 06/25/2021 2134   Sepsis Labs: @LABRCNTIP (procalcitonin:4,lacticidven:4) )No results found for this or any previous visit (from the past 240 hour(s)).   Radiological Exams on Admission: DG Abdomen 1 View  Result Date: 06/25/2021 CLINICAL DATA:  Constipation.  Back pain EXAM: ABDOMEN - 1 VIEW COMPARISON:  06/24/2021 FINDINGS: Normal bowel gas pattern. No dilated bowel loops. Decreased bowel gas  compared to the prior study. Atherosclerotic aorta.  Lumbar scoliosis.  No renal calculi. IMPRESSION: Normal bowel gas pattern. Electronically Signed   By: Franchot Gallo M.D.   On: 06/25/2021 14:41   CT Head Wo Contrast  Result Date: 06/25/2021 CLINICAL DATA:  Mental status change. EXAM: CT HEAD WITHOUT CONTRAST TECHNIQUE: Contiguous axial images were obtained from the base of the skull through the vertex without intravenous contrast. COMPARISON:  12/06/2020. FINDINGS: Brain: No acute intracranial hemorrhage, midline shift or mass effect. No extra-axial fluid collection is seen. There is diffuse atrophy. Extensive subcortical and periventricular white matter hypodensities are seen bilaterally. There is no hydrocephalus Vascular: Atherosclerotic calcification of the carotid siphons. No hyperdense vessel is identified. Skull: Normal. Negative for fracture or focal lesion. Sinuses/Orbits: No acute finding. Other: None. IMPRESSION: 1. No acute intracranial process. 2. Atrophy with extensive chronic microvascular ischemic changes. Electronically Signed   By: Brett Fairy M.D.   On: 06/25/2021 22:57   CT Lumbar Spine Wo Contrast  Result Date: 06/25/2021 CLINICAL DATA:  Back pain.  Lumbar compression fracture EXAM: CT LUMBAR SPINE WITHOUT CONTRAST TECHNIQUE: Multidetector CT imaging of the lumbar spine was performed without intravenous contrast administration. Multiplanar CT image reconstructions were also generated. COMPARISON:  Lumbar spine radiographs 06/16/2021 FINDINGS: Segmentation: 5 lumbar segments. Alignment: Mild to moderate lumbar scoliosis. Normal sagittal alignment. Vertebrae: Mild fracture superior endplate of L1 which appears acute or subacute. This was present on the prior radiographs. No other lumbar fracture. Paraspinal and other soft tissues: Atherosclerotic calcification aorta and iliacs without aneurysm. Bilateral renal cyst. No retroperitoneal adenopathy. Disc levels: L1-2: Diffuse disc  bulging without spinal stenosis L2-3: Disc degeneration with disc bulging.  No significant stenosis. L3-4: Diffuse bulging of the disc and bilateral facet degeneration. Mild spinal stenosis. Subarticular stenosis bilaterally L4-5: Diffuse disc bulging with moderate facet hypertrophy. Moderate spinal stenosis. Moderate subarticular stenosis bilaterally L5-S1: Advanced disc degeneration. Marked disc space narrowing with endplate spurring which is mild. Bilateral facet hypertrophy. No significant stenosis. IMPRESSION: Mild superior endplate fracture of L1 appears recent. No change from recent radiographs 06/16/2021. No other acute fracture Lumbar scoliosis and degenerative changes as above. Electronically Signed   By: Franchot Gallo M.D.   On: 06/25/2021 17:13    EKG: Independently reviewed.  Normal sinus rhythm.  Assessment/Plan Principal Problem:   Acute encephalopathy Active Problems:   Paroxysmal atrial fibrillation (HCC)   Polymyalgia rheumatica (HCC)   Closed compression fracture  of body of L1 vertebra ([place])    Acute encephalopathy/delirium -had a brief episode of confusion which lasted for around an hour and a half and has resolved.  Cause not clear MRI brain is pending. L1 compression fracture from recent fall still has pain.  May need orthopedic input.  Consult physical therapy. Atrial fibrillation presently in sinus rhythm on amiodarone and Eliquis and Cardizem. Hypertension on Cardizem. History of diastolic CHF per 2D echo done in 2018 showed grade 2 diastolic dysfunction with EF 65% presently appears compensated takes Lasix 3 times a week.,  Holding it for now. History of discoid lupus and polymyalgia rheumatica on hydroxychloroquine and prednisone. Macrocytosis will need anemia panel with next blood draw. Constipation -has not moved her bowels for almost 10 days now.  KUB is unremarkable.  We will try Dulcolax suppository if not working then may need enema.  COVID test is  pending.   DVT prophylaxis: Eliquis. Code Status: DNR. Family Communication: Patient's son. Disposition Plan: May need advanced level of care. Consults called: Physical therapy. Admission status: Observation.   Rise Patience MD [facility] Pager [phone].  If 7PM-7AM, please contact night-coverage [email or web address] Password [facility]  06/26/2021, 12:27 AM

## 2021-06-26 NOTE — Progress Notes (Signed)
Pt was upset and refusing medication. Finally got this pt to relax and take medication. Reason for delay of meds,

## 2021-06-26 NOTE — Progress Notes (Signed)
PT Cancellation Note  Patient Details Name: Jodi Snyder MRN: [id number] DOB: May 02, 1932   Cancelled Treatment:    Reason Eval/Treat Not Completed: Other (comment). Communicated with MD who reported orders for pt to get LSO (to be donned in sitting) for OOB mobility. Awaiting LSO to arrive for PT eval. Will follow-up as able.   Raymond Gurney, PT, DPT Acute Rehabilitation Services  Pager: [phone] Office: [phone]    Jewel Baize 06/26/2021, 1:24 PM

## 2021-06-26 NOTE — Evaluation (Signed)
Physical Therapy Evaluation Patient Details Name: Jodi Snyder MRN: [id number] DOB: 17-May-1932 Today's Date: 06/26/2021  History of Present Illness  Pt is an 87 y.o. female who presented 06/25/21 with worsened back pain and confusion s/p fall 10 days prior in which pt sustained a mild L1 compression fx. MRI of brain unremarkable. PMH: atrial fibrillation, hypertension, discoid lupus, polymyalgia rheumatica   Clinical Impression  Pt presents with condition above and deficits mentioned below, see PT Problem List. PTA, she was living at an ILF functioning mod I using her RW majority of the time, intermittently furniture surfing in her home. Pt does report an additional fall earlier in the year. Currently, pt displays deficits in memory, deficits/safety awareness, problem solving, command following, balance, and activity tolerance. Pt with little to no recall of spinal precautions reviewed multiple times during session. Pt also A&Ox2. Pt required up to minA for bedroom distance mobility with a RW today due to her ambulating with a very narrow BOS impacting her balance. Pt would benefit from further re-education of brace application and spinal precautions. Expect pt will improve quickly as she continues to mobilize. Recommending HHPT follow-up and frequent if not constant assistance/supervision initially until balance and cognition improve as she is currently at risk for subsequent falls. Will continue to follow acutely.       Recommendations for follow up therapy are one component of a multi-disciplinary discharge planning process, led by the attending physician.  Recommendations may be updated based on patient status, additional functional criteria and insurance authorization.  Follow Up Recommendations Home health PT    Assistance Recommended at Discharge Frequent or constant Supervision/Assistance (initially until cognition and balance improve)  Functional Status Assessment Patient has had a  recent decline in their functional status and demonstrates the ability to make significant improvements in function in a reasonable and predictable amount of time.  Equipment Recommendations  None recommended by PT    Recommendations for Other Services OT consult     Precautions / Restrictions Precautions Precautions: Fall;Back Precaution Booklet Issued: Yes (comment) Precaution Comments: Reviewed BLT but poor to no recall Required Braces or Orthoses: Spinal Brace Spinal Brace: Lumbar corset;Applied in sitting position (confirmed via secure chat with MD) Restrictions Weight Bearing Restrictions: No      Mobility  Bed Mobility Overal bed mobility: Needs Assistance Bed Mobility: Rolling;Sidelying to Sit Rolling: Min assist Sidelying to sit: Min assist       General bed mobility comments: Pt initially kept trying to perfom long-sitting in bed. Repeatedly cues pt to lay back and log roll, providing hand-over-hand directing to reach to rail to roll, minA. MinA to direct legs off EOB and ascend trunk.    Transfers Overall transfer level: Needs assistance Equipment used: Rolling walker (2 wheels) Transfers: Sit to/from Stand Sit to Stand: Min guard           General transfer comment: Extra time and use of UEs to push up to stand from EOB, no LOB.    Ambulation/Gait Ambulation/Gait assistance: Min guard;Min assist Gait Distance (Feet): 30 Feet Assistive device: Rolling walker (2 wheels) Gait Pattern/deviations: Step-through pattern;Decreased stride length;Narrow base of support Gait velocity: reduced Gait velocity interpretation: <1.31 ft/sec, indicative of household ambulator   General Gait Details: Pt with very narrow BOS, cues provided to widen with pt exaggerating correction. Pt slow with mild unsteadiness, needing min guard-minA to prevent LOB.  Stairs            Wheelchair Mobility    Modified Rankin (  Stroke Patients Only)       Balance Overall  balance assessment: Needs assistance Sitting-balance support: No upper extremity supported;Feet supported Sitting balance-Leahy Scale: Fair Sitting balance - Comments: Static sitting EOB with supervision.   Standing balance support: Reliant on assistive device for balance Standing balance-Leahy Scale: Poor Standing balance comment: Reliant on RW                             Pertinent Vitals/Pain Pain Assessment: Faces Faces Pain Scale: Hurts a little bit Body Language: relaxed Pain Location: back Pain Descriptors / Indicators: Discomfort;Grimacing;Guarding Pain Intervention(s): Limited activity within patient's tolerance;Monitored during session;Repositioned;Premedicated before session    Home Living Family/patient expects to be discharged to:: Assisted living (ILF)                 Home Equipment: Conservation officer, nature (2 wheels);Shower seat;Grab bars - tub/shower;Other (comment) (attachment for handrails on toilet) Additional Comments: Pt in ILF with no stairs to negotiate and with walk-in shower with handicap-height toilet. Standard bed. PRN assistance available from neighbor and step-son.    Prior Function Prior Level of Function : Independent/Modified Independent;History of Falls (last six months)             Mobility Comments: Pt was mod I using RW and intermittently furniture surfing in home. Hx of this fall and 1 additional one around March of this year in which pt was stepping back and tripped over an obstacle. ADLs Comments: Mod I for bathing and dressing.     Hand Dominance        Extremity/Trunk Assessment   Upper Extremity Assessment Upper Extremity Assessment: Defer to OT evaluation    Lower Extremity Assessment Lower Extremity Assessment: Overall WFL for tasks assessed (MMT scores of 4+ to 5 bil grossly; no acute changes in sensation, reports hx of numbness/tingling in feet)    Cervical / Trunk Assessment Cervical / Trunk Assessment: Other  exceptions;Kyphotic Cervical / Trunk Exceptions: L1 compression fx  Communication   Communication: No difficulties  Cognition Arousal/Alertness: Awake/alert Behavior During Therapy: WFL for tasks assessed/performed Overall Cognitive Status: Impaired/Different from baseline Area of Impairment: Orientation;Attention;Memory;Following commands;Safety/judgement;Awareness;Problem solving                 Orientation Level: Disoriented to;Place;Situation Current Attention Level: Selective Memory: Decreased short-term memory;Decreased recall of precautions Following Commands: Follows one step commands with increased time;Follows multi-step commands inconsistently Safety/Judgement: Decreased awareness of safety;Decreased awareness of deficits Awareness: Emergent Problem Solving: Slow processing;Difficulty sequencing;Requires verbal cues;Requires tactile cues General Comments: Pt unable to identify current location, stating "there was a name for this in high school", even when provided a list to choose from. Pt remembers hurting her back but forgets exactly what injury she sustained. Little to no recall of precautions, needing cues to maintain throughout. Repetition needed to cue pt with sequencing tasks to maintain spinal precautions.        General Comments General comments (skin integrity, edema, etc.): Educated pt and step-son on need for frequent if unable to provide constant supervision of pt initially upon d/c due to cognitive and balance deficits. Educated on donning brace.    Exercises     Assessment/Plan    PT Assessment Patient needs continued PT services  PT Problem List Decreased activity tolerance;Decreased balance;Decreased mobility;Decreased cognition;Decreased safety awareness;Decreased knowledge of precautions;Impaired sensation;Pain       PT Treatment Interventions DME instruction;Gait training;Functional mobility training;Therapeutic activities;Therapeutic  exercise;Balance training;Neuromuscular re-education;Cognitive remediation;Patient/family education  PT Goals (Current goals can be found in the Care Plan section)  Acute Rehab PT Goals Patient Stated Goal: to improve her cognition PT Goal Formulation: With patient/family Time For Goal Achievement: 07/10/21 Potential to Achieve Goals: Good    Frequency Min 4X/week   Barriers to discharge        Co-evaluation               AM-PAC PT "6 Clicks" Mobility  Outcome Measure Help needed turning from your back to your side while in a flat bed without using bedrails?: A Little Help needed moving from lying on your back to sitting on the side of a flat bed without using bedrails?: A Little Help needed moving to and from a bed to a chair (including a wheelchair)?: A Little Help needed standing up from a chair using your arms (e.g., wheelchair or bedside chair)?: A Little Help needed to walk in hospital room?: A Little Help needed climbing 3-5 steps with a railing? : A Lot 6 Click Score: 17    End of Session Equipment Utilized During Treatment: Back brace Activity Tolerance: Patient tolerated treatment well Patient left: in chair;with call bell/phone within reach;with chair alarm set;with family/visitor present   PT Visit Diagnosis: Unsteadiness on feet (R26.81);Other abnormalities of gait and mobility (R26.89);Repeated falls (R29.6);History of falling (Z91.81);Difficulty in walking, not elsewhere classified (R26.2);Pain Pain - Right/Left:  (back) Pain - part of body:  (back)    Time: 1610-9604 PT Time Calculation (min) (ACUTE ONLY): 36 min   Charges:   PT Evaluation $PT Eval Moderate Complexity: 1 Mod PT Treatments $Therapeutic Activity: 8-22 mins        Raymond Gurney, PT, DPT Acute Rehabilitation Services  Pager: [phone] Office: [phone]   Jodi Snyder 06/26/2021, 4:16 PM

## 2021-06-27 DIAGNOSIS — I480 Paroxysmal atrial fibrillation: Secondary | ICD-10-CM | POA: Diagnosis not present

## 2021-06-27 DIAGNOSIS — S32010A Wedge compression fracture of first lumbar vertebra, initial encounter for closed fracture: Secondary | ICD-10-CM | POA: Diagnosis not present

## 2021-06-27 DIAGNOSIS — G9340 Encephalopathy, unspecified: Secondary | ICD-10-CM | POA: Diagnosis not present

## 2021-06-27 LAB — BASIC METABOLIC PANEL
Anion gap: 11 (ref 5–15)
BUN: 31 mg/dL — ABNORMAL HIGH (ref 8–23)
CO2: 23 mmol/L (ref 22–32)
Calcium: 9.1 mg/dL (ref 8.9–10.3)
Chloride: 105 mmol/L (ref 98–111)
Creatinine, Ser: 1.56 mg/dL — ABNORMAL HIGH (ref 0.44–1.00)
GFR, Estimated: 32 mL/min — ABNORMAL LOW (ref >60)
Glucose, Bld: 79 mg/dL (ref 70–99)
Potassium: 4.2 mmol/L (ref 3.5–5.1)
Sodium: 139 mmol/L (ref 135–145)

## 2021-06-27 LAB — CBC
HCT: 40.3 % (ref 36.0–46.0)
Hemoglobin: 13.2 g/dL (ref 12.0–15.0)
MCH: 32.1 pg (ref 26.0–34.0)
MCHC: 32.8 g/dL (ref 30.0–36.0)
MCV: 98.1 fL (ref 80.0–100.0)
Platelets: 241 10*3/uL (ref 150–400)
RBC: 4.11 MIL/uL (ref 3.87–5.11)
RDW: 13.2 % (ref 11.5–15.5)
WBC: 10.8 10*3/uL — ABNORMAL HIGH (ref 4.0–10.5)
nRBC: 0 % (ref 0.0–0.2)

## 2021-06-27 MED ORDER — TRAMADOL-ACETAMINOPHEN 37.5-325 MG PO TABS: 1.0000 | ORAL_TABLET | Freq: Three times a day (TID) | ORAL | 0 refills | Status: DC | PRN

## 2021-06-27 NOTE — TOC Initial Note (Signed)
Transition of Care [facility]) - Initial/Assessment Note    Patient Details  Name: Jodi Snyder MRN: [id number] Date of Birth: 04-09-1932  [facility] CM/SW Contact:    Bess Kinds, RN Phone Number: [phone] 06/27/2021, 10:58 AM  Clinical Narrative:                  Spoke with Mariana Arn on the phone to discuss plans for transition home. Patient is from independent living at the [place].   Discussed home health PT recommendations. Vickey Sages is a Airline pilot - CM to reach out to [employer] to advise of [facility] PT recommendations. Patient will need HH PT/Face to Face order prior to discharge.   Arlys John stated that patient may be discharged today. He is arranging transportation with a friend, but if unable to make arrangements, he will pick her up about 8pm after he gets off work.   TOC following for transition needs.   Expected Discharge Plan: Home w Home Health Services Barriers to Discharge: Continued Medical Work up   Patient Goals and CMS Choice Patient states their goals for this hospitalization and ongoing recovery are:: return to independent living CMS Medicare.gov Compare Post Acute Care list provided to:: Patient Represenative (must comment) Mariana Arn) Choice offered to / list presented to : Adult Children  Expected Discharge Plan and Services Expected Discharge Plan: Home w Home Health Services In-house Referral: NA Discharge Planning Services: CM Consult Post Acute Care Choice: Home Health Living arrangements for the past 2 months: Independent Living Facility                 DME Arranged: N/A DME Agency: NA       HH Arranged: PT HH Agency: Other - See comment International aid/development worker)        Prior Living Arrangements/Services Living arrangements for the past 2 months: Independent Living Facility Lives with:: Self   Do you feel safe going back to the place where you live?: Yes          Current home services: DME (walker)    Activities of Daily  Living Home Assistive Devices/Equipment: Environmental consultant (specify type) ADL Screening (condition at time of admission) Patient's cognitive ability adequate to safely complete daily activities?: Yes Is the patient deaf or have difficulty hearing?: No Does the patient have difficulty seeing, even when wearing glasses/contacts?: Yes Does the patient have difficulty concentrating, remembering, or making decisions?: No Patient able to express need for assistance with ADLs?: Yes Does the patient have difficulty dressing or bathing?: Yes Independently performs ADLs?: Yes (appropriate for developmental age) Does the patient have difficulty walking or climbing stairs?: No Weakness of Legs: None Weakness of Arms/Hands: None  Permission Sought/Granted                  Emotional Assessment              Admission diagnosis:  Drug-induced constipation [K59.03] Acute encephalopathy [G93.40] Acute midline low back pain without sciatica [M54.50] Patient Active Problem List   Diagnosis Date Noted   Closed compression fracture of body of L1 vertebra (HCC) 06/26/2021   Acute midline low back pain without sciatica    Acute encephalopathy 06/25/2021   Acute weakness 01/11/2020   Chest pain 01/09/2020   Acute left-sided thoracic back pain    Supraventricular tachycardia (HCC) 09/29/2019   Hypokalemia    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HCC) 08/19/2018   Asymptomatic microscopic hematuria 01/05/2018   Paroxysmal atrial fibrillation (HCC) 01/05/2018   Scoliosis  10/18/2017   Thoracogenic scoliosis of thoracolumbar region 10/08/2017   Glaucoma 06/21/2016   Drug therapy 01/28/2016   Benign hypertension with CKD (chronic kidney disease) stage III 01/16/2016   Discoid lupus erythematosus 01/16/2016   Generalized anxiety disorder 01/16/2016   Osteoporosis 01/16/2016   Polymyalgia rheumatica ([place]) 01/16/2016   Closed fracture of distal end of left radius 11/11/2014   Dry eye syndrome 08/03/2012   Other  states following surgery of eye and adnexa 04/21/2012   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

## 2021-06-27 NOTE — Evaluation (Signed)
Occupational Therapy Evaluation Patient Details Name: Jodi Snyder MRN: [id number] DOB: 11-05-1931 Today's Date: 06/27/2021   History of Present Illness Pt is an 87 y.o. female who presented 06/25/21 with worsened back pain and confusion s/p fall 10 days prior in which pt sustained a mild L1 compression fx. MRI of brain unremarkable. PMH: atrial fibrillation, hypertension, discoid lupus, polymyalgia rheumatica   Clinical Impression   Prior to admission, pt reports living in an independent living facility, receiving PRN S/A from family and staff. Pt reports using a RW for functional mobility/transfers/basic self-care and depending on staff/family for all IADLs. Pt demonstrates cognitive deficits therefore need to confirm PLOF/home setup information with family.   Today, pt received semi-reclined in bed, pt agreeable to OT. Pt lethargic upon arrival but became more alert throughout tx. Pt Ox1, requiring re-orientation to place, reason, date. Pt unable to recall spinal precautions upon arrival but able to recall 2/3 spinal precautions at end of session. OT donned pt's lumbar brace while sitting EOB. Pt required min assist for bed mobility, min assist for sit>stand from EOB, min assist for functional mobility/transfers in room (EOB>sink>chair) using RW, min-mod assist for LB self-care, and mod assist-mod I for UB self-care. Educated pt on spinal precautions, lumbar brace management/purpose, PLB, activity pacing, log rolling, cognitive re-orientation, and process of acute care OT/beyond. Need to confirm amount of S/A available from family upon d/c. OT will continue following pt acutely.       Recommendations for follow up therapy are one component of a multi-disciplinary discharge planning process, led by the attending physician.  Recommendations may be updated based on patient status, additional functional criteria and insurance authorization.   Follow Up Recommendations  Other (comment) (Highly  recommend initial 24/7 S/A for safety and HHOT, may need to consider ALF or SNF given pt's deconditioning/cognitive deficits/fall hx)    Assistance Recommended at Discharge Frequent or constant Supervision/Assistance  Functional Status Assessment  Patient has had a recent decline in their functional status and demonstrates the ability to make significant improvements in function in a reasonable and predictable amount of time.  Equipment Recommendations  None recommended by OT    Recommendations for Other Services  (None)     Precautions / Restrictions Precautions Precautions: Fall;Back Precaution Booklet Issued: Yes (comment) Precaution Comments: Reviewed BLT, fair recall Required Braces or Orthoses: Spinal Brace Spinal Brace: Lumbar corset;Applied in sitting position Restrictions Weight Bearing Restrictions: No      Mobility Bed Mobility Overal bed mobility: Needs Assistance Bed Mobility: Sidelying to Sit Rolling: Min assist Sidelying to sit: Min assist       General bed mobility comments: min assist for log rolling and sitting L EOB using bed railing    Transfers Overall transfer level: Needs assistance Equipment used: Rolling walker (2 wheels) Transfers: Sit to/from Stand Sit to Stand: Min assist           General transfer comment: Extra time and use of UEs to push up to stand from EOB, no LOB.      Balance Overall balance assessment: Needs assistance Sitting-balance support: Feet supported;Bilateral upper extremity supported Sitting balance-Leahy Scale: Good     Standing balance support: Bilateral upper extremity supported;During functional activity;Reliant on assistive device for balance Standing balance-Leahy Scale: Poor Standing balance comment: Reliant on RW       ADL either performed or assessed with clinical judgement   ADL Overall ADL's : Needs assistance/impaired Eating/Feeding: Modified independent;Supervision/ safety;Sitting Eating/Feeding  Details (indicate cue type and reason): eating  lunch sitting in chair, assistance with cutting meat up Grooming: Wash/dry hands;Minimal assistance;Standing Grooming Details (indicate cue type and reason): standing at sink using RW Upper Body Bathing: Minimal assistance;Sitting Upper Body Bathing Details (indicate cue type and reason): simulated in chair Lower Body Bathing: Minimal assistance;Moderate assistance;Sitting/lateral leans;Sit to/from stand Lower Body Bathing Details (indicate cue type and reason): simulated in sitting/standing Upper Body Dressing : Minimal assistance;Sitting Upper Body Dressing Details (indicate cue type and reason): simulated in sitting, assistance needed with applying lumbar brace Lower Body Dressing: Moderate assistance;Sitting/lateral leans;Sit to/from stand;Minimal assistance Lower Body Dressing Details (indicate cue type and reason): min assist for socks sitting EOB using figure four but would need mod assist for underwear/pants Toilet Transfer: Minimal assistance;Cueing for safety;Cueing for sequencing;Ambulation;Rolling walker (2 wheels) Toilet Transfer Details (indicate cue type and reason): min assist for sit>stand from EOB, functional mobility from EOB>sink>chair using RW Toileting- Clothing Manipulation and Hygiene: Moderate assistance;Sitting/lateral lean;Sit to/from stand Toileting - Clothing Manipulation Details (indicate cue type and reason): simulated in sitting/standing Tub/ Shower Transfer:  (not assessed)   Functional mobility during ADLs: Minimal assistance;Rolling walker (2 wheels) General ADL Comments: min assist for functional mobility/transfers using RW, cues for safety     Vision Baseline Vision/History: 1 Wears glasses Ability to See in Adequate Light: 0 Adequate Patient Visual Report: No change from baseline Vision Assessment?: No apparent visual deficits Additional Comments: wore glasses            Pertinent Vitals/Pain Pain  Assessment: 0-10 Pain Score: 10-Worst pain ever Faces Pain Scale: Hurts little more Body Language: relaxed Pain Location: back Pain Descriptors / Indicators: Discomfort;Grimacing;Guarding Pain Intervention(s): Monitored during session;Repositioned     Hand Dominance Right   Extremity/Trunk Assessment Upper Extremity Assessment Upper Extremity Assessment: Overall WFL for tasks assessed;Generalized weakness   Lower Extremity Assessment Lower Extremity Assessment: Defer to PT evaluation   Cervical / Trunk Assessment Cervical / Trunk Assessment: Other exceptions;Kyphotic Cervical / Trunk Exceptions: L1 compression fx   Communication Communication Communication: No difficulties   Cognition Arousal/Alertness: Awake/alert Behavior During Therapy: WFL for tasks assessed/performed Overall Cognitive Status: Impaired/Different from baseline Area of Impairment: Orientation;Attention;Memory;Following commands;Safety/judgement;Awareness;Problem solving       Orientation Level: Disoriented to;Place;Time;Situation Current Attention Level: Selective Memory: Decreased short-term memory;Decreased recall of precautions Following Commands: Follows one step commands with increased time;Follows multi-step commands inconsistently Safety/Judgement: Decreased awareness of safety;Decreased awareness of deficits Awareness: Emergent Problem Solving: Slow processing;Difficulty sequencing;Requires verbal cues;Requires tactile cues General Comments: pt more oriented today with less confusion but still below baseline, pt recalled 2/3 spinal precautions at end of tx session, pt does not fully understand extent of recovery     General Comments  skin intact, donned lumbar brace sitting EOB            Home Living Family/patient expects to be discharged to:: Other (Comment) (ILF)       Home Equipment: Rolling Walker (2 wheels);Cane - single point;Shower seat;Wheelchair - manual   Additional  Comments: Pt in ILF with no stairs to negotiate and with walk-in shower with handicap-height toilet. Standard bed. PRN assistance available from neighbor and step-son.      Prior Functioning/Environment Prior Level of Function : Independent/Modified Independent;History of Falls (last six months)             Mobility Comments: Pt was mod I using RW and intermittently furniture surfing in home. Hx of this fall and 1 additional one around March of this year in which pt was stepping back and  tripped over an obstacle. ADLs Comments: mod I for self-care using walker support, dependent on family and staff for IADLs        OT Problem List: Decreased strength;Decreased activity tolerance;Impaired balance (sitting and/or standing);Decreased cognition;Decreased safety awareness;Decreased knowledge of precautions;Pain      OT Treatment/Interventions: Self-care/ADL training;Therapeutic exercise;Neuromuscular education;DME and/or AE instruction;Therapeutic activities;Cognitive remediation/compensation;Patient/family education    OT Goals(Current goals can be found in the care plan section) Acute Rehab OT Goals Patient Stated Goal: return home and less back pain OT Goal Formulation: With patient Time For Goal Achievement: 07/11/21 Potential to Achieve Goals: Good  OT Frequency: Min 2X/week    AM-PAC OT "6 Clicks" Daily Activity     Outcome Measure Help from another person eating meals?: A Little Help from another person taking care of personal grooming?: A Little Help from another person toileting, which includes using toliet, bedpan, or urinal?: A Lot Help from another person bathing (including washing, rinsing, drying)?: A Lot Help from another person to put on and taking off regular upper body clothing?: A Little Help from another person to put on and taking off regular lower body clothing?: A Lot 6 Click Score: 15   End of Session Equipment Utilized During Treatment: Gait belt;Rolling  walker (2 wheels) Nurse Communication: Mobility status;Weight bearing status;Other (comment);Precautions (sitting in chair)  Activity Tolerance: Patient tolerated treatment well;Patient limited by pain Patient left: in chair;with call bell/phone within reach;with chair alarm set  OT Visit Diagnosis: Unsteadiness on feet (R26.81);Repeated falls (R29.6);Muscle weakness (generalized) (M62.81);History of falling (Z91.81);Other symptoms and signs involving cognitive function;Pain Pain - Right/Left:  (back) Pain - part of body:  (back)                Time: 9292-4462 OT Time Calculation (min): 38 min Charges:  OT General Charges $OT Visit: 1 Visit OT Evaluation $OT Eval Moderate Complexity: 1 Mod OT Treatments $Self Care/Home Management : 8-22 mins  Norris Cross, OTR/L [facility] [phone]  Mechele Claude 06/27/2021, 2:29 PM

## 2021-06-27 NOTE — Care Management Obs Status (Signed)
MEDICARE OBSERVATION STATUS NOTIFICATION   Patient Details  Name: Jodi Snyder MRN: [id number] Date of Birth: Apr 13, 1932   Medicare Observation Status Notification Given:  Yes    Bess Kinds, RN 06/27/2021, 10:52 AM

## 2021-06-27 NOTE — Discharge Summary (Signed)
Physician Discharge Summary  Jodi Snyder MGQ:[id number] DOB: [birth year] DOA: 06/25/2021  PCP: Elspeth Cho., MD  Admit date: 06/25/2021 Discharge date: 06/27/2021  Time spent: 60 minutes  Recommendations for Outpatient Follow-up:  Patient to be discharged home with home health PT Follow-up neurosurgery in 2 weeks   Discharge Diagnoses:  Principal Problem:   Acute encephalopathy Active Problems:   Paroxysmal atrial fibrillation (HCC)   Polymyalgia rheumatica (HCC)   Closed compression fracture of body of L1 vertebra (HCC)   Discharge Condition: Stable  Diet recommendation: Heart healthy diet  There were no vitals filed for this visit.  History of present illness:  87 year old female with history of atrial fibrillation, hypertension, discoid lupus, polymyalgia rheumatica had a fall 10 days ago and was taken to the urgent care.  X-ray showed lumbar fracture at L1 and was prescribed pain medication with Ortho referral as outpatient. Patient came back to ED as she was having worsening of low back pain, she also was found to be confused in the ED.  Also complained of constipation.  Confusion has resolved.  MRI brain was ordered which is unremarkable.  CT of lumbar spine showed mild L1 compression fracture  Hospital Course:   L1 compression fracture -CT lumbar spine showed mild L1 superior endplate fracture -Continue Ultracet as needed for pain -PT/OT was consulted, recommend home health PT -Called neurosurgery, discussed with Dr. Franky Macho -He recommended LSO brace and follow-up with neurosurgery in 2 weeks -LSO brace was ordered, patient to follow-up neurosurgery in 2 weeks as outpatient.     Constipation -Resolved after patient got Dulcolax suppository   Encephalopathy -Resolved; unclear etiology -MRI brain unremarkable -Likely medication induced   Atrial fibrillation -Currently in normal sinus rhythm, on amiodarone, Cardizem.  -Continue Eliquis for  anticoagulation   History of discoid lupus/polymyalgia rheumatica -Continue hydroxychloroquine, prednisone   History of diastolic CHF -Euvolemic -Patient takes Lasix 3 times a week   Macrocytosis -Anemia panel unremarkable -Unclear etiology -Follow-up PCP as outpatient   Hypokalemia -Replete  Procedures:   Consultations:   Discharge Exam: Vitals:   06/26/21 2033 06/27/21 0811  BP: (!) 168/80 (!) 171/88  Pulse: 74 65  Resp: 18 16  Temp: 98 F (36.7 C) 98.2 F (36.8 C)  SpO2: 100% 98%    General: Appears in no acute distress Cardiovascular: S1-S2, regular, no murmur auscultated Respiratory: Clear to auscultation bilaterally  Discharge Instructions   Discharge Instructions     Diet - low sodium heart healthy   Complete by: As directed    Increase activity slowly   Complete by: As directed       Allergies as of 06/27/2021       Reactions   Penicillins Hives, Swelling   Has patient had a PCN reaction causing immediate rash, facial/tongue/throat swelling, SOB or lightheadedness with hypotension: Yes Has patient had a PCN reaction causing severe rash involving mucus membranes or skin necrosis: No Has patient had a PCN reaction that required hospitalization: No Has patient had a PCN reaction occurring within the last 10 years: No If all of the above answers are "NO", then may proceed with Cephalosporin use.   Brimonidine Other (See Comments)   Don't remember   Other    Nuts causes blisters No spices coffee   Penicillin G Other (See Comments)   Timolol Maleate Other (See Comments)   Pt becomes faint   Sulfa Antibiotics Rash   Sulfasalazine Rash        Medication List  TAKE these medications    ALPRAZolam 0.5 MG tablet Commonly known as: XANAX Take 0.25-0.5 mg by mouth 2 (two) times daily.   amiodarone 200 MG tablet Commonly known as: PACERONE Take 200 mg by mouth daily.   brinzolamide 1 % ophthalmic suspension Commonly known as:  AZOPT Place 1 drop into both eyes in the morning and at bedtime.   cycloSPORINE 0.05 % ophthalmic emulsion Commonly known as: RESTASIS Place 1 drop into both eyes 2 (two) times daily.   diltiazem 120 MG 24 hr capsule Commonly known as: CARDIZEM CD TAKE 1 CAPSULE(120 MG) BY MOUTH DAILY What changed: See the new instructions.   Eliquis 2.5 MG Tabs tablet Generic drug: apixaban TAKE 1 TABLET BY MOUTH TWICE DAILY What changed: how much to take   esomeprazole 40 MG capsule Commonly known as: NEXIUM Take 40 mg by mouth daily as needed (heartburn).   ferrous sulfate 325 (65 FE) MG tablet Take 325 mg by mouth daily.   furosemide 20 MG tablet Commonly known as: LASIX Take 20 mg by mouth every Monday, Wednesday, and Friday.   hydroxychloroquine 200 MG tablet Commonly known as: PLAQUENIL Take 200 mg by mouth daily.   ICAPS AREDS 2 PO Take 2 capsules by mouth 2 (two) times daily.   lisinopril 10 MG tablet Commonly known as: ZESTRIL Take 5 mg by mouth daily.   meclizine 12.5 MG tablet Commonly known as: ANTIVERT Take 12.5 mg by mouth 3 (three) times daily as needed for dizziness.   polyethylene glycol powder 17 GM/SCOOP powder Commonly known as: GLYCOLAX/MIRALAX Take 34 g by mouth 2 (two) times daily as needed for mild constipation.   potassium chloride 10 MEQ tablet Commonly known as: KLOR-CON Take 10 mEq by mouth every Monday, Wednesday, and Friday.   predniSONE 5 MG tablet Commonly known as: DELTASONE Take 5 mg by mouth daily.   Rocklatan 0.02-0.005 % Soln Generic drug: Netarsudil-Latanoprost Place 1 drop into both eyes at bedtime.   senna-docusate 8.6-50 MG tablet Commonly known as: Senokot-S Take 1 tablet by mouth 2 (two) times daily as needed for moderate constipation.   traMADol-acetaminophen 37.5-325 MG tablet Commonly known as: ULTRACET Take 1 tablet by mouth every 8 (eight) hours as needed for severe pain. What changed:  when to take this reasons to  take this       Allergies  Allergen Reactions   Penicillins Hives and Swelling    Has patient had a PCN reaction causing immediate rash, facial/tongue/throat swelling, SOB or lightheadedness with hypotension: Yes Has patient had a PCN reaction causing severe rash involving mucus membranes or skin necrosis: No Has patient had a PCN reaction that required hospitalization: No Has patient had a PCN reaction occurring within the last 10 years: No If all of the above answers are "NO", then may proceed with Cephalosporin use.    Brimonidine Other (See Comments)    Don't remember   Other     Nuts causes blisters No spices coffee   Penicillin G Other (See Comments)   Timolol Maleate Other (See Comments)    Pt becomes faint   Sulfa Antibiotics Rash   Sulfasalazine Rash    Follow-up Information     Elspeth Cho., MD.   Specialty: Internal Medicine Contact information: [address] [place] [phone]                  The results of significant diagnostics from this hospitalization (including imaging, microbiology, ancillary and  laboratory) are listed below for reference.    Significant Diagnostic Studies: DG Abdomen 1 View  Result Date: 06/25/2021 CLINICAL DATA:  Constipation.  Back pain EXAM: ABDOMEN - 1 VIEW COMPARISON:  06/24/2021 FINDINGS: Normal bowel gas pattern. No dilated bowel loops. Decreased bowel gas compared to the prior study. Atherosclerotic aorta.  Lumbar scoliosis.  No renal calculi. IMPRESSION: Normal bowel gas pattern. Electronically Signed   By: Marlan Palau M.D.   On: 06/25/2021 14:41   CT Head Wo Contrast  Result Date: 06/25/2021 CLINICAL DATA:  Mental status change. EXAM: CT HEAD WITHOUT CONTRAST TECHNIQUE: Contiguous axial images were obtained from the base of the skull through the vertex without intravenous contrast. COMPARISON:  12/06/2020. FINDINGS: Brain: No acute intracranial hemorrhage, midline shift  or mass effect. No extra-axial fluid collection is seen. There is diffuse atrophy. Extensive subcortical and periventricular white matter hypodensities are seen bilaterally. There is no hydrocephalus Vascular: Atherosclerotic calcification of the carotid siphons. No hyperdense vessel is identified. Skull: Normal. Negative for fracture or focal lesion. Sinuses/Orbits: No acute finding. Other: None. IMPRESSION: 1. No acute intracranial process. 2. Atrophy with extensive chronic microvascular ischemic changes. Electronically Signed   By: Thornell Sartorius M.D.   On: 06/25/2021 22:57   CT Lumbar Spine Wo Contrast  Result Date: 06/25/2021 CLINICAL DATA:  Back pain.  Lumbar compression fracture EXAM: CT LUMBAR SPINE WITHOUT CONTRAST TECHNIQUE: Multidetector CT imaging of the lumbar spine was performed without intravenous contrast administration. Multiplanar CT image reconstructions were also generated. COMPARISON:  Lumbar spine radiographs 06/16/2021 FINDINGS: Segmentation: 5 lumbar segments. Alignment: Mild to moderate lumbar scoliosis. Normal sagittal alignment. Vertebrae: Mild fracture superior endplate of L1 which appears acute or subacute. This was present on the prior radiographs. No other lumbar fracture. Paraspinal and other soft tissues: Atherosclerotic calcification aorta and iliacs without aneurysm. Bilateral renal cyst. No retroperitoneal adenopathy. Disc levels: L1-2: Diffuse disc bulging without spinal stenosis L2-3: Disc degeneration with disc bulging.  No significant stenosis. L3-4: Diffuse bulging of the disc and bilateral facet degeneration. Mild spinal stenosis. Subarticular stenosis bilaterally L4-5: Diffuse disc bulging with moderate facet hypertrophy. Moderate spinal stenosis. Moderate subarticular stenosis bilaterally L5-S1: Advanced disc degeneration. Marked disc space narrowing with endplate spurring which is mild. Bilateral facet hypertrophy. No significant stenosis. IMPRESSION: Mild superior  endplate fracture of L1 appears recent. No change from recent radiographs 06/16/2021. No other acute fracture Lumbar scoliosis and degenerative changes as above. Electronically Signed   By: Marlan Palau M.D.   On: 06/25/2021 17:13   MR BRAIN WO CONTRAST  Result Date: 06/26/2021 CLINICAL DATA:  Initial evaluation for mental status change, unknown cause. EXAM: MRI HEAD WITHOUT CONTRAST TECHNIQUE: Multiplanar, multiecho pulse sequences of the brain and surrounding structures were obtained without intravenous contrast. COMPARISON:  CT from 06/25/2021. FINDINGS: Brain: Generalized age-related cerebral atrophy with moderate chronic microvascular ischemic disease. Remote lacunar infarct present at the right basal ganglia. No abnormal foci of restricted diffusion to suggest acute or subacute ischemia. Gray-white matter differentiation maintained. No areas of remote cortical infarction. No evidence for acute or chronic intracranial hemorrhage. No mass lesion or mass effect. No hydrocephalus or extra-axial fluid collection. Pituitary gland suprasellar region normal. Midline structures intact. Vascular: Major intracranial vascular flow voids are maintained. Skull and upper cervical spine: Craniocervical junction within normal limits. Bone marrow signal intensity normal. No scalp soft tissue abnormality. Sinuses/Orbits: Patient status post bilateral ocular lens replacement. Globes and orbital soft tissues demonstrate no acute finding. Paranasal sinuses are clear. No significant  mastoid effusion. Other: None. IMPRESSION: 1. No acute intracranial abnormality. 2. Generalized age-related cerebral atrophy with moderate chronic microvascular ischemic disease, with remote right basal ganglia lacunar infarct. Electronically Signed   By: Rise Mu M.D.   On: 06/26/2021 03:55    Microbiology: Recent Results (from the past 240 hour(s))  Resp Panel by RT-PCR (Flu A&B, Covid) Nasopharyngeal Swab     Status: None    Collection Time: 06/26/21 12:00 AM   Specimen: Nasopharyngeal Swab; Nasopharyngeal(NP) swabs in vial transport medium  Result Value Ref Range Status   SARS Coronavirus 2 by RT PCR NEGATIVE NEGATIVE Final    Comment: (NOTE) SARS-CoV-2 target nucleic acids are NOT DETECTED.  The SARS-CoV-2 RNA is generally detectable in upper respiratory specimens during the acute phase of infection. The lowest concentration of SARS-CoV-2 viral copies this assay can detect is 138 copies/mL. A negative result does not preclude SARS-Cov-2 infection and should not be used as the sole basis for treatment or other patient management decisions. A negative result may occur with  improper specimen collection/handling, submission of specimen other than nasopharyngeal swab, presence of viral mutation(s) within the areas targeted by this assay, and inadequate number of viral copies(<138 copies/mL). A negative result must be combined with clinical observations, patient history, and epidemiological information. The expected result is Negative.  Fact Sheet for Patients:  BloggerCourse.com  Fact Sheet for Healthcare Providers:  SeriousBroker.it  This test is no t yet approved or cleared by the [place] FDA and  has been authorized for detection and/or diagnosis of SARS-CoV-2 by FDA under an Emergency Use Authorization (EUA). This EUA will remain  in effect (meaning this test can be used) for the duration of the COVID-19 declaration under Section 564(b)(1) of the Act, 21 U.S.C.section 360bbb-3(b)(1), unless the authorization is terminated  or revoked sooner.       Influenza A by PCR NEGATIVE NEGATIVE Final   Influenza B by PCR NEGATIVE NEGATIVE Final    Comment: (NOTE) The Xpert Xpress SARS-CoV-2/FLU/RSV plus assay is intended as an aid in the diagnosis of influenza from Nasopharyngeal swab specimens and should not be used as a sole basis for treatment.  Nasal washings and aspirates are unacceptable for Xpert Xpress SARS-CoV-2/FLU/RSV testing.  Fact Sheet for Patients: BloggerCourse.com  Fact Sheet for Healthcare Providers: SeriousBroker.it  This test is not yet approved or cleared by the [place] FDA and has been authorized for detection and/or diagnosis of SARS-CoV-2 by FDA under an Emergency Use Authorization (EUA). This EUA will remain in effect (meaning this test can be used) for the duration of the COVID-19 declaration under Section 564(b)(1) of the Act, 21 U.S.C. section 360bbb-3(b)(1), unless the authorization is terminated or revoked.  Performed at [facility], [address] [place], [place] [address]      Labs: Basic Metabolic Panel: Recent Labs  Lab 06/25/21 1410 06/26/21 0239 06/27/21 0233  NA 142 139 139  K 3.6 3.2* 4.2  CL 105 102 105  CO2 28 22 23   GLUCOSE 85 67* 79  BUN 19 28* 31*  CREATININE 1.47* 1.61* 1.56*  CALCIUM 8.9 8.9 9.1   Liver Function Tests: No results for input(s): AST, ALT, ALKPHOS, BILITOT, PROT, ALBUMIN in the last 168 hours. No results for input(s): LIPASE, AMYLASE in the last 168 hours. No results for input(s): AMMONIA in the last 168 hours. CBC: Recent Labs  Lab 06/25/21 1410 06/26/21 0239 06/27/21 0233  WBC 6.0 12.0* 10.8*  NEUTROABS 4.6  --   --  HGB 12.6 13.4 13.2  HCT 39.9 42.0 40.3  MCV 100.5* 99.8 98.1  PLT 226 250 241   Cardiac Enzymes: No results for input(s): CKTOTAL, CKMB, CKMBINDEX, TROPONINI in the last 168 hours. BNP: BNP (last 3 results) No results for input(s): BNP in the last 8760 hours.  ProBNP (last 3 results) No results for input(s): PROBNP in the last 8760 hours.  CBG: Recent Labs  Lab 06/25/21 2142  GLUCAP 95       Signed:  Meredeth Ide MD.  [facility] 06/27/2021, 11:56 AM

## 2021-08-03 ENCOUNTER — Encounter (HOSPITAL_COMMUNITY): Payer: Self-pay

## 2021-08-03 ENCOUNTER — Other Ambulatory Visit: Payer: Self-pay

## 2021-08-03 ENCOUNTER — Inpatient Hospital Stay (HOSPITAL_COMMUNITY)
Admission: EM | Admit: 2021-08-03 | Discharge: 2021-08-13 | DRG: 640 | Disposition: A | Discharge status: Hospice | Payer: Medicare PPO | Attending: Internal Medicine | Admitting: Internal Medicine

## 2021-08-03 ENCOUNTER — Emergency Department (HOSPITAL_COMMUNITY): Payer: Medicare PPO

## 2021-08-03 DIAGNOSIS — Z823 Family history of stroke: Secondary | ICD-10-CM

## 2021-08-03 DIAGNOSIS — M353 Polymyalgia rheumatica: Secondary | ICD-10-CM | POA: Diagnosis present

## 2021-08-03 DIAGNOSIS — Z515 Encounter for palliative care: Secondary | ICD-10-CM

## 2021-08-03 DIAGNOSIS — I480 Paroxysmal atrial fibrillation: Secondary | ICD-10-CM | POA: Diagnosis present

## 2021-08-03 DIAGNOSIS — Z90710 Acquired absence of both cervix and uterus: Secondary | ICD-10-CM

## 2021-08-03 DIAGNOSIS — I951 Orthostatic hypotension: Secondary | ICD-10-CM

## 2021-08-03 DIAGNOSIS — S0990XA Unspecified injury of head, initial encounter: Secondary | ICD-10-CM

## 2021-08-03 DIAGNOSIS — Y92239 Unspecified place in hospital as the place of occurrence of the external cause: Secondary | ICD-10-CM | POA: Diagnosis not present

## 2021-08-03 DIAGNOSIS — Z20822 Contact with and (suspected) exposure to covid-19: Secondary | ICD-10-CM | POA: Diagnosis present

## 2021-08-03 DIAGNOSIS — R55 Syncope and collapse: Secondary | ICD-10-CM | POA: Diagnosis not present

## 2021-08-03 DIAGNOSIS — I248 Other forms of acute ischemic heart disease: Secondary | ICD-10-CM | POA: Diagnosis present

## 2021-08-03 DIAGNOSIS — E876 Hypokalemia: Secondary | ICD-10-CM | POA: Diagnosis present

## 2021-08-03 DIAGNOSIS — Z888 Allergy status to other drugs, medicaments and biological substances status: Secondary | ICD-10-CM

## 2021-08-03 DIAGNOSIS — Z87891 Personal history of nicotine dependence: Secondary | ICD-10-CM

## 2021-08-03 DIAGNOSIS — R296 Repeated falls: Secondary | ICD-10-CM | POA: Diagnosis present

## 2021-08-03 DIAGNOSIS — W19XXXA Unspecified fall, initial encounter: Secondary | ICD-10-CM | POA: Diagnosis not present

## 2021-08-03 DIAGNOSIS — E43 Unspecified severe protein-calorie malnutrition: Secondary | ICD-10-CM | POA: Diagnosis present

## 2021-08-03 DIAGNOSIS — F03911 Unspecified dementia, unspecified severity, with agitation: Secondary | ICD-10-CM | POA: Diagnosis present

## 2021-08-03 DIAGNOSIS — M549 Dorsalgia, unspecified: Secondary | ICD-10-CM | POA: Diagnosis present

## 2021-08-03 DIAGNOSIS — Z66 Do not resuscitate: Secondary | ICD-10-CM | POA: Diagnosis present

## 2021-08-03 DIAGNOSIS — Z79899 Other long term (current) drug therapy: Secondary | ICD-10-CM

## 2021-08-03 DIAGNOSIS — I129 Hypertensive chronic kidney disease with stage 1 through stage 4 chronic kidney disease, or unspecified chronic kidney disease: Secondary | ICD-10-CM | POA: Diagnosis present

## 2021-08-03 DIAGNOSIS — S0003XA Contusion of scalp, initial encounter: Secondary | ICD-10-CM | POA: Diagnosis present

## 2021-08-03 DIAGNOSIS — Z8249 Family history of ischemic heart disease and other diseases of the circulatory system: Secondary | ICD-10-CM

## 2021-08-03 DIAGNOSIS — E860 Dehydration: Secondary | ICD-10-CM | POA: Diagnosis not present

## 2021-08-03 DIAGNOSIS — M8088XD Other osteoporosis with current pathological fracture, vertebra(e), subsequent encounter for fracture with routine healing: Secondary | ICD-10-CM | POA: Diagnosis present

## 2021-08-03 DIAGNOSIS — Z681 Body mass index (BMI) 19 or less, adult: Secondary | ICD-10-CM

## 2021-08-03 DIAGNOSIS — D509 Iron deficiency anemia, unspecified: Secondary | ICD-10-CM | POA: Diagnosis present

## 2021-08-03 DIAGNOSIS — Z880 Allergy status to penicillin: Secondary | ICD-10-CM

## 2021-08-03 DIAGNOSIS — Z881 Allergy status to other antibiotic agents status: Secondary | ICD-10-CM

## 2021-08-03 DIAGNOSIS — Y92002 Bathroom of unspecified non-institutional (private) residence single-family (private) house as the place of occurrence of the external cause: Secondary | ICD-10-CM

## 2021-08-03 DIAGNOSIS — N1832 Chronic kidney disease, stage 3b: Secondary | ICD-10-CM | POA: Diagnosis present

## 2021-08-03 DIAGNOSIS — R410 Disorientation, unspecified: Secondary | ICD-10-CM

## 2021-08-03 DIAGNOSIS — M25551 Pain in right hip: Secondary | ICD-10-CM | POA: Diagnosis not present

## 2021-08-03 DIAGNOSIS — Z91018 Allergy to other foods: Secondary | ICD-10-CM

## 2021-08-03 DIAGNOSIS — R627 Adult failure to thrive: Secondary | ICD-10-CM | POA: Diagnosis present

## 2021-08-03 DIAGNOSIS — F05 Delirium due to known physiological condition: Secondary | ICD-10-CM | POA: Diagnosis present

## 2021-08-03 DIAGNOSIS — G8929 Other chronic pain: Secondary | ICD-10-CM | POA: Diagnosis present

## 2021-08-03 DIAGNOSIS — Z7901 Long term (current) use of anticoagulants: Secondary | ICD-10-CM

## 2021-08-03 DIAGNOSIS — W1830XA Fall on same level, unspecified, initial encounter: Secondary | ICD-10-CM | POA: Diagnosis present

## 2021-08-03 DIAGNOSIS — Z7952 Long term (current) use of systemic steroids: Secondary | ICD-10-CM

## 2021-08-03 LAB — COMPREHENSIVE METABOLIC PANEL
ALT: 32 U/L (ref 0–44)
AST: 52 U/L — ABNORMAL HIGH (ref 15–41)
Albumin: 3.8 g/dL (ref 3.5–5.0)
Alkaline Phosphatase: 65 U/L (ref 38–126)
Anion gap: 10 (ref 5–15)
BUN: 24 mg/dL — ABNORMAL HIGH (ref 8–23)
CO2: 26 mmol/L (ref 22–32)
Calcium: 9 mg/dL (ref 8.9–10.3)
Chloride: 105 mmol/L (ref 98–111)
Creatinine, Ser: 1.41 mg/dL — ABNORMAL HIGH (ref 0.44–1.00)
GFR, Estimated: 36 mL/min — ABNORMAL LOW (ref >60)
Glucose, Bld: 116 mg/dL — ABNORMAL HIGH (ref 70–99)
Potassium: 4.1 mmol/L (ref 3.5–5.1)
Sodium: 141 mmol/L (ref 135–145)
Total Bilirubin: 1 mg/dL (ref 0.3–1.2)
Total Protein: 6.5 g/dL (ref 6.5–8.1)

## 2021-08-03 LAB — URINALYSIS, ROUTINE W REFLEX MICROSCOPIC
Bilirubin Urine: NEGATIVE
Glucose, UA: NEGATIVE mg/dL
Hgb urine dipstick: NEGATIVE
Ketones, ur: NEGATIVE mg/dL
Leukocytes,Ua: NEGATIVE
Nitrite: NEGATIVE
Protein, ur: NEGATIVE mg/dL
Specific Gravity, Urine: 1.015 (ref 1.005–1.030)
pH: 7 (ref 5.0–8.0)

## 2021-08-03 LAB — LACTIC ACID, PLASMA: Lactic Acid, Venous: 1.5 mmol/L (ref 0.5–1.9)

## 2021-08-03 LAB — TROPONIN I (HIGH SENSITIVITY)
Troponin I (High Sensitivity): 21 ng/L — ABNORMAL HIGH (ref <18)
Troponin I (High Sensitivity): 24 ng/L — ABNORMAL HIGH (ref <18)

## 2021-08-03 LAB — CBC
HCT: 38.4 % (ref 36.0–46.0)
Hemoglobin: 12.2 g/dL (ref 12.0–15.0)
MCH: 32.5 pg (ref 26.0–34.0)
MCHC: 31.8 g/dL (ref 30.0–36.0)
MCV: 102.4 fL — ABNORMAL HIGH (ref 80.0–100.0)
Platelets: 187 10*3/uL (ref 150–400)
RBC: 3.75 MIL/uL — ABNORMAL LOW (ref 3.87–5.11)
RDW: 14.2 % (ref 11.5–15.5)
WBC: 13.3 10*3/uL — ABNORMAL HIGH (ref 4.0–10.5)
nRBC: 0 % (ref 0.0–0.2)

## 2021-08-03 LAB — ETHANOL: Alcohol, Ethyl (B): <10 mg/dL (ref <10)

## 2021-08-03 LAB — I-STAT CHEM 8, ED
BUN: 27 mg/dL — ABNORMAL HIGH (ref 8–23)
Calcium, Ion: 1.06 mmol/L — ABNORMAL LOW (ref 1.15–1.40)
Chloride: 105 mmol/L (ref 98–111)
Creatinine, Ser: 1.5 mg/dL — ABNORMAL HIGH (ref 0.44–1.00)
Glucose, Bld: 110 mg/dL — ABNORMAL HIGH (ref 70–99)
HCT: 37 % (ref 36.0–46.0)
Hemoglobin: 12.6 g/dL (ref 12.0–15.0)
Potassium: 3.7 mmol/L (ref 3.5–5.1)
Sodium: 140 mmol/L (ref 135–145)
TCO2: 25 mmol/L (ref 22–32)

## 2021-08-03 LAB — PROTIME-INR
INR: 1.2 (ref 0.8–1.2)
Prothrombin Time: 15.1 seconds (ref 11.4–15.2)

## 2021-08-03 LAB — RESP PANEL BY RT-PCR (FLU A&B, COVID) ARPGX2
Influenza A by PCR: NEGATIVE
Influenza B by PCR: NEGATIVE
SARS Coronavirus 2 by RT PCR: NEGATIVE

## 2021-08-03 LAB — SAMPLE TO BLOOD BANK

## 2021-08-03 MED ORDER — ALPRAZOLAM 0.25 MG PO TABS
0.2500 mg | ORAL_TABLET | Freq: Two times a day (BID) | ORAL | Status: DC
  Administered 2021-08-03 – 2021-08-05 (×4): 0.25 mg via ORAL
  Filled 2021-08-03 (×4): qty 1

## 2021-08-03 MED ORDER — SODIUM CHLORIDE 0.9 % IV SOLN: INTRAVENOUS | Status: AC

## 2021-08-03 MED ORDER — HYDROXYCHLOROQUINE SULFATE 200 MG PO TABS
200.0000 mg | ORAL_TABLET | Freq: Every day | ORAL | Status: DC
  Administered 2021-08-04 – 2021-08-11 (×8): 200 mg via ORAL
  Filled 2021-08-03 (×9): qty 1

## 2021-08-03 MED ORDER — HYDRALAZINE HCL 25 MG PO TABS
25.0000 mg | ORAL_TABLET | Freq: Four times a day (QID) | ORAL | Status: DC | PRN
  Administered 2021-08-04 – 2021-08-10 (×3): 25 mg via ORAL
  Filled 2021-08-03 (×3): qty 1

## 2021-08-03 MED ORDER — POLYETHYLENE GLYCOL 3350 17 GM/SCOOP PO POWD
34.0000 g | Freq: Two times a day (BID) | ORAL | Status: DC | PRN
  Filled 2021-08-03: qty 255

## 2021-08-03 MED ORDER — IOHEXOL 300 MG/ML  SOLN
60.0000 mL | Freq: Once | INTRAMUSCULAR | Status: AC | PRN
  Administered 2021-08-03: 17:00:00 60 mL via INTRAVENOUS

## 2021-08-03 MED ORDER — PANTOPRAZOLE SODIUM 40 MG PO TBEC
40.0000 mg | DELAYED_RELEASE_TABLET | Freq: Every day | ORAL | Status: DC
  Administered 2021-08-03 – 2021-08-11 (×9): 40 mg via ORAL
  Filled 2021-08-03 (×9): qty 1

## 2021-08-03 MED ORDER — POLYETHYLENE GLYCOL 3350 17 G PO PACK: 34.0000 g | PACK | Freq: Two times a day (BID) | ORAL | Status: DC | PRN

## 2021-08-03 MED ORDER — LACTATED RINGERS IV BOLUS
500.0000 mL | Freq: Once | INTRAVENOUS | Status: AC
  Administered 2021-08-03: 17:00:00 500 mL via INTRAVENOUS

## 2021-08-03 MED ORDER — APIXABAN 2.5 MG PO TABS
2.5000 mg | ORAL_TABLET | Freq: Two times a day (BID) | ORAL | Status: DC
  Administered 2021-08-04 – 2021-08-11 (×16): 2.5 mg via ORAL
  Filled 2021-08-03 (×17): qty 1

## 2021-08-03 MED ORDER — FERROUS SULFATE 325 (65 FE) MG PO TABS
325.0000 mg | ORAL_TABLET | Freq: Every day | ORAL | Status: DC
  Administered 2021-08-03 – 2021-08-11 (×9): 325 mg via ORAL
  Filled 2021-08-03 (×9): qty 1

## 2021-08-03 MED ORDER — SENNOSIDES-DOCUSATE SODIUM 8.6-50 MG PO TABS: 1.0000 | ORAL_TABLET | Freq: Two times a day (BID) | ORAL | Status: DC | PRN

## 2021-08-03 MED ORDER — DILTIAZEM HCL ER COATED BEADS 120 MG PO CP24
120.0000 mg | ORAL_CAPSULE | Freq: Every day | ORAL | Status: DC
  Administered 2021-08-04 – 2021-08-11 (×8): 120 mg via ORAL
  Filled 2021-08-03 (×8): qty 1

## 2021-08-03 MED ORDER — CALCITONIN (SALMON) 200 UNIT/ACT NA SOLN: 1.0000 | Freq: Every day | NASAL | Status: DC

## 2021-08-03 MED ORDER — ACETAMINOPHEN 650 MG RE SUPP: 650.0000 mg | Freq: Four times a day (QID) | RECTAL | Status: DC | PRN

## 2021-08-03 MED ORDER — ONDANSETRON HCL 4 MG PO TABS: 4.0000 mg | ORAL_TABLET | Freq: Four times a day (QID) | ORAL | Status: DC | PRN

## 2021-08-03 MED ORDER — ACETAMINOPHEN 325 MG PO TABS
650.0000 mg | ORAL_TABLET | Freq: Once | ORAL | Status: AC
  Administered 2021-08-03: 17:00:00 650 mg via ORAL
  Filled 2021-08-03: qty 2

## 2021-08-03 MED ORDER — FENTANYL CITRATE PF 50 MCG/ML IJ SOSY
50.0000 ug | PREFILLED_SYRINGE | Freq: Once | INTRAMUSCULAR | Status: AC
  Administered 2021-08-03: 18:00:00 50 ug via INTRAVENOUS
  Filled 2021-08-03: qty 1

## 2021-08-03 MED ORDER — TRAMADOL HCL 50 MG PO TABS
50.0000 mg | ORAL_TABLET | Freq: Once | ORAL | Status: AC
  Administered 2021-08-03: 17:00:00 50 mg via ORAL
  Filled 2021-08-03: qty 1

## 2021-08-03 MED ORDER — BRINZOLAMIDE 1 % OP SUSP
1.0000 [drp] | Freq: Three times a day (TID) | OPHTHALMIC | Status: DC
  Administered 2021-08-03 – 2021-08-13 (×23): 1 [drp] via OPHTHALMIC
  Filled 2021-08-03 (×2): qty 10

## 2021-08-03 MED ORDER — PREDNISONE 5 MG PO TABS
5.0000 mg | ORAL_TABLET | Freq: Every day | ORAL | Status: DC
  Administered 2021-08-04 – 2021-08-11 (×8): 5 mg via ORAL
  Filled 2021-08-03 (×8): qty 1

## 2021-08-03 MED ORDER — LISINOPRIL 10 MG PO TABS
10.0000 mg | ORAL_TABLET | Freq: Every day | ORAL | Status: DC
  Administered 2021-08-04 – 2021-08-08 (×5): 10 mg via ORAL
  Filled 2021-08-03 (×5): qty 1

## 2021-08-03 MED ORDER — ONDANSETRON HCL 4 MG/2ML IJ SOLN: 4.0000 mg | Freq: Four times a day (QID) | INTRAMUSCULAR | Status: DC | PRN

## 2021-08-03 MED ORDER — CALCITONIN (SALMON) 200 UNIT/ACT NA SOLN
1.0000 | Freq: Every day | NASAL | Status: DC
  Administered 2021-08-04 – 2021-08-13 (×9): 1 via NASAL
  Filled 2021-08-03 (×2): qty 3.7

## 2021-08-03 MED ORDER — AMIODARONE HCL 200 MG PO TABS
200.0000 mg | ORAL_TABLET | Freq: Every day | ORAL | Status: DC
  Administered 2021-08-04 – 2021-08-11 (×8): 200 mg via ORAL
  Filled 2021-08-03 (×8): qty 1

## 2021-08-03 MED ORDER — CYCLOSPORINE 0.05 % OP EMUL
1.0000 [drp] | Freq: Two times a day (BID) | OPHTHALMIC | Status: DC
  Administered 2021-08-03 – 2021-08-13 (×15): 1 [drp] via OPHTHALMIC
  Filled 2021-08-03 (×22): qty 30

## 2021-08-03 MED ORDER — ACETAMINOPHEN 325 MG PO TABS
650.0000 mg | ORAL_TABLET | Freq: Four times a day (QID) | ORAL | Status: DC | PRN
  Administered 2021-08-07 – 2021-08-08 (×2): 650 mg via ORAL
  Filled 2021-08-03 (×2): qty 2

## 2021-08-03 MED ORDER — NETARSUDIL-LATANOPROST 0.02-0.005 % OP SOLN: 1.0000 [drp] | Freq: Every day | OPHTHALMIC | Status: DC

## 2021-08-03 MED ORDER — TRAMADOL-ACETAMINOPHEN 37.5-325 MG PO TABS
1.0000 | ORAL_TABLET | Freq: Three times a day (TID) | ORAL | Status: DC | PRN
  Administered 2021-08-03 – 2021-08-11 (×11): 1 via ORAL
  Filled 2021-08-03 (×14): qty 1

## 2021-08-03 NOTE — ED Provider Notes (Signed)
[facility] EMERGENCY DEPARTMENT Provider Note   CSN: [id number] Arrival date & time: 08/03/21  1433     History Chief Complaint  Patient presents with   Jodi Snyder is a 87 y.o. female.  HPI  87 year old female with a history of atrial fibrillation on Eliquis presenting as a level 2 trauma after a fall and syncopal episode at home with resultant head trauma.  The patient states that she was sitting on the toilet when she stood up and passed out.  She fell and struck her head and sustained a hematoma to the back of her scalp.  She denied any other injuries or complaints.  She was then transported to [facility] where she arrived GCS 15, ABC intact.  Of note, the patient was recently discharged from the hospital with an L1 compression fracture for which she was supposed to be wearing an LSO brace but has only intermittently been wearing it due to discomfort.  She has been following outpatient with neurosurgery and just completed physical therapy.  Denies any neurologic deficits.  She endorses primarily headache and neck pain.  Additionally complains of pain radiating across her chest.  Pain is sharp, shooting, moderate in severity.  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   Asymptomatic microscopic hematuria 01/05/2018   Paroxysmal atrial fibrillation ([place]) 01/05/2018   Scoliosis 10/18/2017   Thoracogenic scoliosis of thoracolumbar region 10/08/2017    Glaucoma 06/21/2016   Drug therapy 01/28/2016   Benign hypertension with CKD (chronic kidney disease) stage III 01/16/2016   Discoid lupus erythematosus 01/16/2016   Generalized anxiety disorder 01/16/2016   Osteoporosis 01/16/2016   Polymyalgia rheumatica ([place]) 01/16/2016   Closed fracture of distal end of left radius 11/11/2014   Dry eye syndrome 08/03/2012   Other states following surgery of eye and adnexa 04/21/2012   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     Family History  Problem Relation Age of Onset   Heart attack Mother    Stroke Father    Anuerysm Father     Social History   Tobacco Use   Smoking status: Former   Smokeless tobacco: Never  Scientific laboratory technician Use: Never used  Substance Use Topics   Alcohol use: No   Drug use: No    Home Medications Prior to Admission medications   Medication Sig Start Date End Date Taking? Authorizing Provider  ALPRAZolam Duanne Moron) 0.5 MG tablet Take 0.25-0.5 mg by mouth 2 (two) times daily. 04/20/18  Yes [provider]  amiodarone (PACERONE) 200 MG tablet Take 200 mg by mouth daily. 05/09/21  Yes  [provider]  brinzolamide (AZOPT) 1 % ophthalmic suspension Place 1 drop into both eyes in the morning and at bedtime.   Yes [provider]  calcitonin, salmon, (MIACALCIN/FORTICAL) 200 UNIT/ACT nasal spray SMARTSIG:Both Nares 07/06/21  Yes [provider]  cycloSPORINE (RESTASIS) 0.05 % ophthalmic emulsion Place 1 drop into both eyes 2 (two) times daily.    Yes [provider]  diltiazem (CARDIZEM CD) 120 MG 24 hr capsule TAKE 1 CAPSULE(120 MG) BY MOUTH DAILY Patient taking differently: Take 120 mg by mouth  daily. 09/24/20  Yes Camnitz, Will Hassell Done, MD  ELIQUIS 2.5 MG TABS tablet TAKE 1 TABLET BY MOUTH TWICE DAILY Patient taking differently: Take 2.5 mg by mouth 2 (two) times daily. 03/27/20  Yes Camnitz, Will Hassell Done, MD  esomeprazole (NEXIUM) 40 MG capsule Take 40 mg by mouth daily as needed (heartburn).   Yes [provider]  ferrous sulfate 325 (65 FE) MG tablet Take 325 mg by mouth daily.   Yes [provider]  furosemide (LASIX) 20 MG tablet Take 20 mg by mouth every Monday, Wednesday, and Friday. 04/01/21  Yes [provider]  hydroxychloroquine (PLAQUENIL) 200 MG tablet Take 200 mg by mouth daily.   Yes [provider]  lisinopril (ZESTRIL) 10 MG tablet Take 10 mg by mouth daily.   Yes [provider]  Multiple Vitamins-Minerals (ICAPS AREDS 2 PO) Take 2 capsules by mouth 2 (two) times daily.   Yes [provider]  polyethylene glycol powder (GLYCOLAX/MIRALAX) 17 GM/SCOOP powder Take 34 g by mouth 2 (two) times daily as needed for mild constipation.   Yes [provider]  potassium chloride (KLOR-CON) 10 MEQ tablet Take 10 mEq by mouth every Monday, Wednesday, and Friday. 04/01/21  Yes [provider]  predniSONE (DELTASONE) 5 MG tablet Take 5 mg by mouth daily.  01/13/18  Yes [provider]  ROCKLATAN 0.02-0.005 % SOLN Place 1 drop into both eyes at bedtime. 08/31/19  Yes [provider]  senna-docusate (SENOKOT-S) 8.6-50 MG tablet Take 1 tablet by mouth 2 (two) times daily as needed for moderate constipation. 01/14/20  Yes Mercy Riding, MD  traMADol-acetaminophen (ULTRACET) 37.5-325 MG tablet Take 1 tablet by mouth every 8 (eight) hours as needed for severe pain. 06/27/21  Yes Oswald Hillock, MD  meclizine (ANTIVERT) 12.5 MG tablet Take 12.5 mg by mouth 3 (three) times daily as needed for dizziness. Patient not taking: Reported on 08/03/2021 05/20/21   [provider]    Allergies    Penicillins,  Brimonidine, Other, Penicillin g, Timolol maleate, Sulfa antibiotics, and Sulfasalazine  Review of Systems   Review of Systems  Constitutional:  Negative for chills and fever.  HENT:  Negative for ear pain and sore throat.   Eyes:  Negative for pain and visual disturbance.  Respiratory:  Negative for cough and shortness of breath.   Cardiovascular:  Positive for chest pain. Negative for palpitations.  Gastrointestinal:  Negative for abdominal pain and vomiting.  Genitourinary:  Negative for dysuria and hematuria.  Musculoskeletal:  Negative for arthralgias and back pain.  Skin:  Negative for color change and rash.  Neurological:  Positive for syncope and headaches. Negative for seizures.  All other systems reviewed and are negative.  Physical Exam Updated Vital Signs BP 138/76    Pulse 62    Temp (!) 97.2 F (36.2 C) (Oral)    Resp 16    Ht 5\' 3"  (1.6 m)    Wt 45.4  kg    SpO2 100%    BMI 17.71 kg/m   Physical Exam Vitals and nursing note reviewed.  Constitutional:      General: She is not in acute distress.    Appearance: She is well-developed.     Comments: GCS 15, ABC intact  HENT:     Head: Normocephalic.     Comments: Hematoma to the posterior scalp Eyes:     Extraocular Movements: Extraocular movements intact.     Conjunctiva/sclera: Conjunctivae normal.     Pupils: Pupils are equal, round, and reactive to light.  Neck:     Comments: No midline tenderness to palpation of the cervical spine.  Range of motion intact. C-collar in place Cardiovascular:     Rate and Rhythm: Normal rate and regular rhythm.  Pulmonary:     Effort: Pulmonary effort is normal. No respiratory distress.     Breath sounds: Normal breath sounds.  Chest:     Comments: Clavicles stable nontender to AP compression.  Chest wall stable with mild bilateral chest wall tenderness to palpation. Abdominal:     Palpations: Abdomen is soft.     Tenderness: There is abdominal tenderness.     Comments: Mild  abdominal tenderness to palpation  Musculoskeletal:     Cervical back: Neck supple.     Comments: No midline tenderness to palpation of the thoracic or lumbar spine.  Extremities atraumatic with intact range of motion  Skin:    General: Skin is warm and dry.  Neurological:     Mental Status: She is alert.     Comments: Cranial nerves II through XII grossly intact.  Moving all 4 extremities spontaneously.  Sensation grossly intact all 4 extremities    ED Results / Procedures / Treatments   Labs (all labs ordered are listed, but only abnormal results are displayed) Labs Reviewed  CBC - Abnormal; Notable for the following components:      Result Value   WBC 13.3 (*)    RBC 3.75 (*)    MCV 102.4 (*)    All other components within normal limits  I-STAT CHEM 8, ED - Abnormal; Notable for the following components:   BUN 27 (*)    Creatinine, Ser 1.50 (*)    Glucose, Bld 110 (*)    Calcium, Ion 1.06 (*)    All other components within normal limits  RESP PANEL BY RT-PCR (FLU A&B, COVID) ARPGX2  URINALYSIS, ROUTINE W REFLEX MICROSCOPIC  PROTIME-INR  COMPREHENSIVE METABOLIC PANEL  ETHANOL  LACTIC ACID, PLASMA  SAMPLE TO BLOOD BANK  TROPONIN I (HIGH SENSITIVITY)  TROPONIN I (HIGH SENSITIVITY)    EKG EKG Interpretation  Date/Time:  Monday August 03 2021 15:36:37 EST Ventricular Rate:  64 PR Interval:  154 QRS Duration: 95 QT Interval:  446 QTC Calculation: 461 R Axis:   67 Text Interpretation: Sinus rhythm Anteroseptal infarct, age indeterminate Confirmed by Ernie Avena ([id number]) on 08/03/2021 3:40:32 PM  Radiology No results found.  Procedures Procedures   Medications Ordered in ED Medications  lactated ringers bolus 500 mL (has no administration in time range)  iohexol (OMNIPAQUE) 300 MG/ML solution 60 mL (60 mLs Intravenous Contrast Given 08/03/21 1631)    ED Course  I have reviewed the triage vital signs and the nursing notes.  Pertinent labs & imaging results  that were available during my care of the patient were reviewed by me and considered in my medical decision making (see chart for details).    MDM Rules/Calculators/A&P  87 year old female with a history of atrial fibrillation on Eliquis presenting as a level 2 trauma after a fall and syncopal episode at home with resultant head trauma.  The patient states that she was sitting on the toilet when she stood up and passed out.  She fell and struck her head and sustained a hematoma to the back of her scalp.  She denied any other injuries or complaints.  She was then transported to [facility] where she arrived GCS 15, ABC intact.  Of note, the patient was recently discharged from the hospital with an L1 compression fracture for which she was supposed to be wearing an LSO brace but has only intermittently been wearing it due to discomfort.  She has been following outpatient with neurosurgery and just completed physical therapy.  Denies any neurologic deficits.  She endorses primarily headache and neck pain.  Additionally complains of pain radiating across her chest.  Pain is sharp, shooting, moderate in severity.  On arrival, the patient was afebrile, mildly hypertensive BP 170/86, with a hematoma noted to her scalp posteriorly.  Patient presents following a syncopal episode after standing concerning for possible orthostatic hypotension versus cardiogenic etiology of syncope.  Syncope work-up initiated to include EKG and troponins x2 in addition to screening labs.  Trauma imaging obtained to include CT head, CT C-spine, CT chest abdomen pelvis, pending at time of signout.  Plan at time of signout to follow-up patient's syncope work-up and trauma work-up, plan for likely medical admission for syncope work-up and evaluation pending trauma clearance.  Signout given to Dr. Karle Starch at [phone].   Final Clinical Impression(s) / ED Diagnoses Final diagnoses:  Syncope, unspecified syncope type   Fall, initial encounter  Injury of head, initial encounter    Rx / DC Orders ED Discharge Orders     None        Regan Lemming, MD 08/03/21 1635

## 2021-08-03 NOTE — ED Triage Notes (Signed)
Pt bib [facility] from [facility]) where she had a syncopal episode after standing up from the toilet. Pt states she felt weak and stood up then only remembers waking up on the floor. Pt arrives complaining of shoulder blade pain and a headache. Pt presents AOx4 with a large hematoma to the back of her head and bruising to back of left leg. EMS gave Fentanyl en route. Pt does take Eliquis.  EMS vitals: 164/94, 70HR, 134CBG,

## 2021-08-03 NOTE — ED Provider Notes (Addendum)
Care of the patient assumed at the change of shift. Here for syncopal episode with fall and head injury. Also has recent lumbar compression fracture but has not been wearing her TLSO consistently. Awaiting labs and CT imaging with plan for admission.  Physical Exam  BP (!) 177/114    Pulse 73    Temp (!) 97.2 F (36.2 C) (Oral)    Resp (!) 21    Ht 5\' 3"  (1.6 m)    Wt 45.4 kg    SpO2 100%    BMI 17.71 kg/m   Physical Exam  ED Course/Procedures     Procedures  MDM  Labs and CT results reviewed. Patient complaining of headache and back pain. Given APAP and tramadol but not helping much. Additional fentanyl ordered. Medicine consulted for admission .  6:29 PM Spoke with Dr. , hospitalist, who will evaluate for admission.      Chipper Herb, MD 08/03/21 [phone]

## 2021-08-03 NOTE — ED Notes (Signed)
CT called, waiting for labs and open table

## 2021-08-03 NOTE — H&P (Signed)
History and Physical    Jodi Snyder [id number] DOB: 1932-05-10 DOA: 08/03/2021  PCP: [name]., MD (Confirm with patient/family/NH records and if not entered, this has to be entered at [facility] point of entry) Patient coming from: Home  I have personally briefly reviewed patient's old medical records in [place]  Chief Complaint: I passed out.  HPI: Jodi Snyder is a 87 y.o. female with medical history significant of chronic ambulation dysfunction on roller walker, PAF on Eliquis, HTN, CKD stage IIIb, chronic iron deficiency anemia, polymyalgia rheumatica on steroid and hydroxychloroquine, osteoporosis, chronic lumbar spine fracture on TLSO, came with syncope.  Patient lives by herself.  She just recently's been more forgetful.  She has a chronic problem of dehydration, for that she prepared 3 bottles of 8 oz water at bedside every day to prevent her to have sufficient fluid intake.  This morning she noticed that there is still about 2 bottles of water not finished from yesterday. She then went to bathroom feeling that she might have a BM soon. While sitting of the toilet bowl, she started to feel "feeling like I am about to pass out" and she was trying to stand up to reach her walker but missed and fell into the bathtub and knocked her head on the left side. She recovered her consciousness probably few minutes later. No biting of tongue or loss urine or BM.  She has a L1 compression fracture, and recently she was prescribed a result TLSO brace, but she has not been starting using it for ambulation.  She complained sharp-like headache on the left side and neck pain, and back pain which is chronic, no arms or legs pain.   ED Course: Blood pressure significantly elevated, no tachycardia.    CT head showed left parietal scalp hematoma.  Otherwise CT head and neck no acute fracture or dislocation.  CT thoracic L1 compression deformity and T3 compression fracture all chronic  appearance.  Review of Systems: As per HPI otherwise 14 point review of systems negative.    Past Medical History:  Diagnosis Date   Atrial fibrillation ([place])    Hypertension    Immune system disorder ([place])    Polymyalgia ([place])     Past Surgical History:  Procedure Laterality Date   ABDOMINAL HYSTERECTOMY     BLADDER SURGERY     OOPHORECTOMY       reports that she has quit smoking. She has never used smokeless tobacco. She reports that she does not drink alcohol and does not use drugs.  Allergies  Allergen Reactions   Penicillins Hives and Swelling    Has patient had a PCN reaction causing immediate rash, facial/tongue/throat swelling, SOB or lightheadedness with hypotension: Yes Has patient had a PCN reaction causing severe rash involving mucus membranes or skin necrosis: No Has patient had a PCN reaction that required hospitalization: No Has patient had a PCN reaction occurring within the last 10 years: No If all of the above answers are "NO", then may proceed with Cephalosporin use.    Brimonidine Other (See Comments)    Don't remember   Other     Nuts causes blisters No spices coffee   Penicillin G Other (See Comments)   Timolol Maleate Other (See Comments)    Pt becomes faint   Sulfa Antibiotics Rash   Sulfasalazine Rash    Family History  Problem Relation Age of Onset   Heart attack Mother    Stroke Father    Anuerysm  Father      Prior to Admission medications   Medication Sig Start Date End Date Taking? Authorizing Provider  ALPRAZolam Duanne Moron) 0.5 MG tablet Take 0.25-0.5 mg by mouth 2 (two) times daily. 04/20/18  Yes [provider]  amiodarone (PACERONE) 200 MG tablet Take 200 mg by mouth daily. 05/09/21  Yes [provider]  brinzolamide (AZOPT) 1 % ophthalmic suspension Place 1 drop into both eyes in the morning and at bedtime.   Yes [provider]  calcitonin, salmon, (MIACALCIN/FORTICAL) 200 UNIT/ACT nasal spray  SMARTSIG:Both Nares 07/06/21  Yes [provider]  cycloSPORINE (RESTASIS) 0.05 % ophthalmic emulsion Place 1 drop into both eyes 2 (two) times daily.    Yes [provider]  diltiazem (CARDIZEM CD) 120 MG 24 hr capsule TAKE 1 CAPSULE(120 MG) BY MOUTH DAILY Patient taking differently: Take 120 mg by mouth daily. 09/24/20  Yes Camnitz, Will Hassell Done, MD  ELIQUIS 2.5 MG TABS tablet TAKE 1 TABLET BY MOUTH TWICE DAILY Patient taking differently: Take 2.5 mg by mouth 2 (two) times daily. 03/27/20  Yes Camnitz, Will Hassell Done, MD  esomeprazole (NEXIUM) 40 MG capsule Take 40 mg by mouth daily as needed (heartburn).   Yes [provider]  ferrous sulfate 325 (65 FE) MG tablet Take 325 mg by mouth daily.   Yes [provider]  furosemide (LASIX) 20 MG tablet Take 20 mg by mouth every Monday, Wednesday, and Friday. 04/01/21  Yes [provider]  hydroxychloroquine (PLAQUENIL) 200 MG tablet Take 200 mg by mouth daily.   Yes [provider]  lisinopril (ZESTRIL) 10 MG tablet Take 10 mg by mouth daily.   Yes [provider]  Multiple Vitamins-Minerals (ICAPS AREDS 2 PO) Take 2 capsules by mouth 2 (two) times daily.   Yes [provider]  polyethylene glycol powder (GLYCOLAX/MIRALAX) 17 GM/SCOOP powder Take 34 g by mouth 2 (two) times daily as needed for mild constipation.   Yes [provider]  potassium chloride (KLOR-CON) 10 MEQ tablet Take 10 mEq by mouth every Monday, Wednesday, and Friday. 04/01/21  Yes [provider]  predniSONE (DELTASONE) 5 MG tablet Take 5 mg by mouth daily.  01/13/18  Yes [provider]  ROCKLATAN 0.02-0.005 % SOLN Place 1 drop into both eyes at bedtime. 08/31/19  Yes [provider]  senna-docusate (SENOKOT-S) 8.6-50 MG tablet Take 1 tablet by mouth 2 (two) times daily as needed for moderate constipation. 01/14/20  Yes Mercy Riding, MD  traMADol-acetaminophen (ULTRACET) 37.5-325 MG tablet  Take 1 tablet by mouth every 8 (eight) hours as needed for severe pain. 06/27/21  Yes Oswald Hillock, MD  meclizine (ANTIVERT) 12.5 MG tablet Take 12.5 mg by mouth 3 (three) times daily as needed for dizziness. Patient not taking: Reported on 08/03/2021 05/20/21   [provider]    Physical Exam: Vitals:   08/03/21 1531 08/03/21 1700 08/03/21 1730 08/03/21 1815  BP: 138/76 (!) 177/114 (!) 184/95 (!) 156/85  Pulse: 62 73 68 66  Resp: 16 (!) 21 19 18   Temp:      TempSrc:      SpO2: 100% 100% 100% 96%  Weight:      Height:        Constitutional: NAD, calm, comfortable Vitals:   08/03/21 1531 08/03/21 1700 08/03/21 1730 08/03/21 1815  BP: 138/76 (!) 177/114 (!) 184/95 (!) 156/85  Pulse: 62 73 68 66  Resp: 16 (!) 21 19 18   Temp:  TempSrc:      SpO2: 100% 100% 100% 96%  Weight:      Height:       Eyes: PERRL, lids and conjunctivae normal ENMT: Mucous membranes are dry. Posterior pharynx clear of any exudate or lesions.Normal dentition.  Neck: normal, supple, no masses, no thyromegaly Respiratory: clear to auscultation bilaterally, no wheezing, no crackles. Normal respiratory effort. No accessory muscle use.  Cardiovascular: Regular rate and rhythm, no murmurs / rubs / gallops. No extremity edema. 2+ pedal pulses. No carotid bruits.  Abdomen: no tenderness, no masses palpated. No hepatosplenomegaly. Bowel sounds positive.  Musculoskeletal: no clubbing / cyanosis. No joint deformity upper and lower extremities. Good ROM, no contractures. Normal muscle tone.  Skin: no rashes, lesions, ulcers. No induration Neurologic: CN 2-12 grossly intact. Sensation intact, DTR normal. Strength 5/5 in all 4.  Psychiatric: Normal judgment and insight. Alert and oriented x 3. Normal mood.     Labs on Admission: I have personally reviewed following labs and imaging studies  CBC: Recent Labs  Lab 08/03/21 1442 08/03/21 1506  WBC 13.3*  --   HGB 12.2 12.6  HCT 38.4 37.0  MCV  102.4*  --   PLT 187  --    Basic Metabolic Panel: Recent Labs  Lab 08/03/21 1442 08/03/21 1506  NA 141 140  K 4.1 3.7  CL 105 105  CO2 26  --   GLUCOSE 116* 110*  BUN 24* 27*  CREATININE 1.41* 1.50*  CALCIUM 9.0  --    GFR: Estimated Creatinine Clearance: 18.9 mL/min (A) (by C-G formula based on SCr of 1.5 mg/dL (H)). Liver Function Tests: Recent Labs  Lab 08/03/21 1442  AST 52*  ALT 32  ALKPHOS 65  BILITOT 1.0  PROT 6.5  ALBUMIN 3.8   No results for input(s): LIPASE, AMYLASE in the last 168 hours. No results for input(s): AMMONIA in the last 168 hours. Coagulation Profile: Recent Labs  Lab 08/03/21 1442  INR 1.2   Cardiac Enzymes: No results for input(s): CKTOTAL, CKMB, CKMBINDEX, TROPONINI in the last 168 hours. BNP (last 3 results) No results for input(s): PROBNP in the last 8760 hours. HbA1C: No results for input(s): HGBA1C in the last 72 hours. CBG: No results for input(s): GLUCAP in the last 168 hours. Lipid Profile: No results for input(s): CHOL, HDL, LDLCALC, TRIG, CHOLHDL, LDLDIRECT in the last 72 hours. Thyroid Function Tests: No results for input(s): TSH, T4TOTAL, FREET4, T3FREE, THYROIDAB in the last 72 hours. Anemia Panel: No results for input(s): VITAMINB12, FOLATE, FERRITIN, TIBC, IRON, RETICCTPCT in the last 72 hours. Urine analysis:    Component Value Date/Time   COLORURINE YELLOW 08/03/2021 1442   APPEARANCEUR CLEAR 08/03/2021 1442   LABSPEC 1.015 08/03/2021 1442   PHURINE 7.0 08/03/2021 1442   GLUCOSEU NEGATIVE 08/03/2021 1442   HGBUR NEGATIVE 08/03/2021 1442   BILIRUBINUR NEGATIVE 08/03/2021 1442   KETONESUR NEGATIVE 08/03/2021 1442   PROTEINUR NEGATIVE 08/03/2021 1442   NITRITE NEGATIVE 08/03/2021 1442   LEUKOCYTESUR NEGATIVE 08/03/2021 1442    Radiological Exams on Admission: CT HEAD WO CONTRAST  Result Date: 08/03/2021 CLINICAL DATA:  Syncopal episode with fall. EXAM: CT HEAD WITHOUT CONTRAST TECHNIQUE: Contiguous axial  images were obtained from the base of the skull through the vertex without intravenous contrast. COMPARISON:  06/26/2021 FINDINGS: Brain: Generalized atrophy. Chronic small-vessel ischemic changes of the white matter. No sign of acute infarction, mass lesion, hemorrhage, hydrocephalus or extra-axial collection. Vascular: There is atherosclerotic calcification of the major vessels at  the base of the brain. Skull: No skull fracture. Sinuses/Orbits: Clear/normal Other: Left parietal scalp hematoma. IMPRESSION: No acute intracranial injury. Left parietal scalp hematoma. No underlying skull fracture. Electronically Signed   By: Nelson Chimes M.D.   On: 08/03/2021 16:37   CT CERVICAL SPINE WO CONTRAST  Result Date: 08/03/2021 CLINICAL DATA:  Syncopal episode with fall and trauma to the head and neck. EXAM: CT CERVICAL SPINE WITHOUT CONTRAST TECHNIQUE: Multidetector CT imaging of the cervical spine was performed without intravenous contrast. Multiplanar CT image reconstructions were also generated. COMPARISON:  12/06/2020 FINDINGS: Alignment: Scoliotic curvature convex to the left. Skull base and vertebrae: Compression fracture at T1 with loss of height of about 10%. Compression fracture of T3 with loss of height anteriorly of 80% and posterior bowing of the posterior margin of the vertebral body. Fracture of the right facet at T3. Soft tissues and spinal canal: No soft tissue injury seen. Disc levels: Chronic degenerative spondylosis at C5-6. No compressive stenosis visible. Upper chest: See results of chest CT. Other: None IMPRESSION: Cervicothoracic curvature convex to the left. No fracture seen in the cervical region. Compression fracture at T1 with loss of height of about 10%. Compression fracture at T3 with loss of height anteriorly of 80%. Posterior bowing of the posterior margin of the vertebral body. Fracture of the T3 right facet. Electronically Signed   By: Nelson Chimes M.D.   On: 08/03/2021 16:40   CT  CHEST ABDOMEN PELVIS W CONTRAST  Result Date: 08/03/2021 CLINICAL DATA:  Recent syncopal episode with pain, initial encounter EXAM: CT CHEST, ABDOMEN, AND PELVIS WITH CONTRAST TECHNIQUE: Multidetector CT imaging of the chest, abdomen and pelvis was performed following the standard protocol during bolus administration of intravenous contrast. CONTRAST:  71mL OMNIPAQUE IOHEXOL 300 MG/ML  SOLN COMPARISON:  12/06/2020 FINDINGS: CT CHEST FINDINGS Cardiovascular: Atherosclerotic calcifications of the thoracic aorta are noted without aneurysmal dilatation or dissection. No cardiac enlargement is noted. Mild coronary calcifications are seen. No large central pulmonary embolus is noted although timing was not performed for embolus evaluation. Mediastinum/Nodes: Thoracic inlet is within normal limits. No sizable hilar or mediastinal adenopathy noted. The esophagus as visualized is within normal limits. Lungs/Pleura: Few scattered calcified granulomas are seen. The lungs are well aerated. Chronic scarring with calcification is noted in the upper lobes bilaterally stable from previous exam from 12/06/2020. Musculoskeletal: Degenerative changes of the thoracic spine are seen. No acute rib fracture is noted. Chronic appearing T3 compression deformity is noted which is new from the prior exam. Old healed sternal fracture is noted. CT ABDOMEN PELVIS FINDINGS Hepatobiliary: No focal liver abnormality is seen. No gallstones, gallbladder wall thickening, or biliary dilatation. Pancreas: Unremarkable. No pancreatic ductal dilatation or surrounding inflammatory changes. Spleen: Normal in size without focal abnormality. Adrenals/Urinary Tract: Adrenal glands are within normal limits bilaterally. Kidneys demonstrate cystic changes bilaterally. No renal calculi or obstructive changes are noted. Delayed images demonstrate normal excretion of contrast material. Bladder is well distended and within normal limits. Stomach/Bowel: The  appendix is not well visualized although no inflammatory changes are identified to suggest appendicitis. Scattered fecal material is noted throughout the colon. Small bowel and stomach are within normal limits. Vascular/Lymphatic: Aortic atherosclerosis. No enlarged abdominal or pelvic lymph nodes. Reproductive: Status post hysterectomy. No adnexal masses. Other: No abdominal wall hernia or abnormality. No abdominopelvic ascites. Musculoskeletal: Degenerative changes of lumbar spine are noted. L1 compression deformity is noted which is new from prior examination from April of 2022 but stable from prior  plain film from 06/16/2021. IMPRESSION: CT of the chest: Changes of prior granulomatous disease as well as calcified and noncalcified areas of scarring in the upper lobes bilaterally. T3 compression deformity new from prior exam in April of 2022 but of a chronic appearance. CT of the abdomen and pelvis: L1 compression deformity which is stable from prior plain film examination in November of 2022. No acute abnormality is noted. Electronically Signed   By: Inez Catalina M.D.   On: 08/03/2021 16:44    EKG: Independently reviewed. Sinus with frequent PVCs  Assessment/Plan Principal Problem:   Syncope Active Problems:   Syncope, vasovagal  (please populate well all problems here in Problem List. (For example, if patient is on BP meds at home and you resume or decide to hold them, it is a problem that needs to be her. Same for CAD, COPD, HLD and so on)  Syncope, vasovagal -Clinically appears to be significantly dehydrated, blood pressure elevated probably rebound hypertension. -Plan to give her maintenance IV fluids 100 mL/h for 1 day then check orthostatic vital signs x2 -No recent Echo, but she had a normal stress test last year, plan to not order Echo this time.  L1 compression fracture -From a fall sustained last month -Stable appearance on CT scan today. TLSO and PT evaluation.  T3 compression  fracture -Radiology's reading as new finding compared to the Ct scan in April 2022 but chronic in nature. Patient does not have any new back pain after today's fall and appears to be moving her bowel and urine without problem. She confirmed that she has BM every day. -Continue calcitonin nasal spray  PAF -Hold Eliqis tonight, check CBC in AM, if H/H stable, restart Eliquis. -Continue Amiodarone and Cardizem  HTN, uncontrolled -Continue lisinopril. -Add PRN Hydralazine  Polymyalgia rheumatica -Continue prednisone 5 mg daily and hydroxychloroquine -She is on PPI.   DVT prophylaxis: Eliquis, starting tomorrow Code Status: DNR Family Communication: [place] over the phone Disposition Plan: Expect less than 2 midnight hospital stay, expect home PT Consults called: None Admission status: Tele obs   Lequita Halt MD [facility] Pager [phone]  08/03/2021, 6:32 PM

## 2021-08-03 NOTE — Progress Notes (Signed)
Orthopedic Tech Progress Note Patient Details:  Jodi Snyder 1931/11/21 [id number]  Level 2 trauma  Patient ID: Jodi Snyder, female   DOB: 1931/11/15, 87 y.o.   MRN: [id number]  [name] 08/03/2021, 2:36 PM

## 2021-08-03 NOTE — Progress Notes (Signed)
°   08/03/21 1545  Clinical Encounter Type  Visited With Patient not available  Visit Type Initial;Trauma  Referral From Nurse  Consult/Referral To Chaplain   Chaplain Tery Sanfilippo responded. The patient is being attended to by the medical team. There are no support person present. Chaplain remains available.This note was prepared by Deneen Harts, M.Div..  For questions please contact by phone [phone].

## 2021-08-04 DIAGNOSIS — S0990XA Unspecified injury of head, initial encounter: Secondary | ICD-10-CM

## 2021-08-04 DIAGNOSIS — R55 Syncope and collapse: Secondary | ICD-10-CM | POA: Diagnosis not present

## 2021-08-04 DIAGNOSIS — W19XXXA Unspecified fall, initial encounter: Secondary | ICD-10-CM | POA: Diagnosis not present

## 2021-08-04 LAB — BASIC METABOLIC PANEL
Anion gap: 9 (ref 5–15)
BUN: 19 mg/dL (ref 8–23)
CO2: 25 mmol/L (ref 22–32)
Calcium: 8.4 mg/dL — ABNORMAL LOW (ref 8.9–10.3)
Chloride: 106 mmol/L (ref 98–111)
Creatinine, Ser: 1.22 mg/dL — ABNORMAL HIGH (ref 0.44–1.00)
GFR, Estimated: 43 mL/min — ABNORMAL LOW (ref >60)
Glucose, Bld: 82 mg/dL (ref 70–99)
Potassium: 3.4 mmol/L — ABNORMAL LOW (ref 3.5–5.1)
Sodium: 140 mmol/L (ref 135–145)

## 2021-08-04 LAB — CBC
HCT: 35.2 % — ABNORMAL LOW (ref 36.0–46.0)
Hemoglobin: 10.8 g/dL — ABNORMAL LOW (ref 12.0–15.0)
MCH: 32 pg (ref 26.0–34.0)
MCHC: 30.7 g/dL (ref 30.0–36.0)
MCV: 104.1 fL — ABNORMAL HIGH (ref 80.0–100.0)
Platelets: 156 10*3/uL (ref 150–400)
RBC: 3.38 MIL/uL — ABNORMAL LOW (ref 3.87–5.11)
RDW: 14.4 % (ref 11.5–15.5)
WBC: 6.8 10*3/uL (ref 4.0–10.5)
nRBC: 0 % (ref 0.0–0.2)

## 2021-08-04 MED ORDER — HALOPERIDOL LACTATE 5 MG/ML IJ SOLN
1.0000 mg | Freq: Four times a day (QID) | INTRAMUSCULAR | Status: DC | PRN
  Administered 2021-08-04 (×2): 1 mg via INTRAVENOUS
  Filled 2021-08-04 (×2): qty 1

## 2021-08-04 MED ORDER — POTASSIUM CHLORIDE CRYS ER 20 MEQ PO TBCR
40.0000 meq | EXTENDED_RELEASE_TABLET | Freq: Once | ORAL | Status: AC
  Administered 2021-08-04: 07:00:00 40 meq via ORAL
  Filled 2021-08-04: qty 2

## 2021-08-04 MED ORDER — QUETIAPINE FUMARATE 25 MG PO TABS
12.5000 mg | ORAL_TABLET | Freq: Every day | ORAL | Status: DC
  Administered 2021-08-04: 21:00:00 12.5 mg via ORAL
  Filled 2021-08-04 (×2): qty 1

## 2021-08-04 NOTE — Progress Notes (Addendum)
[facility]                                                                              Patient Demographics  Jodi Snyder, is a 87 y.o. female, DOB - 02-Jul-1932, KI:[id number]  Admit date - 08/03/2021   Admitting Physician Lequita Halt, MD  Outpatient Primary MD for the patient is [name]., MD  Outpatient specialists:   LOS - 0  days   Medical records reviewed and are as summarized below:    Chief Complaint  Patient presents with   Fall       Brief summary   Patient is a 87 year old female with chronic ambulation dysfunction on rolling walker, PAF on Eliquis, HTN, CKD stage IIIb, chronic iron deficiency anemia, PMR on steroids, hydroxychloroquine, osteoporosis, chronic lumbar spine fracture on TLSO presented with syncopal episode. Patient lives by herself, has been getting recently more forgetful.  She has chronic problem of dehydration and prepares 3 bottles of 8 ounces water at bedside every day to prevent that.  On the morning of admission she noticed that she still had 2 bottles of water not finished from yesterday.  Patient went to use the bathroom and while sitting on the toilet, started to feel dizzy.  When she stood up to reach her walker, she missed and fell into the bathtub and hit her head on the left side.  Patient has a known L1 compression fracture and was recently prescribed a TLSO brace but had not been using it for ambulation.  She also reported headache on the left and neck pain, back pain which is chronic. CT head showed left parietal scalp hematoma otherwise CT head and neck showed no acute fracture or dislocation.  CT spine showed L1 and T3 compression fracture, chronic   Assessment & Plan    Principal Problem:   Syncope -Likely vasovagal, dehydration -Patient was placed on IV fluid hydration, follow orthostatic vitals -2D echo 01/2017 had shown EF of 60 to 65%, G2 DD.  Nuclear medicine stress test EF 65%, low risk study in  12/2019 -Troponins 24-21, EKG showed rate 64, normal sinus rhythm, no acute ST-T wave changes suggestive of  ischemia.  Active Problems: Acute on chronic back pain, T3 compression fracture, L1 compression fracture -Patient had L1 compression fracture from a fall sustained last month, stable appearance on CT.  She was recently prescribed vasopressin PT evaluation -CT C-spine showed no fractures, compression fracture at T1, T3 with loss of height anteriorly 80%, posterior bowing of the posterior margin of vertebral body, fracture of the T3 right facet, likely chronic per CT chest abdomen pelvis. -Need PT evaluation, patient lives home alone, frequent falls and memory issues, may need higher level of care or more supervision - Will place TLSO brace, PT eval    Dementia with agitation -Patient having sundowning and agitation.  She had an MRI in 06/2021 which had shown generalized age-related cerebral atrophy, chronic microvascular ischemic disease, likely has underlying dementia.  Having falls and memory issues. -QTC 461, will add as needed Haldol for agitation, Seroquel 12.5 mg at bedtime.  Paroxysmal atrial fibrillation -Rate controlled, continue Cardizem,  amiodaro -Continue apixaban 2.5 mg twice daily  Essential hypertension -Continue lisinopril, as needed hydralazine  Polymyalgia rheumatica -Continue prednisone 5 mg daily, hydroxychloroquine, -Continue PPI  Underweight, protein calorie malnutrition Estimated body mass index is 17.71 kg/m as calculated from the following:   Height as of this encounter: 5\' 3"  (1.6 m).   Weight as of this encounter: 45.4 kg.  Code Status: DNR DVT Prophylaxis:  apixaban (ELIQUIS) tablet 2.5 mg Start: 08/04/21 1000 apixaban (ELIQUIS) tablet 2.5 mg   Level of Care: Level of care: Telemetry Medical Family Communication: Discussed all imaging results, lab results, explained to the patient's son, Joellen Yamane on the phone.  She lives alone and son would not  be able to provide 24/7 supervision/care.  She has been in a rehab in the past at [facility] and family had already been looking into getting her into an assisted living facility due to memory issues and falls.   Disposition Plan:     Status is: Observation  The patient remains OBS appropriate and will d/c before 2 midnights.  Await PT evaluation.  Time Spent in minutes   35 minutes  Procedures:  None  Consultants:   None  Antimicrobials:   Anti-infectives (From admission, onward)    Start     Dose/Rate Route Frequency Ordered Stop   08/04/21 1000  hydroxychloroquine (PLAQUENIL) tablet 200 mg        200 mg Oral Daily 08/03/21 1831            Medications  Scheduled Meds:  ALPRAZolam  0.25 mg Oral BID   amiodarone  200 mg Oral Daily   apixaban  2.5 mg Oral BID   brinzolamide  1 drop Both Eyes TID   calcitonin (salmon)  1 spray Alternating Nares Daily   cycloSPORINE  1 drop Both Eyes BID   diltiazem  120 mg Oral Daily   ferrous sulfate  325 mg Oral Daily   hydroxychloroquine  200 mg Oral Daily   lisinopril  10 mg Oral Daily   Netarsudil-Latanoprost  1 drop Both Eyes QHS   pantoprazole  40 mg Oral Daily   predniSONE  5 mg Oral Daily   Continuous Infusions:  sodium chloride 100 mL/hr at 08/04/21 0737   PRN Meds:.acetaminophen **OR** acetaminophen, hydrALAZINE, ondansetron **OR** ondansetron (ZOFRAN) IV, polyethylene glycol, senna-docusate, traMADol-acetaminophen      Subjective:   Deneen Harts was seen and examined today.  Complaining of a lot of pain in the back, anxious.  Currently no dizziness, chest pain or shortness of breath.  No fevers.  Objective:   Vitals:   08/04/21 0830 08/04/21 0845 08/04/21 0900 08/04/21 0915  BP: 140/82 (!) 145/75 (!) 157/74 (!) 147/92  Pulse: 63 70 72 71  Resp: (!) 24 17 18 18   Temp:      TempSrc:      SpO2: 99% 96% 100% 98%  Weight:      Height:        Intake/Output Summary (Last 24 hours) at 08/04/2021 1013 Last  data filed at 08/03/2021 1531 Gross per 24 hour  Intake 0 ml  Output 0 ml  Net 0 ml     Wt Readings from Last 3 Encounters:  08/03/21 45.4 kg  01/28/20 44.8 kg  01/14/20 43.5 kg     Exam General: Alert and oriented x 3, NAD Cardiovascular: S1 S2 auscultated,  RRR Respiratory: Clear to auscultation bilaterally, no wheezing, rales or rhonchi Gastrointestinal: Soft, nontender, nondistended, + bowel sounds Ext: no pedal  edema bilaterally Neuro: no new deficits Psych: somewhat anxious   Data Reviewed:  I have personally reviewed following labs and imaging studies  Micro Results Recent Results (from the past 240 hour(s))  Resp Panel by RT-PCR (Flu A&B, Covid) Nasopharyngeal Swab     Status: None   Collection Time: 08/03/21  2:42 PM   Specimen: Nasopharyngeal Swab; Nasopharyngeal(NP) swabs in vial transport medium  Result Value Ref Range Status   SARS Coronavirus 2 by RT PCR NEGATIVE NEGATIVE Final    Comment: (NOTE) SARS-CoV-2 target nucleic acids are NOT DETECTED.  The SARS-CoV-2 RNA is generally detectable in upper respiratory specimens during the acute phase of infection. The lowest concentration of SARS-CoV-2 viral copies this assay can detect is 138 copies/mL. A negative result does not preclude SARS-Cov-2 infection and should not be used as the sole basis for treatment or other patient management decisions. A negative result may occur with  improper specimen collection/handling, submission of specimen other than nasopharyngeal swab, presence of viral mutation(s) within the areas targeted by this assay, and inadequate number of viral copies(<138 copies/mL). A negative result must be combined with clinical observations, patient history, and epidemiological information. The expected result is Negative.  Fact Sheet for Patients:  EntrepreneurPulse.com.au  Fact Sheet for Healthcare Providers:  IncredibleEmployment.be  This test is  no t yet approved or cleared by the [place] FDA and  has been authorized for detection and/or diagnosis of SARS-CoV-2 by FDA under an Emergency Use Authorization (EUA). This EUA will remain  in effect (meaning this test can be used) for the duration of the COVID-19 declaration under Section 564(b)(1) of the Act, 21 U.S.C.section 360bbb-3(b)(1), unless the authorization is terminated  or revoked sooner.       Influenza A by PCR NEGATIVE NEGATIVE Final   Influenza B by PCR NEGATIVE NEGATIVE Final    Comment: (NOTE) The Xpert Xpress SARS-CoV-2/FLU/RSV plus assay is intended as an aid in the diagnosis of influenza from Nasopharyngeal swab specimens and should not be used as a sole basis for treatment. Nasal washings and aspirates are unacceptable for Xpert Xpress SARS-CoV-2/FLU/RSV testing.  Fact Sheet for Patients: EntrepreneurPulse.com.au  Fact Sheet for Healthcare Providers: IncredibleEmployment.be  This test is not yet approved or cleared by the [place] FDA and has been authorized for detection and/or diagnosis of SARS-CoV-2 by FDA under an Emergency Use Authorization (EUA). This EUA will remain in effect (meaning this test can be used) for the duration of the COVID-19 declaration under Section 564(b)(1) of the Act, 21 U.S.C. section 360bbb-3(b)(1), unless the authorization is terminated or revoked.  Performed at [facility], [place] [address], [place], [place] [address]     Radiology Reports CT HEAD WO CONTRAST  Result Date: 08/03/2021 CLINICAL DATA:  Syncopal episode with fall. EXAM: CT HEAD WITHOUT CONTRAST TECHNIQUE: Contiguous axial images were obtained from the base of the skull through the vertex without intravenous contrast. COMPARISON:  06/26/2021 FINDINGS: Brain: Generalized atrophy. Chronic small-vessel ischemic changes of the white matter. No sign of acute infarction, mass lesion, hemorrhage, hydrocephalus or  extra-axial collection. Vascular: There is atherosclerotic calcification of the major vessels at the base of the brain. Skull: No skull fracture. Sinuses/Orbits: Clear/normal Other: Left parietal scalp hematoma. IMPRESSION: No acute intracranial injury. Left parietal scalp hematoma. No underlying skull fracture. Electronically Signed   By: Nelson Chimes M.D.   On: 08/03/2021 16:37   CT CERVICAL SPINE WO CONTRAST  Result Date: 08/03/2021 CLINICAL DATA:  Syncopal episode with  fall and trauma to the head and neck. EXAM: CT CERVICAL SPINE WITHOUT CONTRAST TECHNIQUE: Multidetector CT imaging of the cervical spine was performed without intravenous contrast. Multiplanar CT image reconstructions were also generated. COMPARISON:  12/06/2020 FINDINGS: Alignment: Scoliotic curvature convex to the left. Skull base and vertebrae: Compression fracture at T1 with loss of height of about 10%. Compression fracture of T3 with loss of height anteriorly of 80% and posterior bowing of the posterior margin of the vertebral body. Fracture of the right facet at T3. Soft tissues and spinal canal: No soft tissue injury seen. Disc levels: Chronic degenerative spondylosis at C5-6. No compressive stenosis visible. Upper chest: See results of chest CT. Other: None IMPRESSION: Cervicothoracic curvature convex to the left. No fracture seen in the cervical region. Compression fracture at T1 with loss of height of about 10%. Compression fracture at T3 with loss of height anteriorly of 80%. Posterior bowing of the posterior margin of the vertebral body. Fracture of the T3 right facet. Electronically Signed   By: Paulina Fusi M.D.   On: 08/03/2021 16:40   CT CHEST ABDOMEN PELVIS W CONTRAST  Result Date: 08/03/2021 CLINICAL DATA:  Recent syncopal episode with pain, initial encounter EXAM: CT CHEST, ABDOMEN, AND PELVIS WITH CONTRAST TECHNIQUE: Multidetector CT imaging of the chest, abdomen and pelvis was performed following the standard  protocol during bolus administration of intravenous contrast. CONTRAST:  57mL OMNIPAQUE IOHEXOL 300 MG/ML  SOLN COMPARISON:  12/06/2020 FINDINGS: CT CHEST FINDINGS Cardiovascular: Atherosclerotic calcifications of the thoracic aorta are noted without aneurysmal dilatation or dissection. No cardiac enlargement is noted. Mild coronary calcifications are seen. No large central pulmonary embolus is noted although timing was not performed for embolus evaluation. Mediastinum/Nodes: Thoracic inlet is within normal limits. No sizable hilar or mediastinal adenopathy noted. The esophagus as visualized is within normal limits. Lungs/Pleura: Few scattered calcified granulomas are seen. The lungs are well aerated. Chronic scarring with calcification is noted in the upper lobes bilaterally stable from previous exam from 12/06/2020. Musculoskeletal: Degenerative changes of the thoracic spine are seen. No acute rib fracture is noted. Chronic appearing T3 compression deformity is noted which is new from the prior exam. Old healed sternal fracture is noted. CT ABDOMEN PELVIS FINDINGS Hepatobiliary: No focal liver abnormality is seen. No gallstones, gallbladder wall thickening, or biliary dilatation. Pancreas: Unremarkable. No pancreatic ductal dilatation or surrounding inflammatory changes. Spleen: Normal in size without focal abnormality. Adrenals/Urinary Tract: Adrenal glands are within normal limits bilaterally. Kidneys demonstrate cystic changes bilaterally. No renal calculi or obstructive changes are noted. Delayed images demonstrate normal excretion of contrast material. Bladder is well distended and within normal limits. Stomach/Bowel: The appendix is not well visualized although no inflammatory changes are identified to suggest appendicitis. Scattered fecal material is noted throughout the colon. Small bowel and stomach are within normal limits. Vascular/Lymphatic: Aortic atherosclerosis. No enlarged abdominal or pelvic lymph  nodes. Reproductive: Status post hysterectomy. No adnexal masses. Other: No abdominal wall hernia or abnormality. No abdominopelvic ascites. Musculoskeletal: Degenerative changes of lumbar spine are noted. L1 compression deformity is noted which is new from prior examination from April of 2022 but stable from prior plain film from 06/16/2021. IMPRESSION: CT of the chest: Changes of prior granulomatous disease as well as calcified and noncalcified areas of scarring in the upper lobes bilaterally. T3 compression deformity new from prior exam in April of 2022 but of a chronic appearance. CT of the abdomen and pelvis: L1 compression deformity which is stable from prior plain film  examination in November of 2022. No acute abnormality is noted. Electronically Signed   By: Inez Catalina M.D.   On: 08/03/2021 16:44    Lab Data:  CBC: Recent Labs  Lab 08/03/21 1442 08/03/21 1506 08/04/21 0443  WBC 13.3*  --  6.8  HGB 12.2 12.6 10.8*  HCT 38.4 37.0 35.2*  MCV 102.4*  --  104.1*  PLT 187  --  [id number]   Basic Metabolic Panel: Recent Labs  Lab 08/03/21 1442 08/03/21 1506 08/04/21 0443  NA 141 140 140  K 4.1 3.7 3.4*  CL 105 105 106  CO2 26  --  25  GLUCOSE 116* 110* 82  BUN 24* 27* 19  CREATININE 1.41* 1.50* 1.22*  CALCIUM 9.0  --  8.4*   GFR: Estimated Creatinine Clearance: 23.3 mL/min (A) (by C-G formula based on SCr of 1.22 mg/dL (H)). Liver Function Tests: Recent Labs  Lab 08/03/21 1442  AST 52*  ALT 32  ALKPHOS 65  BILITOT 1.0  PROT 6.5  ALBUMIN 3.8   No results for input(s): LIPASE, AMYLASE in the last 168 hours. No results for input(s): AMMONIA in the last 168 hours. Coagulation Profile: Recent Labs  Lab 08/03/21 1442  INR 1.2   Cardiac Enzymes: No results for input(s): CKTOTAL, CKMB, CKMBINDEX, TROPONINI in the last 168 hours. BNP (last 3 results) No results for input(s): PROBNP in the last 8760 hours. HbA1C: No results for input(s): HGBA1C in the last 72  hours. CBG: No results for input(s): GLUCAP in the last 168 hours. Lipid Profile: No results for input(s): CHOL, HDL, LDLCALC, TRIG, CHOLHDL, LDLDIRECT in the last 72 hours. Thyroid Function Tests: No results for input(s): TSH, T4TOTAL, FREET4, T3FREE, THYROIDAB in the last 72 hours. Anemia Panel: No results for input(s): VITAMINB12, FOLATE, FERRITIN, TIBC, IRON, RETICCTPCT in the last 72 hours. Urine analysis:    Component Value Date/Time   COLORURINE YELLOW 08/03/2021 [place] 08/03/2021 1442   LABSPEC 1.015 08/03/2021 1442   PHURINE 7.0 08/03/2021 1442   GLUCOSEU NEGATIVE 08/03/2021 1442   HGBUR NEGATIVE 08/03/2021 1442   BILIRUBINUR NEGATIVE 08/03/2021 1442   KETONESUR NEGATIVE 08/03/2021 1442   PROTEINUR NEGATIVE 08/03/2021 1442   NITRITE NEGATIVE 08/03/2021 1442   LEUKOCYTESUR NEGATIVE 08/03/2021 1442     Celsa Nordahl M.D. [facility] 08/04/2021, 10:13 AM  Available via Epic secure chat 7am-7pm After 7 pm, please refer to night coverage provider listed on amion.

## 2021-08-04 NOTE — ED Notes (Signed)
ED TO INPATIENT HANDOFF REPORT  ED Nurse Name and Phone #: Gemini Beaumier, 30  S Name/Age/Gender Jodi Snyder 87 y.o. female Room/Bed: 009C/009C  Code Status   Code Status: DNR  Home/SNF/Other Skilled nursing facility Patient oriented to: self and situation Is this baseline? No   Triage Complete: Triage complete  Chief Complaint Syncope [R55]  Triage Note Pt bib [facility] from [facility]) where she had a syncopal episode after standing up from the toilet. Pt states she felt weak and stood up then only remembers waking up on the floor. Pt arrives complaining of shoulder blade pain and a headache. Pt presents AOx4 with a large hematoma to the back of her head and bruising to back of left leg. EMS gave 156mcg Fentanyl en route. Pt does take Eliquis.  EMS vitals: 164/94, 70HR, 134CBG,    Allergies Allergies  Allergen Reactions   Penicillins Hives and Swelling    Has patient had a PCN reaction causing immediate rash, facial/tongue/throat swelling, SOB or lightheadedness with hypotension: Yes Has patient had a PCN reaction causing severe rash involving mucus membranes or skin necrosis: No Has patient had a PCN reaction that required hospitalization: No Has patient had a PCN reaction occurring within the last 10 years: No If all of the above answers are "NO", then may proceed with Cephalosporin use.    Brimonidine Other (See Comments)    Don't remember   Other     Nuts causes blisters No spices coffee   Penicillin G Other (See Comments)   Timolol Maleate Other (See Comments)    Pt becomes faint   Sulfa Antibiotics Rash   Sulfasalazine Rash    Level of Care/Admitting Diagnosis ED Disposition     ED Disposition  Admit   Condition  --   [place]: [place] [100100]  Level of Care: Telemetry Medical [104]  May place patient in observation at [facility] or [place] if equivalent level of care is available:: No  Covid Evaluation:  Asymptomatic Screening Protocol (No Symptoms)  Diagnosis: Syncope [206001]  Admitting Physician: Lequita Halt [id number]  Attending Physician: Lequita Halt [id number]          B Medical/Surgery History Past Medical History:  Diagnosis Date   Atrial fibrillation ([place])    Hypertension    Immune system disorder ([place])    Polymyalgia ([place])    Past Surgical History:  Procedure Laterality Date   ABDOMINAL HYSTERECTOMY     BLADDER SURGERY     OOPHORECTOMY       A IV Location/Drains/Wounds Patient Lines/Drains/Airways Status     Active Line/Drains/Airways     None            Intake/Output Last 24 hours No intake or output data in the 24 hours ending 08/04/21 1625  Labs/Imaging Results for orders placed or performed during the hospital encounter of 08/03/21 (from the past 48 hour(s))  Resp Panel by RT-PCR (Flu A&B, Covid) Nasopharyngeal Swab     Status: None   Collection Time: 08/03/21  2:42 PM   Specimen: Nasopharyngeal Swab; Nasopharyngeal(NP) swabs in vial transport medium  Result Value Ref Range   SARS Coronavirus 2 by RT PCR NEGATIVE NEGATIVE    Comment: (NOTE) SARS-CoV-2 target nucleic acids are NOT DETECTED.  The SARS-CoV-2 RNA is generally detectable in upper respiratory specimens during the acute phase of infection. The lowest concentration of SARS-CoV-2 viral copies this assay can detect is 138 copies/mL. A negative result does  not preclude SARS-Cov-2 infection and should not be used as the sole basis for treatment or other patient management decisions. A negative result may occur with  improper specimen collection/handling, submission of specimen other than nasopharyngeal swab, presence of viral mutation(s) within the areas targeted by this assay, and inadequate number of viral copies(<138 copies/mL). A negative result must be combined with clinical observations, patient history, and epidemiological information. The expected result is Negative.  Fact  Sheet for Patients:  BloggerCourse.comhttps://www.fda.gov/media/152166/download  Fact Sheet for Healthcare Providers:  SeriousBroker.ithttps://www.fda.gov/media/152162/download  This test is no t yet approved or cleared by the [place] FDA and  has been authorized for detection and/or diagnosis of SARS-CoV-2 by FDA under an Emergency Use Authorization (EUA). This EUA will remain  in effect (meaning this test can be used) for the duration of the COVID-19 declaration under Section 564(b)(1) of the Act, 21 U.S.C.section 360bbb-3(b)(1), unless the authorization is terminated  or revoked sooner.       Influenza A by PCR NEGATIVE NEGATIVE   Influenza B by PCR NEGATIVE NEGATIVE    Comment: (NOTE) The Xpert Xpress SARS-CoV-2/FLU/RSV plus assay is intended as an aid in the diagnosis of influenza from Nasopharyngeal swab specimens and should not be used as a sole basis for treatment. Nasal washings and aspirates are unacceptable for Xpert Xpress SARS-CoV-2/FLU/RSV testing.  Fact Sheet for Patients: BloggerCourse.comhttps://www.fda.gov/media/152166/download  Fact Sheet for Healthcare Providers: SeriousBroker.ithttps://www.fda.gov/media/152162/download  This test is not yet approved or cleared by the [place] FDA and has been authorized for detection and/or diagnosis of SARS-CoV-2 by FDA under an Emergency Use Authorization (EUA). This EUA will remain in effect (meaning this test can be used) for the duration of the COVID-19 declaration under Section 564(b)(1) of the Act, 21 U.S.C. section 360bbb-3(b)(1), unless the authorization is terminated or revoked.  Performed at [facility], [address] [place], [place] [address]   Comprehensive metabolic panel     Status: Abnormal   Collection Time: 08/03/21  2:42 PM  Result Value Ref Range   Sodium 141 135 - 145 mmol/L   Potassium 4.1 3.5 - 5.1 mmol/L   Chloride 105 98 - 111 mmol/L   CO2 26 22 - 32 mmol/L   Glucose, Bld 116 (H) 70 - 99 mg/dL    Comment: Glucose reference range  applies only to samples taken after fasting for at least 8 hours.   BUN 24 (H) 8 - 23 mg/dL   Creatinine, Ser 1.911.41 (H) 0.44 - 1.00 mg/dL   Calcium 9.0 8.9 - 47.810.3 mg/dL   Total Protein 6.5 6.5 - 8.1 g/dL   Albumin 3.8 3.5 - 5.0 g/dL   AST 52 (H) 15 - 41 U/L   ALT 32 0 - 44 U/L   Alkaline Phosphatase 65 38 - 126 U/L   Total Bilirubin 1.0 0.3 - 1.2 mg/dL   GFR, Estimated 36 (L) >60 mL/min    Comment: (NOTE) Calculated using the CKD-EPI Creatinine Equation (2021)    Anion gap 10 5 - 15    Comment: Performed at [facility], [address] [place], [place] [address]  CBC     Status: Abnormal   Collection Time: 08/03/21  2:42 PM  Result Value Ref Range   WBC 13.3 (H) 4.0 - 10.5 K/uL   RBC 3.75 (L) 3.87 - 5.11 MIL/uL   Hemoglobin 12.2 12.0 - 15.0 g/dL   HCT 13.038.4 86.536.0 - 78.446.0 %   MCV 102.4 (H) 80.0 - 100.0 fL  MCH 32.5 26.0 - 34.0 pg   MCHC 31.8 30.0 - 36.0 g/dL   RDW 14.2 11.5 - 15.5 %   Platelets 187 150 - 400 K/uL   nRBC 0.0 0.0 - 0.2 %    Comment: Performed at [facility], [place] [address], [place], [place] [address]  Ethanol     Status: None   Collection Time: 08/03/21  2:42 PM  Result Value Ref Range   Alcohol, Ethyl (B) <10 <10 mg/dL    Comment: (NOTE) Lowest detectable limit for serum alcohol is 10 mg/dL.  For medical purposes only. Performed at [facility], [place] [address], [place], [place] [address]   Urinalysis, Routine w reflex microscopic Urine, Clean Catch     Status: None   Collection Time: 08/03/21  2:42 PM  Result Value Ref Range   Color, Urine YELLOW YELLOW   APPearance CLEAR CLEAR   Specific Gravity, Urine 1.015 1.005 - 1.030   pH 7.0 5.0 - 8.0   Glucose, UA NEGATIVE NEGATIVE mg/dL   Hgb urine dipstick NEGATIVE NEGATIVE   Bilirubin Urine NEGATIVE NEGATIVE   Ketones, ur NEGATIVE NEGATIVE mg/dL   Protein, ur NEGATIVE NEGATIVE mg/dL   Nitrite NEGATIVE NEGATIVE   Leukocytes,Ua NEGATIVE NEGATIVE    Comment: Microscopic not done on urines  with negative protein, blood, leukocytes, nitrite, or glucose < 500 mg/dL. Performed at [facility], [place] [address] [place], [place] [address]   Protime-INR     Status: None   Collection Time: 08/03/21  2:42 PM  Result Value Ref Range   Prothrombin Time 15.1 11.4 - 15.2 seconds   INR 1.2 0.8 - 1.2    Comment: (NOTE) INR goal varies based on device and disease states. Performed at [facility], [place] [address] [place], [place] [address]   Troponin I (High Sensitivity)     Status: Abnormal   Collection Time: 08/03/21  2:55 PM  Result Value Ref Range   Troponin I (High Sensitivity) 21 (H) <18 ng/L    Comment: (NOTE) Elevated high sensitivity troponin I (hsTnI) values and significant  changes across serial measurements may suggest ACS but many other  chronic and acute conditions are known to elevate hsTnI results.  Refer to the "Links" section for chest pain algorithms and additional  guidance. Performed at [facility], [place] [address], [place], [place] [address]   I-Stat Chem 8, ED     Status: Abnormal   Collection Time: 08/03/21  3:06 PM  Result Value Ref Range   Sodium 140 135 - 145 mmol/L   Potassium 3.7 3.5 - 5.1 mmol/L   Chloride 105 98 - 111 mmol/L   BUN 27 (H) 8 - 23 mg/dL   Creatinine, Ser 1.50 (H) 0.44 - 1.00 mg/dL   Glucose, Bld 110 (H) 70 - 99 mg/dL    Comment: Glucose reference range applies only to samples taken after fasting for at least 8 hours.   Calcium, Ion 1.06 (L) 1.15 - 1.40 mmol/L   TCO2 25 22 - 32 mmol/L   Hemoglobin 12.6 12.0 - 15.0 g/dL   HCT 37.0 36.0 - 46.0 %  Lactic acid, plasma     Status: None   Collection Time: 08/03/21  3:14 PM  Result Value Ref Range   Lactic Acid, Venous 1.5 0.5 - 1.9 mmol/L    Comment: Performed at [place] [address], [place],  [address]  Sample to Blood Bank  Status: None   Collection Time: 08/03/21  3:14 PM  Result Value Ref Range   Blood Bank Specimen SAMPLE AVAILABLE FOR  TESTING    Sample Expiration      08/04/2021,2359 Performed at [facility], [address], [place], [place] [address]   Troponin I (High Sensitivity)     Status: Abnormal   Collection Time: 08/03/21  5:58 PM  Result Value Ref Range   Troponin I (High Sensitivity) 24 (H) <18 ng/L    Comment: (NOTE) Elevated high sensitivity troponin I (hsTnI) values and significant  changes across serial measurements may suggest ACS but many other  chronic and acute conditions are known to elevate hsTnI results.  Refer to the "Links" section for chest pain algorithms and additional  guidance. Performed at [facility], [place] [address] [place], [place] [address]   CBC     Status: Abnormal   Collection Time: 08/04/21  4:43 AM  Result Value Ref Range   WBC 6.8 4.0 - 10.5 K/uL   RBC 3.38 (L) 3.87 - 5.11 MIL/uL   Hemoglobin 10.8 (L) 12.0 - 15.0 g/dL   HCT 35.2 (L) 36.0 - 46.0 %   MCV 104.1 (H) 80.0 - 100.0 fL   MCH 32.0 26.0 - 34.0 pg   MCHC 30.7 30.0 - 36.0 g/dL   RDW 14.4 11.5 - 15.5 %   Platelets 156 150 - 400 K/uL   nRBC 0.0 0.0 - 0.2 %    Comment: Performed at [facility], [place] [address] [place], [place] [id number]  Basic metabolic panel     Status: Abnormal   Collection Time: 08/04/21  4:43 AM  Result Value Ref Range   Sodium 140 135 - 145 mmol/L   Potassium 3.4 (L) 3.5 - 5.1 mmol/L   Chloride 106 98 - 111 mmol/L   CO2 25 22 - 32 mmol/L   Glucose, Bld 82 70 - 99 mg/dL    Comment: Glucose reference range applies only to samples taken after fasting for at least 8 hours.   BUN 19 8 - 23 mg/dL   Creatinine, Ser 1.22 (H) 0.44 - 1.00 mg/dL   Calcium 8.4 (L) 8.9 - 10.3 mg/dL   GFR, Estimated 43 (L) >60 mL/min    Comment: (NOTE) Calculated using the CKD-EPI Creatinine Equation (2021)    Anion gap 9 5 - 15    Comment: Performed at [place] [address] [place],  [address]   CT HEAD WO CONTRAST  Result Date: 08/03/2021 CLINICAL DATA:  Syncopal  episode with fall. EXAM: CT HEAD WITHOUT CONTRAST TECHNIQUE: Contiguous axial images were obtained from the base of the skull through the vertex without intravenous contrast. COMPARISON:  06/26/2021 FINDINGS: Brain: Generalized atrophy. Chronic small-vessel ischemic changes of the white matter. No sign of acute infarction, mass lesion, hemorrhage, hydrocephalus or extra-axial collection. Vascular: There is atherosclerotic calcification of the major vessels at the base of the brain. Skull: No skull fracture. Sinuses/Orbits: Clear/normal Other: Left parietal scalp hematoma. IMPRESSION: No acute intracranial injury. Left parietal scalp hematoma. No underlying skull fracture. Electronically Signed   By: Nelson Chimes M.D.   On: 08/03/2021 16:37   CT CERVICAL SPINE WO CONTRAST  Result Date: 08/03/2021 CLINICAL DATA:  Syncopal episode with fall and trauma to the head and neck. EXAM: CT CERVICAL SPINE WITHOUT CONTRAST TECHNIQUE: Multidetector CT imaging of the cervical spine was performed without intravenous contrast. Multiplanar CT image reconstructions were also generated. COMPARISON:  12/06/2020 FINDINGS: Alignment: Scoliotic curvature convex to the left. Skull base and vertebrae: Compression fracture at T1 with loss of height of about 10%. Compression fracture of T3 with loss of height anteriorly of 80% and posterior bowing of the posterior margin of the vertebral body. Fracture of the right facet at T3. Soft tissues and spinal canal: No soft tissue injury seen. Disc levels: Chronic degenerative spondylosis at C5-6. No compressive stenosis visible. Upper chest: See results of chest CT. Other: None IMPRESSION: Cervicothoracic curvature convex to the left. No fracture seen in the cervical region. Compression fracture at T1 with loss of height of about 10%. Compression fracture at T3 with loss of height anteriorly of 80%. Posterior bowing of the posterior margin of the vertebral body. Fracture of the T3 right facet.  Electronically Signed   By: Paulina Fusi M.D.   On: 08/03/2021 16:40   CT CHEST ABDOMEN PELVIS W CONTRAST  Result Date: 08/03/2021 CLINICAL DATA:  Recent syncopal episode with pain, initial encounter EXAM: CT CHEST, ABDOMEN, AND PELVIS WITH CONTRAST TECHNIQUE: Multidetector CT imaging of the chest, abdomen and pelvis was performed following the standard protocol during bolus administration of intravenous contrast. CONTRAST:  81mL OMNIPAQUE IOHEXOL 300 MG/ML  SOLN COMPARISON:  12/06/2020 FINDINGS: CT CHEST FINDINGS Cardiovascular: Atherosclerotic calcifications of the thoracic aorta are noted without aneurysmal dilatation or dissection. No cardiac enlargement is noted. Mild coronary calcifications are seen. No large central pulmonary embolus is noted although timing was not performed for embolus evaluation. Mediastinum/Nodes: Thoracic inlet is within normal limits. No sizable hilar or mediastinal adenopathy noted. The esophagus as visualized is within normal limits. Lungs/Pleura: Few scattered calcified granulomas are seen. The lungs are well aerated. Chronic scarring with calcification is noted in the upper lobes bilaterally stable from previous exam from 12/06/2020. Musculoskeletal: Degenerative changes of the thoracic spine are seen. No acute rib fracture is noted. Chronic appearing T3 compression deformity is noted which is new from the prior exam. Old healed sternal fracture is noted. CT ABDOMEN PELVIS FINDINGS Hepatobiliary: No focal liver abnormality is seen. No gallstones, gallbladder wall thickening, or biliary dilatation. Pancreas: Unremarkable. No pancreatic ductal dilatation or surrounding inflammatory changes. Spleen: Normal in size without focal abnormality. Adrenals/Urinary Tract: Adrenal glands are within normal limits bilaterally. Kidneys demonstrate cystic changes bilaterally. No renal calculi or obstructive changes are noted. Delayed images demonstrate normal excretion of contrast material.  Bladder is well distended and within normal limits. Stomach/Bowel: The appendix is not well visualized although no inflammatory changes are identified to suggest appendicitis. Scattered fecal material is noted throughout the colon. Small bowel and stomach are within normal limits. Vascular/Lymphatic: Aortic atherosclerosis. No enlarged abdominal or pelvic lymph nodes. Reproductive: Status post hysterectomy. No adnexal masses. Other: No abdominal wall hernia or abnormality. No abdominopelvic ascites. Musculoskeletal: Degenerative changes of lumbar spine are noted. L1 compression deformity is noted which is new from prior examination from April of 2022 but stable from prior plain film from 06/16/2021. IMPRESSION: CT of the chest: Changes of prior granulomatous disease as well as calcified and noncalcified areas of scarring in the upper lobes bilaterally. T3 compression deformity new from prior exam in April of 2022 but of a chronic appearance. CT of the abdomen and pelvis: L1 compression deformity which is stable from prior plain film examination in November of 2022. No acute abnormality is noted. Electronically Signed   By: Alcide Clever M.D.   On: 08/03/2021 16:44    Pending Labs Unresulted Labs (From admission, onward)  None       Vitals/Pain Today's Vitals   08/04/21 1200 08/04/21 1400 08/04/21 1611 08/04/21 1615  BP: 136/77 (!) 150/67 139/74 (!) 143/66  Pulse: 73 74 61 (!) 57  Resp: 15 (!) 26 14 (!) 25  Temp:      TempSrc:      SpO2: 99% 98% 99% 98%  Weight:      Height:      PainSc:        Isolation Precautions No active isolations  Medications Medications  traMADol-acetaminophen (ULTRACET) 37.5-325 MG per tablet 1 tablet (1 tablet Oral Given 08/04/21 1204)  hydroxychloroquine (PLAQUENIL) tablet 200 mg (200 mg Oral Given 08/04/21 0952)  amiodarone (PACERONE) tablet 200 mg (200 mg Oral Given 08/04/21 0944)  diltiazem (CARDIZEM CD) 24 hr capsule 120 mg (120 mg Oral Given 08/04/21  0953)  lisinopril (ZESTRIL) tablet 10 mg (10 mg Oral Given 08/04/21 0944)  ALPRAZolam (XANAX) tablet 0.25 mg (0.25 mg Oral Given 08/04/21 0943)  predniSONE (DELTASONE) tablet 5 mg (5 mg Oral Given 08/04/21 0946)  pantoprazole (PROTONIX) EC tablet 40 mg (40 mg Oral Given 08/04/21 0942)  senna-docusate (Senokot-S) tablet 1 tablet (has no administration in time range)  apixaban (ELIQUIS) tablet 2.5 mg (2.5 mg Oral Given 08/04/21 0946)  ferrous sulfate tablet 325 mg (325 mg Oral Given 08/04/21 0942)  brinzolamide (AZOPT) 1 % ophthalmic suspension 1 drop (1 drop Both Eyes Not Given 08/04/21 1503)  cycloSPORINE (RESTASIS) 0.05 % ophthalmic emulsion 1 drop (1 drop Both Eyes Given 08/04/21 0948)  Netarsudil-Latanoprost 0.02-0.005 % SOLN 1 drop (1 drop Both Eyes Not Given 08/04/21 0745)  acetaminophen (TYLENOL) tablet 650 mg (has no administration in time range)    Or  acetaminophen (TYLENOL) suppository 650 mg (has no administration in time range)  ondansetron (ZOFRAN) tablet 4 mg (has no administration in time range)    Or  ondansetron (ZOFRAN) injection 4 mg (has no administration in time range)  0.9 %  sodium chloride infusion ( Intravenous Rate/Dose Verify 08/04/21 0737)  hydrALAZINE (APRESOLINE) tablet 25 mg (25 mg Oral Given 08/04/21 0710)  calcitonin (salmon) (MIACALCIN/FORTICAL) nasal spray 1 spray (1 spray Alternating Nares Given 08/04/21 0950)  polyethylene glycol (MIRALAX / GLYCOLAX) packet 34 g (has no administration in time range)  haloperidol lactate (HALDOL) injection 1 mg (1 mg Intravenous Given 08/04/21 1407)  QUEtiapine (SEROQUEL) tablet 12.5 mg (has no administration in time range)  lactated ringers bolus 500 mL (0 mLs Intravenous Stopped 08/03/21 1754)  iohexol (OMNIPAQUE) 300 MG/ML solution 60 mL (60 mLs Intravenous Contrast Given 08/03/21 1631)  acetaminophen (TYLENOL) tablet 650 mg (650 mg Oral Given 08/03/21 1652)  traMADol (ULTRAM) tablet 50 mg (50 mg Oral Given 08/03/21  1653)  fentaNYL (SUBLIMAZE) injection 50 mcg (50 mcg Intravenous Given 08/03/21 1754)  potassium chloride SA (KLOR-CON M) CR tablet 40 mEq (40 mEq Oral Given 08/04/21 [id number])    Mobility walks High fall risk   Focused Assessments Cardiac Assessment Handoff:  Cardiac Rhythm: Normal sinus rhythm No results found for: CKTOTAL, CKMB, CKMBINDEX, TROPONINI Lab Results  Component Value Date   DDIMER <0.27 01/09/2020   Does the Patient currently have chest pain? No    R Recommendations: See Admitting Provider Note  Report given to:   Additional Notes: I assumed care of this pt 1500 today, per RN before me, belief is that pt is experiencing hospital induced delirium. Pt received 1 mg haldol at 1407 today and slept until about 1600.

## 2021-08-04 NOTE — ED Notes (Signed)
Patient found up out of bed, standing at foot of bed. Pt pulled PIV out and monitoring equipment off. This RN and tech assisted patient back into bed back onto monitoring equipment.

## 2021-08-04 NOTE — Evaluation (Signed)
Occupational Therapy Evaluation Patient Details Name: Jodi Snyder MRN: [id number] DOB: 09-06-1931 Today's Date: 08/04/2021   History of Present Illness 87 y.o. female who presented 12/19 with syncope and fall, noted previous L1 fracture and T3 fracture on imaging.  Pt is poor historian, has aggravation of back pain and frequent fall history.  Had negative head CT and is awaiting a TLSO.   PMH: atrial fibrillation, hypertension, discoid lupus, polymyalgia rheumatica, L1 fracture   Clinical Impression   Pt presents with pain and decreased balance, strength, and activity tolerance. Currently requiring Mod A for UB ADLs, Max A for LB ADLs, Total A for toileting, and Min A for functional transfers. Pt was notably tearful and weeping throughout session, upset with her current condition. Pt lives in ILF, but reports she plans to move to ALF in a few weeks. Would likely benefit from SNF placement for further rehab prior to return home. Will follow acutely.      Recommendations for follow up therapy are one component of a multi-disciplinary discharge planning process, led by the attending physician.  Recommendations may be updated based on patient status, additional functional criteria and insurance authorization.   Follow Up Recommendations  Skilled nursing-short term rehab (<3 hours/day)    Assistance Recommended at Discharge Frequent or constant Supervision/Assistance  Functional Status Assessment  Patient has had a recent decline in their functional status and demonstrates the ability to make significant improvements in function in a reasonable and predictable amount of time.  Equipment Recommendations  Other (comment) (Defer)    Recommendations for Other Services       Precautions / Restrictions Precautions Precautions: Back;Fall Required Braces or Orthoses: Spinal Brace Spinal Brace: Thoracolumbosacral orthotic Restrictions Weight Bearing Restrictions: No      Mobility Bed  Mobility Overal bed mobility: Needs Assistance Bed Mobility: Rolling;Sidelying to Sit;Sit to Sidelying Rolling: Min assist Sidelying to sit: Min assist     Sit to sidelying: Min assist      Transfers Overall transfer level: Needs assistance Equipment used: Rolling walker (2 wheels) Transfers: Sit to/from Stand Sit to Stand: Min assist                  Balance Overall balance assessment: Needs assistance Sitting-balance support: Feet supported Sitting balance-Leahy Scale: Fair     Standing balance support: Bilateral upper extremity supported Standing balance-Leahy Scale: Poor                             ADL either performed or assessed with clinical judgement   ADL Overall ADL's : Needs assistance/impaired Eating/Feeding: Independent   Grooming: Minimal assistance;Sitting   Upper Body Bathing: Moderate assistance;Sitting   Lower Body Bathing: Maximal assistance;Sitting/lateral leans   Upper Body Dressing : Moderate assistance;Sitting   Lower Body Dressing: Maximal assistance;Sitting/lateral leans   Toilet Transfer: Minimal assistance;Stand-pivot;BSC/3in1;Rolling walker (2 wheels)   Toileting- Clothing Manipulation and Hygiene: Total assistance;Sit to/from stand       Functional mobility during ADLs: Minimal assistance;Rolling walker (2 wheels)       Vision Patient Visual Report: No change from baseline       Perception     Praxis      Pertinent Vitals/Pain Pain Assessment: 0-10 Pain Score: 8  Pain Location: upper back Pain Descriptors / Indicators: Aching;Grimacing;Guarding Pain Intervention(s): Limited activity within patient's tolerance;Monitored during session;Repositioned     Hand Dominance Right   Extremity/Trunk Assessment Upper Extremity Assessment Upper Extremity Assessment: Generalized weakness  Lower Extremity Assessment Lower Extremity Assessment: Defer to PT evaluation   Cervical / Trunk Assessment Cervical /  Trunk Assessment: Kyphotic   Communication Communication Communication: No difficulties   Cognition Arousal/Alertness: Awake/alert Behavior During Therapy: Flat affect;Anxious Overall Cognitive Status: Within Functional Limits for tasks assessed                                 General Comments: Pt A&Ox3. Tearful and weeping throughout eval.     General Comments       Exercises     Shoulder Instructions      Home Living Family/patient expects to be discharged to:: Other (Comment) (ILF)                                 Additional Comments: Pt in ILF with no stairs to negotiate and with walk-in shower with handicap-height toilet. Standard bed. PRN assistance available from neighbor and step-son. Has rollator, shower chair, grab-bars in shower.      Prior Functioning/Environment Prior Level of Function : Independent/Modified Independent;History of Falls (last six months)             Mobility Comments: Rollator for mobility ADLs Comments: Assistance with IADLs.        OT Problem List: Decreased strength;Decreased activity tolerance;Impaired balance (sitting and/or standing);Decreased knowledge of use of DME or AE;Decreased knowledge of precautions;Pain      OT Treatment/Interventions: Self-care/ADL training;Therapeutic exercise;Energy conservation;DME and/or AE instruction;Therapeutic activities;Patient/family education;Balance training    OT Goals(Current goals can be found in the care plan section) Acute Rehab OT Goals Patient Stated Goal: reduce pain OT Goal Formulation: With patient Time For Goal Achievement: 08/18/21 Potential to Achieve Goals: Good  OT Frequency: Min 2X/week   Barriers to D/C:            Co-evaluation              AM-PAC OT "6 Clicks" Daily Activity     Outcome Measure Help from another person eating meals?: None Help from another person taking care of personal grooming?: A Little Help from another  person toileting, which includes using toliet, bedpan, or urinal?: Total Help from another person bathing (including washing, rinsing, drying)?: A Lot Help from another person to put on and taking off regular upper body clothing?: A Lot Help from another person to put on and taking off regular lower body clothing?: A Lot 6 Click Score: 14   End of Session Equipment Utilized During Treatment: Rolling walker (2 wheels);Back brace Nurse Communication: Mobility status  Activity Tolerance: Patient limited by pain Patient left: in bed;with call bell/phone within reach  OT Visit Diagnosis: Unsteadiness on feet (R26.81);Repeated falls (R29.6);Muscle weakness (generalized) (M62.81);Pain                Time: 8469-6295 OT Time Calculation (min): 33 min Charges:  OT General Charges $OT Visit: 1 Visit OT Evaluation $OT Eval Moderate Complexity: 1 Mod OT Treatments $Therapeutic Activity: 8-22 mins  Kristopher Delk C, OT/L  Acute Rehab [phone]  Ines Bloomer A Paitynn Mikus 08/04/2021, 11:00 AM

## 2021-08-04 NOTE — ED Notes (Signed)
Family member now gone, pt continues to attempt to get OOB. Attempted to re redirect pt who stated that she needed toothpaste and a toothbrush which was at the bedside, but pt did not wish to use. Continued to state other needs such as "they took my things"  "they are trying to hurt me". Pt minimally redirectable

## 2021-08-04 NOTE — ED Notes (Signed)
Son at bedside at this time,pt does appear to be more calm and cooperative

## 2021-08-04 NOTE — Progress Notes (Signed)
Pt refused ice packs

## 2021-08-04 NOTE — ED Notes (Signed)
Pt becoming increasingly agitated and confused. Insists that "everyone is out to get her" and that no one is taking care of her. Pt c/o being cold. Pt repositioned and several warm blankets applied. Pt then removed all the warm blankets and stated she was cold again. Pt appears to be growing more confused and resistant to care or comfort measures. AM xanax admin by previous nurse w/out change.

## 2021-08-04 NOTE — Progress Notes (Signed)
Orthopedic Tech Progress Note Patient Details:  Jodi Snyder 1932/06/07 [id number]  RN stated patient has back brace at home  Patient ID: Jodi Snyder, female   DOB: 04-Jul-1932, 87 y.o.   MRN: [id number]  [name] 08/04/2021, 10:16 AM

## 2021-08-04 NOTE — ED Notes (Addendum)
Complete linen change performed, clean brief and purewick applied to pt by this RN. Pt transported to Room 09 for close monitoring by this RN d/t pt noted confusion after IV line noted to be removed by pt. Pt oriented to room and call light placed within reach.

## 2021-08-04 NOTE — ED Notes (Signed)
This RN attempted x3 to call report to facility with no answer. PTAR notified of the same.

## 2021-08-04 NOTE — Evaluation (Signed)
Physical Therapy Evaluation Patient Details Name: Jodi Snyder MRN: [id number] DOB: October 01, 1931 Today's Date: 08/04/2021  History of Present Illness  87 y.o. female who presented 12/19 with syncope and fall, noted previous L1 fracture and T3 fracture on imaging.  Pt is poor historian, has aggravation of back pain and frequent fall history.  Had negative head CT and is awaiting a TLSO.   PMH: atrial fibrillation, hypertension, discoid lupus, polymyalgia rheumatica, L1 fracture  Clinical Impression  Pt was seen for evaluation with limited progression due to pain and declination of pt of TLSO support.   Her plan is to seek rehab placement for safety and recovery of her mobility.  Pt reports being in a semi-supported environment with son being available at times.  Her plan is to progress with gait and transfers as tolerated, and to give her safety and support of spine issues with wearing of brace.  Her current orders do not allow for her to be mobilized extensively without the brace, and so will continue to encourage and move as her tolerance and willingness to wear the brace permit.     Recommendations for follow up therapy are one component of a multi-disciplinary discharge planning process, led by the attending physician.  Recommendations may be updated based on patient status, additional functional criteria and insurance authorization.  Follow Up Recommendations Skilled nursing-short term rehab (<3 hours/day)    Assistance Recommended at Discharge Frequent or constant Supervision/Assistance  Functional Status Assessment Patient has had a recent decline in their functional status and demonstrates the ability to make significant improvements in function in a reasonable and predictable amount of time.  Equipment Recommendations  Rolling walker (2 wheels)    Recommendations for Other Services       Precautions / Restrictions Precautions Precautions: Fall;Back Required Braces or Orthoses:  Spinal Brace Spinal Brace: Other (comment) (clamshell type brace) Restrictions Weight Bearing Restrictions: No      Mobility  Bed Mobility Overal bed mobility: Needs Assistance Bed Mobility: Rolling;Sidelying to Sit;Sit to Sidelying Rolling: Mod assist Sidelying to sit: Mod assist     Sit to sidelying: Mod assist      Transfers Overall transfer level: Needs assistance Equipment used: 1 person hand held assist Transfers: Sit to/from Stand Sit to Stand: Min assist                Ambulation/Gait Ambulation/Gait assistance: Min guard Gait Distance (Feet): 3 Feet Assistive device: 1 person hand held assist Gait Pattern/deviations: Step-to pattern;Narrow base of support;Decreased stride length Gait velocity: reduced Gait velocity interpretation: <1.31 ft/sec, indicative of household ambulator Pre-gait activities: standing balance control and sidesteps General Gait Details: sidesteps with PT since pt would not allow donning of clamshell  Stairs            Wheelchair Mobility    Modified Rankin (Stroke Patients Only)       Balance Overall balance assessment: Needs assistance Sitting-balance support: Feet supported Sitting balance-Leahy Scale: Fair     Standing balance support: Bilateral upper extremity supported;During functional activity Standing balance-Leahy Scale: Poor Standing balance comment: pt refused to don brace                             Pertinent Vitals/Pain Pain Assessment: 0-10 Pain Score: 10-Worst pain ever Pain Location: shoulders Pain Descriptors / Indicators: Aching;Grimacing;Guarding Pain Intervention(s): Limited activity within patient's tolerance;Monitored during session;Repositioned    Home Living Family/patient expects to be discharged to:: Other (Comment)  Additional Comments: Pt in ILF with no stairs to negotiate and with walk-in shower with handicap-height toilet. Standard bed. PRN  assistance available from neighbor and step-son. Has rollator, shower chair, grab-bars in shower.    Prior Function Prior Level of Function : Independent/Modified Independent;History of Falls (last six months)             Mobility Comments: Rollator for mobility ADLs Comments: Assistance with IADLs.     Hand Dominance   Dominant Hand: Right    Extremity/Trunk Assessment   Upper Extremity Assessment Upper Extremity Assessment: Defer to OT evaluation    Lower Extremity Assessment Lower Extremity Assessment: Generalized weakness    Cervical / Trunk Assessment Cervical / Trunk Assessment: Kyphotic  Communication   Communication: No difficulties  Cognition Arousal/Alertness: Awake/alert Behavior During Therapy: Anxious;Agitated;Flat affect Overall Cognitive Status: No family/caregiver present to determine baseline cognitive functioning                                 General Comments: Pt is talking about a fall in the tub, relies on her son to help who is not there all the time        General Comments General comments (skin integrity, edema, etc.): Pt was assisted to get up and asked to don clamshell for safety and comfort of spine.  Declines to wear, which hindered her progression to gait on the walker.    Exercises     Assessment/Plan    PT Assessment Patient needs continued PT services  PT Problem List Decreased strength;Decreased activity tolerance;Decreased balance;Decreased mobility;Decreased coordination;Decreased cognition;Decreased safety awareness;Pain       PT Treatment Interventions DME instruction;Gait training;Functional mobility training;Therapeutic activities;Therapeutic exercise;Balance training;Neuromuscular re-education;Patient/family education    PT Goals (Current goals can be found in the Care Plan section)  Acute Rehab PT Goals Patient Stated Goal: strengthen to get home, get pain managed PT Goal Formulation: With patient Time  For Goal Achievement: 08/18/21 Potential to Achieve Goals: Good    Frequency Min 3X/week (in case pt is going back to her home)   Barriers to discharge Decreased caregiver support home with minimal help and inability to walk    Co-evaluation               AM-PAC PT "6 Clicks" Mobility  Outcome Measure Help needed turning from your back to your side while in a flat bed without using bedrails?: A Lot Help needed moving from lying on your back to sitting on the side of a flat bed without using bedrails?: A Lot Help needed moving to and from a bed to a chair (including a wheelchair)?: A Little Help needed standing up from a chair using your arms (e.g., wheelchair or bedside chair)?: A Little Help needed to walk in hospital room?: A Little Help needed climbing 3-5 steps with a railing? : A Lot 6 Click Score: 15    End of Session Equipment Utilized During Treatment: Gait belt;Other (comment) (clamshell but did not get on the patient) Activity Tolerance: Patient limited by fatigue;Patient limited by pain Patient left: in bed;with call bell/phone within reach Nurse Communication: Mobility status;Other (comment) (discussion of limits of tx with pt's cognition and pain as well as refusal of brace) PT Visit Diagnosis: Unsteadiness on feet (R26.81);Muscle weakness (generalized) (M62.81);Pain Pain - Right/Left:  (back) Pain - part of body:  (back)    Time: 8338-2505 PT Time Calculation (min) (ACUTE ONLY): 25 min  Charges:   PT Evaluation $PT Eval Moderate Complexity: 1 Mod PT Treatments $Therapeutic Activity: 8-22 mins       Ramond Dial 08/04/2021, 12:29 PM  Mee Hives, PT PhD Acute Rehab Dept. Number: [facility]

## 2021-08-04 NOTE — TOC Initial Note (Signed)
Transition of Care [facility]) - Initial/Assessment Note    Patient Details  Name: Jodi Snyder MRN: [id number] Date of Birth: August 18, 1931  Transition of [facility]) CM/SW Contact:    Lockie Pares, RN Phone Number: 08/04/2021, 1:42 PM  Clinical Narrative:                  87 yo patient from Independent Living. Has a history of  previous spinal fx. And frequent fall history. , atrial fibrillation, lupus PMR. Presented with fall on blood thinners. Has compression fracture of T1and T3 awaiting TLSO. PT and OT evaluation reveal recommendation for SNF. The patient was already making arrangement for ALF prior to this incident.  TOC will follow for needs, recommendations, and transitions.  Expected Discharge Plan: Skilled Nursing Facility Barriers to Discharge: Continued Medical Work up   Patient Goals and CMS Choice        Expected Discharge Plan and Services Expected Discharge Plan: Skilled Nursing Facility In-house Referral: Clinical Social Work Discharge Planning Services: CM Consult   Living arrangements for the past 2 months: Independent Living Facility                                      Prior Living Arrangements/Services Living arrangements for the past 2 months: Independent Living Facility Lives with:: Self Patient language and need for interpreter reviewed:: Yes        Need for Family Participation in Patient Care: Yes (Comment) Care giver support system in place?: Yes (comment)   Criminal Activity/Legal Involvement Pertinent to Current Situation/Hospitalization: No - Comment as needed  Activities of Daily Living      Permission Sought/Granted                  Emotional Assessment     Affect (typically observed): Tearful/Crying Orientation: : Oriented to Self Alcohol / Substance Use: Not Applicable Psych Involvement: No (comment)  Admission diagnosis:  Syncope [R55] Patient Active Problem List   Diagnosis Date Noted   Syncope, vasovagal  08/03/2021   Syncope 08/03/2021   Fall    Closed compression fracture of body of L1 vertebra (HCC) 06/26/2021   Acute midline low back pain without sciatica    Acute encephalopathy 06/25/2021   Acute weakness 01/11/2020   Chest pain 01/09/2020   Acute left-sided thoracic back pain    Supraventricular tachycardia (HCC) 09/29/2019   Hypokalemia    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HCC) 08/19/2018   Asymptomatic microscopic hematuria 01/05/2018   Paroxysmal atrial fibrillation (HCC) 01/05/2018   Scoliosis 10/18/2017   Thoracogenic scoliosis of thoracolumbar region 10/08/2017   Glaucoma 06/21/2016   Drug therapy 01/28/2016   Benign hypertension with CKD (chronic kidney disease) stage III 01/16/2016   Discoid lupus erythematosus 01/16/2016   Generalized anxiety disorder 01/16/2016   Osteoporosis 01/16/2016   Polymyalgia rheumatica (HCC) 01/16/2016   Closed fracture of distal end of left radius 11/11/2014   Dry eye syndrome 08/03/2012   Other states following surgery of eye and adnexa 04/21/2012   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

## 2021-08-05 ENCOUNTER — Inpatient Hospital Stay (HOSPITAL_COMMUNITY): Payer: Medicare PPO

## 2021-08-05 DIAGNOSIS — Z66 Do not resuscitate: Secondary | ICD-10-CM | POA: Diagnosis present

## 2021-08-05 DIAGNOSIS — D509 Iron deficiency anemia, unspecified: Secondary | ICD-10-CM | POA: Diagnosis present

## 2021-08-05 DIAGNOSIS — R410 Disorientation, unspecified: Secondary | ICD-10-CM | POA: Diagnosis not present

## 2021-08-05 DIAGNOSIS — I129 Hypertensive chronic kidney disease with stage 1 through stage 4 chronic kidney disease, or unspecified chronic kidney disease: Secondary | ICD-10-CM | POA: Diagnosis present

## 2021-08-05 DIAGNOSIS — Z20822 Contact with and (suspected) exposure to covid-19: Secondary | ICD-10-CM | POA: Diagnosis present

## 2021-08-05 DIAGNOSIS — W1830XA Fall on same level, unspecified, initial encounter: Secondary | ICD-10-CM | POA: Diagnosis present

## 2021-08-05 DIAGNOSIS — Z87891 Personal history of nicotine dependence: Secondary | ICD-10-CM | POA: Diagnosis not present

## 2021-08-05 DIAGNOSIS — Z7189 Other specified counseling: Secondary | ICD-10-CM | POA: Diagnosis not present

## 2021-08-05 DIAGNOSIS — M549 Dorsalgia, unspecified: Secondary | ICD-10-CM | POA: Diagnosis present

## 2021-08-05 DIAGNOSIS — Z90710 Acquired absence of both cervix and uterus: Secondary | ICD-10-CM | POA: Diagnosis not present

## 2021-08-05 DIAGNOSIS — Y92002 Bathroom of unspecified non-institutional (private) residence single-family (private) house as the place of occurrence of the external cause: Secondary | ICD-10-CM | POA: Diagnosis not present

## 2021-08-05 DIAGNOSIS — M8088XD Other osteoporosis with current pathological fracture, vertebra(e), subsequent encounter for fracture with routine healing: Secondary | ICD-10-CM | POA: Diagnosis present

## 2021-08-05 DIAGNOSIS — I951 Orthostatic hypotension: Secondary | ICD-10-CM | POA: Diagnosis present

## 2021-08-05 DIAGNOSIS — R627 Adult failure to thrive: Secondary | ICD-10-CM | POA: Diagnosis present

## 2021-08-05 DIAGNOSIS — G8929 Other chronic pain: Secondary | ICD-10-CM | POA: Diagnosis present

## 2021-08-05 DIAGNOSIS — W19XXXA Unspecified fall, initial encounter: Secondary | ICD-10-CM | POA: Diagnosis not present

## 2021-08-05 DIAGNOSIS — E876 Hypokalemia: Secondary | ICD-10-CM | POA: Diagnosis present

## 2021-08-05 DIAGNOSIS — Z515 Encounter for palliative care: Secondary | ICD-10-CM | POA: Diagnosis not present

## 2021-08-05 DIAGNOSIS — E860 Dehydration: Secondary | ICD-10-CM | POA: Diagnosis present

## 2021-08-05 DIAGNOSIS — Z880 Allergy status to penicillin: Secondary | ICD-10-CM | POA: Diagnosis not present

## 2021-08-05 DIAGNOSIS — Z681 Body mass index (BMI) 19 or less, adult: Secondary | ICD-10-CM | POA: Diagnosis not present

## 2021-08-05 DIAGNOSIS — N1832 Chronic kidney disease, stage 3b: Secondary | ICD-10-CM | POA: Diagnosis present

## 2021-08-05 DIAGNOSIS — F03911 Unspecified dementia, unspecified severity, with agitation: Secondary | ICD-10-CM | POA: Diagnosis present

## 2021-08-05 DIAGNOSIS — M353 Polymyalgia rheumatica: Secondary | ICD-10-CM | POA: Diagnosis present

## 2021-08-05 DIAGNOSIS — Y92239 Unspecified place in hospital as the place of occurrence of the external cause: Secondary | ICD-10-CM | POA: Diagnosis not present

## 2021-08-05 DIAGNOSIS — I480 Paroxysmal atrial fibrillation: Secondary | ICD-10-CM | POA: Diagnosis present

## 2021-08-05 DIAGNOSIS — F05 Delirium due to known physiological condition: Secondary | ICD-10-CM | POA: Diagnosis present

## 2021-08-05 DIAGNOSIS — I248 Other forms of acute ischemic heart disease: Secondary | ICD-10-CM | POA: Diagnosis present

## 2021-08-05 DIAGNOSIS — R55 Syncope and collapse: Secondary | ICD-10-CM | POA: Diagnosis present

## 2021-08-05 DIAGNOSIS — Z7952 Long term (current) use of systemic steroids: Secondary | ICD-10-CM | POA: Diagnosis not present

## 2021-08-05 DIAGNOSIS — S0003XA Contusion of scalp, initial encounter: Secondary | ICD-10-CM | POA: Diagnosis present

## 2021-08-05 DIAGNOSIS — E43 Unspecified severe protein-calorie malnutrition: Secondary | ICD-10-CM | POA: Diagnosis present

## 2021-08-05 LAB — BASIC METABOLIC PANEL
Anion gap: 12 (ref 5–15)
BUN: 15 mg/dL (ref 8–23)
CO2: 21 mmol/L — ABNORMAL LOW (ref 22–32)
Calcium: 8.8 mg/dL — ABNORMAL LOW (ref 8.9–10.3)
Chloride: 105 mmol/L (ref 98–111)
Creatinine, Ser: 1.22 mg/dL — ABNORMAL HIGH (ref 0.44–1.00)
GFR, Estimated: 43 mL/min — ABNORMAL LOW (ref >60)
Glucose, Bld: 89 mg/dL (ref 70–99)
Potassium: 3.1 mmol/L — ABNORMAL LOW (ref 3.5–5.1)
Sodium: 138 mmol/L (ref 135–145)

## 2021-08-05 LAB — CBC WITH DIFFERENTIAL/PLATELET
Abs Immature Granulocytes: 0.16 10*3/uL — ABNORMAL HIGH (ref 0.00–0.07)
Basophils Absolute: 0 10*3/uL (ref 0.0–0.1)
Basophils Relative: 0 %
Eosinophils Absolute: 0 10*3/uL (ref 0.0–0.5)
Eosinophils Relative: 0 %
HCT: 33 % — ABNORMAL LOW (ref 36.0–46.0)
Hemoglobin: 10.6 g/dL — ABNORMAL LOW (ref 12.0–15.0)
Immature Granulocytes: 2 %
Lymphocytes Relative: 5 %
Lymphs Abs: 0.5 10*3/uL — ABNORMAL LOW (ref 0.7–4.0)
MCH: 32.1 pg (ref 26.0–34.0)
MCHC: 32.1 g/dL (ref 30.0–36.0)
MCV: 100 fL (ref 80.0–100.0)
Monocytes Absolute: 0.7 10*3/uL (ref 0.1–1.0)
Monocytes Relative: 7 %
Neutro Abs: 8.9 10*3/uL — ABNORMAL HIGH (ref 1.7–7.7)
Neutrophils Relative %: 86 %
Platelets: 146 10*3/uL — ABNORMAL LOW (ref 150–400)
RBC: 3.3 MIL/uL — ABNORMAL LOW (ref 3.87–5.11)
RDW: 14.2 % (ref 11.5–15.5)
WBC: 10.3 10*3/uL (ref 4.0–10.5)
nRBC: 0 % (ref 0.0–0.2)

## 2021-08-05 LAB — MAGNESIUM: Magnesium: 1.9 mg/dL (ref 1.7–2.4)

## 2021-08-05 MED ORDER — POTASSIUM CHLORIDE CRYS ER 20 MEQ PO TBCR
40.0000 meq | EXTENDED_RELEASE_TABLET | ORAL | Status: AC
  Administered 2021-08-05 (×2): 40 meq via ORAL
  Filled 2021-08-05 (×2): qty 2

## 2021-08-05 MED ORDER — QUETIAPINE FUMARATE 25 MG PO TABS
25.0000 mg | ORAL_TABLET | Freq: Every day | ORAL | Status: DC
  Administered 2021-08-05: 21:00:00 25 mg via ORAL
  Filled 2021-08-05: qty 1

## 2021-08-05 MED ORDER — SODIUM CHLORIDE 0.9 % IV SOLN: INTRAVENOUS | Status: DC

## 2021-08-05 MED ORDER — HALOPERIDOL LACTATE 5 MG/ML IJ SOLN
1.0000 mg | Freq: Four times a day (QID) | INTRAMUSCULAR | Status: DC | PRN
  Administered 2021-08-05: 20:00:00 2 mg via INTRAVENOUS
  Administered 2021-08-05: 02:00:00 1 mg via INTRAVENOUS
  Administered 2021-08-10 – 2021-08-13 (×2): 2 mg via INTRAVENOUS
  Filled 2021-08-05 (×5): qty 1

## 2021-08-05 NOTE — Progress Notes (Signed)
MD paged after pt attempted to exit the bed multiple times. Tele sitter order discontinued and 1:1 safety attendant ordered. Sitter at bedside.

## 2021-08-05 NOTE — Progress Notes (Signed)
Pt found on the floor at the foot of the bed on her bottom.Pt placed back in bed. Pt denies hitting head or new pain. VS obtained. MD notified. No new orders. Family called, Mariana Arn, voicemail left.

## 2021-08-05 NOTE — Progress Notes (Addendum)
PROGRESS NOTE    Jodi Snyder  [id number] DOB: [birth year] DOA: 08/03/2021 PCP: [name]., MD   Brief Narrative:  Patient is a 87 year old female with chronic ambulation dysfunction on rolling walker, PAF on Eliquis, HTN, CKD stage IIIb, chronic iron deficiency anemia, PMR on steroids, hydroxychloroquine, osteoporosis, chronic lumbar spine fracture on TLSO presented with syncopal episode. Patient lives by herself, has been getting recently more forgetful.  She has chronic problem of dehydration and prepares 3 bottles of 8 ounces water at bedside every day to prevent that.  On the morning of admission she noticed that she still had 2 bottles of water not finished from yesterday.  Patient went to use the bathroom and while sitting on the toilet, started to feel dizzy.  When she stood up to reach her walker, she missed and fell into the bathtub and hit her head on the left side.  Patient has a known L1 compression fracture and was recently prescribed a TLSO brace but had not been using it for ambulation.  She also reported headache on the left and neck pain, back pain which is chronic. CT head showed left parietal scalp hematoma otherwise CT head and neck showed no acute fracture or dislocation.  CT spine showed L1 and T3 compression fracture, chronic  Assessment & Plan:   Principal Problem:   Syncope Active Problems:   Syncope, vasovagal   Syncope/dehydration/orthostatic hypotension: Likely either vasovagal or secondary to orthostatic hypotension.  She is still positive.  Looks clinically dehydrated.  We will start her on gentle hydration and monitor orthostatic vitals. -2D echo 01/2017 had shown EF of 60 to 65%, G2 DD.  Nuclear medicine stress test EF 65%, low risk study in 12/2019 -Troponins 24-21, EKG showed rate 64, normal sinus rhythm, no acute ST-T wave changes suggestive of  ischemia.  Unwitnessed fall: Reportedly Pt found on the floor at the foot of the bed on her bottom  around 3 AM on 08/05/2021.  Pt denied hitting head or new pain at the time however this morning, she tells me that she fell on her right hip and she was complaining of hip pain.  I will obtain hip x-ray to rule out fracture.  Hypokalemia: 3.1.  Will replace.  CKD stage IIIb: at baseline.   Acute on chronic back pain, T3 compression fracture, L1 compression fracture -Patient had L1 compression fracture from a fall sustained last month, stable appearance on CT.  She was recently prescribed vasopressin PT evaluation -CT C-spine showed no fractures, compression fracture at T1, T3 with loss of height anteriorly 80%, posterior bowing of the posterior margin of vertebral body, fracture of the T3 right facet, likely chronic per CT chest abdomen pelvis.  Per friend who is at the bedside, patient lives at independent living facility, they were already working on finding a place for her at assisted living facility however she is going to need higher level [facility] at [facility] before she returns to assisted living facility.   Dementia/delirium: Patient still appears to be delirious.  For some reason, she is on Xanax twice daily scheduled.  I will discontinue that as benzodiazepines are known to cause or worsen delirium.  I will increase her Seroquel to 25 mg nightly.  Delirium precautions placed as well. 1. Avoid benzodiazepines, antihistamines, anticholinergics, and minimize opiate use as these may worsen delirium. 2: Assess, prevent and manage pain as lack of treatment can result in delirium.  3: Provide appropriate lighting and clear signage; a clock and  calendar should be easily visible to the patient. 4: Monitor environmental factors. Reduce light and noise at night (close shades, turn off lights, turn off TV, ect). Correct any alterations in sleep cycle. 5: Reorient the patient to person, place, time and situation on each encounter.  6: Correct sensory deficits if possible (replace eye glasses, hearing aids,  ect). 7: Avoid restraints if able. Severely delirious patients benefit from constant observation by a sitter.  Paroxysmal atrial fibrillation -Rate controlled, continue Cardizem, amiodaro -Continue apixaban 2.5 mg twice daily   Essential hypertension: Continue lisinopril, diltiazem, as needed hydralazine.   Polymyalgia rheumatica -Continue prednisone 5 mg daily, hydroxychloroquine, -Continue PPI   Underweight, protein calorie malnutrition Estimated body mass index is 17.71 kg/m as calculated from the following:   Height as of this encounter: 5\' 3"  (1.6 m).   Weight as of this encounter: 45.4 kg  DVT prophylaxis: apixaban (ELIQUIS) tablet 2.5 mg Start: 08/04/21 1000   Code Status: DNR  Family Communication: Patient's friend present at bedside.  Status is: Inpatient  Remains inpatient appropriate because: Still delirious and very weak  Estimated body mass index is 15.36 kg/m as calculated from the following:   Height as of this encounter: 5\' 2"  (1.575 m).   Weight as of this encounter: 38.1 kg.  Nutritional Assessment: Body mass index is 15.36 kg/m.Marland Kitchen Seen by dietician.  I agree with the assessment and plan as outlined below: Nutrition Status:   Skin Assessment: I have examined the patient's skin and I agree with the wound assessment as performed by the wound care RN as outlined below:  Consultants:  None  Procedures:  None  Antimicrobials:  Anti-infectives (From admission, onward)    Start     Dose/Rate Route Frequency Ordered Stop   08/04/21 1000  hydroxychloroquine (PLAQUENIL) tablet 200 mg        200 mg Oral Daily 08/03/21 1831            Subjective: Patient seen and examined.  She was initially alert and mostly oriented but during my conversation, she fell asleep.  Her friend was at the bedside.  Patient was complaining of right hip pain before she fell asleep.  Objective: Vitals:   08/05/21 0740 08/05/21 0800 08/05/21 0814 08/05/21 1146  BP: 138/69  (!) 151/84 134/80 (!) 154/76  Pulse:    80  Resp: 17   19  Temp:    97.9 F (36.6 C)  TempSrc:    Oral  SpO2:    96%  Weight:      Height:        Intake/Output Summary (Last 24 hours) at 08/05/2021 1337 Last data filed at 08/05/2021 1237 Gross per 24 hour  Intake 30 ml  Output 0 ml  Net 30 ml   Filed Weights   08/04/21 1815 08/05/21 0054 08/05/21 0411  Weight: 40.2 kg 38.4 kg 38.1 kg    Examination:  General exam: Appears calm and comfortable, lean and cachectic Respiratory system: Clear to auscultation. Respiratory effort normal. Cardiovascular system: S1 & S2 heard, RRR. No JVD, murmurs, rubs, gallops or clicks. No pedal edema. Gastrointestinal system: Abdomen is nondistended, soft and nontender. No organomegaly or masses felt. Normal bowel sounds heard. Central nervous system: Alert and oriented. No focal neurological deficits. Extremities: Symmetric 5 x 5 power. Skin: No rashes, lesions or ulcers   Data Reviewed: I have personally reviewed following labs and imaging studies  CBC: Recent Labs  Lab 08/03/21 1442 08/03/21 1506 08/04/21 0443 08/05/21 [id number]  WBC 13.3*  --  6.8 10.3  NEUTROABS  --   --   --  8.9*  HGB 12.2 12.6 10.8* 10.6*  HCT 38.4 37.0 35.2* 33.0*  MCV 102.4*  --  104.1* 100.0  PLT 187  --  156 [id number]*   Basic Metabolic Panel: Recent Labs  Lab 08/03/21 1442 08/03/21 1506 08/04/21 0443 08/05/21 0918  NA 141 140 140 138  K 4.1 3.7 3.4* 3.1*  CL 105 105 106 105  CO2 26  --  25 21*  GLUCOSE 116* 110* 82 89  BUN 24* 27* 19 15  CREATININE 1.41* 1.50* 1.22* 1.22*  CALCIUM 9.0  --  8.4* 8.8*  MG  --   --   --  1.9   GFR: Estimated Creatinine Clearance: 19.5 mL/min (A) (by C-G formula based on SCr of 1.22 mg/dL (H)). Liver Function Tests: Recent Labs  Lab 08/03/21 1442  AST 52*  ALT 32  ALKPHOS 65  BILITOT 1.0  PROT 6.5  ALBUMIN 3.8   No results for input(s): LIPASE, AMYLASE in the last 168 hours. No results for input(s): AMMONIA in  the last 168 hours. Coagulation Profile: Recent Labs  Lab 08/03/21 1442  INR 1.2   Cardiac Enzymes: No results for input(s): CKTOTAL, CKMB, CKMBINDEX, TROPONINI in the last 168 hours. BNP (last 3 results) No results for input(s): PROBNP in the last 8760 hours. HbA1C: No results for input(s): HGBA1C in the last 72 hours. CBG: No results for input(s): GLUCAP in the last 168 hours. Lipid Profile: No results for input(s): CHOL, HDL, LDLCALC, TRIG, CHOLHDL, LDLDIRECT in the last 72 hours. Thyroid Function Tests: No results for input(s): TSH, T4TOTAL, FREET4, T3FREE, THYROIDAB in the last 72 hours. Anemia Panel: No results for input(s): VITAMINB12, FOLATE, FERRITIN, TIBC, IRON, RETICCTPCT in the last 72 hours. Sepsis Labs: Recent Labs  Lab 08/03/21 1514  LATICACIDVEN 1.5    Recent Results (from the past 240 hour(s))  Resp Panel by RT-PCR (Flu A&B, Covid) Nasopharyngeal Swab     Status: None   Collection Time: 08/03/21  2:42 PM   Specimen: Nasopharyngeal Swab; Nasopharyngeal(NP) swabs in vial transport medium  Result Value Ref Range Status   SARS Coronavirus 2 by RT PCR NEGATIVE NEGATIVE Final    Comment: (NOTE) SARS-CoV-2 target nucleic acids are NOT DETECTED.  The SARS-CoV-2 RNA is generally detectable in upper respiratory specimens during the acute phase of infection. The lowest concentration of SARS-CoV-2 viral copies this assay can detect is 138 copies/mL. A negative result does not preclude SARS-Cov-2 infection and should not be used as the sole basis for treatment or other patient management decisions. A negative result may occur with  improper specimen collection/handling, submission of specimen other than nasopharyngeal swab, presence of viral mutation(s) within the areas targeted by this assay, and inadequate number of viral copies(<138 copies/mL). A negative result must be combined with clinical observations, patient history, and epidemiological information. The  expected result is Negative.  Fact Sheet for Patients:  EntrepreneurPulse.com.au  Fact Sheet for Healthcare Providers:  IncredibleEmployment.be  This test is no t yet approved or cleared by the [place] FDA and  has been authorized for detection and/or diagnosis of SARS-CoV-2 by FDA under an Emergency Use Authorization (EUA). This EUA will remain  in effect (meaning this test can be used) for the duration of the COVID-19 declaration under Section 564(b)(1) of the Act, 21 U.S.C.section 360bbb-3(b)(1), unless the authorization is terminated  or revoked sooner.  Influenza A by PCR NEGATIVE NEGATIVE Final   Influenza B by PCR NEGATIVE NEGATIVE Final    Comment: (NOTE) The Xpert Xpress SARS-CoV-2/FLU/RSV plus assay is intended as an aid in the diagnosis of influenza from Nasopharyngeal swab specimens and should not be used as a sole basis for treatment. Nasal washings and aspirates are unacceptable for Xpert Xpress SARS-CoV-2/FLU/RSV testing.  Fact Sheet for Patients: BloggerCourse.com  Fact Sheet for Healthcare Providers: SeriousBroker.it  This test is not yet approved or cleared by the [place] FDA and has been authorized for detection and/or diagnosis of SARS-CoV-2 by FDA under an Emergency Use Authorization (EUA). This EUA will remain in effect (meaning this test can be used) for the duration of the COVID-19 declaration under Section 564(b)(1) of the Act, 21 U.S.C. section 360bbb-3(b)(1), unless the authorization is terminated or revoked.  Performed at [facility], [address] [place], [place] [address]       Radiology Studies: CT HEAD WO CONTRAST  Result Date: 08/03/2021 CLINICAL DATA:  Syncopal episode with fall. EXAM: CT HEAD WITHOUT CONTRAST TECHNIQUE: Contiguous axial images were obtained from the base of the skull through the vertex without intravenous  contrast. COMPARISON:  06/26/2021 FINDINGS: Brain: Generalized atrophy. Chronic small-vessel ischemic changes of the white matter. No sign of acute infarction, mass lesion, hemorrhage, hydrocephalus or extra-axial collection. Vascular: There is atherosclerotic calcification of the major vessels at the base of the brain. Skull: No skull fracture. Sinuses/Orbits: Clear/normal Other: Left parietal scalp hematoma. IMPRESSION: No acute intracranial injury. Left parietal scalp hematoma. No underlying skull fracture. Electronically Signed   By: Paulina Fusi M.D.   On: 08/03/2021 16:37   CT CERVICAL SPINE WO CONTRAST  Result Date: 08/03/2021 CLINICAL DATA:  Syncopal episode with fall and trauma to the head and neck. EXAM: CT CERVICAL SPINE WITHOUT CONTRAST TECHNIQUE: Multidetector CT imaging of the cervical spine was performed without intravenous contrast. Multiplanar CT image reconstructions were also generated. COMPARISON:  12/06/2020 FINDINGS: Alignment: Scoliotic curvature convex to the left. Skull base and vertebrae: Compression fracture at T1 with loss of height of about 10%. Compression fracture of T3 with loss of height anteriorly of 80% and posterior bowing of the posterior margin of the vertebral body. Fracture of the right facet at T3. Soft tissues and spinal canal: No soft tissue injury seen. Disc levels: Chronic degenerative spondylosis at C5-6. No compressive stenosis visible. Upper chest: See results of chest CT. Other: None IMPRESSION: Cervicothoracic curvature convex to the left. No fracture seen in the cervical region. Compression fracture at T1 with loss of height of about 10%. Compression fracture at T3 with loss of height anteriorly of 80%. Posterior bowing of the posterior margin of the vertebral body. Fracture of the T3 right facet. Electronically Signed   By: Paulina Fusi M.D.   On: 08/03/2021 16:40   CT CHEST ABDOMEN PELVIS W CONTRAST  Result Date: 08/03/2021 CLINICAL DATA:  Recent  syncopal episode with pain, initial encounter EXAM: CT CHEST, ABDOMEN, AND PELVIS WITH CONTRAST TECHNIQUE: Multidetector CT imaging of the chest, abdomen and pelvis was performed following the standard protocol during bolus administration of intravenous contrast. CONTRAST:  41mL OMNIPAQUE IOHEXOL 300 MG/ML  SOLN COMPARISON:  12/06/2020 FINDINGS: CT CHEST FINDINGS Cardiovascular: Atherosclerotic calcifications of the thoracic aorta are noted without aneurysmal dilatation or dissection. No cardiac enlargement is noted. Mild coronary calcifications are seen. No large central pulmonary embolus is noted although timing was not performed for embolus evaluation. Mediastinum/Nodes: Thoracic inlet is within  normal limits. No sizable hilar or mediastinal adenopathy noted. The esophagus as visualized is within normal limits. Lungs/Pleura: Few scattered calcified granulomas are seen. The lungs are well aerated. Chronic scarring with calcification is noted in the upper lobes bilaterally stable from previous exam from 12/06/2020. Musculoskeletal: Degenerative changes of the thoracic spine are seen. No acute rib fracture is noted. Chronic appearing T3 compression deformity is noted which is new from the prior exam. Old healed sternal fracture is noted. CT ABDOMEN PELVIS FINDINGS Hepatobiliary: No focal liver abnormality is seen. No gallstones, gallbladder wall thickening, or biliary dilatation. Pancreas: Unremarkable. No pancreatic ductal dilatation or surrounding inflammatory changes. Spleen: Normal in size without focal abnormality. Adrenals/Urinary Tract: Adrenal glands are within normal limits bilaterally. Kidneys demonstrate cystic changes bilaterally. No renal calculi or obstructive changes are noted. Delayed images demonstrate normal excretion of contrast material. Bladder is well distended and within normal limits. Stomach/Bowel: The appendix is not well visualized although no inflammatory changes are identified to suggest  appendicitis. Scattered fecal material is noted throughout the colon. Small bowel and stomach are within normal limits. Vascular/Lymphatic: Aortic atherosclerosis. No enlarged abdominal or pelvic lymph nodes. Reproductive: Status post hysterectomy. No adnexal masses. Other: No abdominal wall hernia or abnormality. No abdominopelvic ascites. Musculoskeletal: Degenerative changes of lumbar spine are noted. L1 compression deformity is noted which is new from prior examination from April of 2022 but stable from prior plain film from 06/16/2021. IMPRESSION: CT of the chest: Changes of prior granulomatous disease as well as calcified and noncalcified areas of scarring in the upper lobes bilaterally. T3 compression deformity new from prior exam in April of 2022 but of a chronic appearance. CT of the abdomen and pelvis: L1 compression deformity which is stable from prior plain film examination in November of 2022. No acute abnormality is noted. Electronically Signed   By: Inez Catalina M.D.   On: 08/03/2021 16:44   DG HIP UNILAT WITH PELVIS 2-3 VIEWS RIGHT  Result Date: 08/05/2021 CLINICAL DATA:  Status post fall EXAM: DG HIP (WITH OR WITHOUT PELVIS) 2-3V RIGHT COMPARISON:  None. FINDINGS: There is no evidence of hip fracture or dislocation. Limited evaluation of the sacrum due to overlying bowel gas. There is no evidence of arthropathy or other focal bone abnormality. IMPRESSION: Negative. Electronically Signed   By: Yetta Glassman M.D.   On: 08/05/2021 12:04    Scheduled Meds:  amiodarone  200 mg Oral Daily   apixaban  2.5 mg Oral BID   brinzolamide  1 drop Both Eyes TID   calcitonin (salmon)  1 spray Alternating Nares Daily   cycloSPORINE  1 drop Both Eyes BID   diltiazem  120 mg Oral Daily   ferrous sulfate  325 mg Oral Daily   hydroxychloroquine  200 mg Oral Daily   lisinopril  10 mg Oral Daily   Netarsudil-Latanoprost  1 drop Both Eyes QHS   pantoprazole  40 mg Oral Daily   predniSONE  5 mg Oral  Daily   QUEtiapine  25 mg Oral QHS   Continuous Infusions:  sodium chloride 75 mL/hr at 08/05/21 1224     LOS: 0 days   Time spent: 35 minutes   Darliss Cheney, MD [facility]  08/05/2021, 1:37 PM  Please page via [facility] and do not message via secure chat for anything urgent. Secure chat can be used for anything non urgent.  How to contact the [facility] Attending or Consulting provider [place] or covering provider during after hours 7P -  7A, for this patient?  Check the care team in [facility] and look for a) attending/consulting [facility] provider listed and b) the [facility] team listed. Page or secure chat 7A-7P. Log into [email or web address] and use [place]'s universal password to access. If you do not have the password, please contact the hospital operator. Locate the [facility] provider you are looking for under [facility] and page to a number that you can be directly reached. If you still have difficulty reaching the provider, please page the [facility] (Director on Call) for the Hospitalists listed on amion for assistance.

## 2021-08-05 NOTE — Progress Notes (Signed)
Pt keeps removing telemetry. States she "cannot stand the wires" and "just wants it all off". Telemetry removed in effort to calm patient. PRN given for agitation. MD notified to see of tele can be dcd. Prn meds adjusted and MD states to put tele back no when agitation is resolved.

## 2021-08-05 NOTE — Progress Notes (Signed)
Mobility Specialist Progress Note:   08/05/21 1536  Mobility  Activity Transferred:  Chair to bed  Level of Assistance Minimal assist, patient does 75% or more  Assistive Device  (HHA)  Distance Ambulated (ft) 4 ft  Mobility Ambulated with assistance in room  Mobility Response Tolerated well  Mobility performed by Mobility specialist;Nurse  $Mobility charge 1 Mobility   Pt requesting to go back to bed.   [facility] Designer, jewellery Phone [phone] Secondary Phone [phone]

## 2021-08-06 LAB — BASIC METABOLIC PANEL
Anion gap: 8 (ref 5–15)
BUN: 14 mg/dL (ref 8–23)
CO2: 18 mmol/L — ABNORMAL LOW (ref 22–32)
Calcium: 8.4 mg/dL — ABNORMAL LOW (ref 8.9–10.3)
Chloride: 111 mmol/L (ref 98–111)
Creatinine, Ser: 1.28 mg/dL — ABNORMAL HIGH (ref 0.44–1.00)
GFR, Estimated: 41 mL/min — ABNORMAL LOW (ref >60)
Glucose, Bld: 89 mg/dL (ref 70–99)
Potassium: 4.1 mmol/L (ref 3.5–5.1)
Sodium: 137 mmol/L (ref 135–145)

## 2021-08-06 MED ORDER — QUETIAPINE FUMARATE 50 MG PO TABS
50.0000 mg | ORAL_TABLET | Freq: Every day | ORAL | Status: DC
  Administered 2021-08-06: 22:00:00 50 mg via ORAL
  Filled 2021-08-06: qty 1

## 2021-08-06 MED ORDER — ADULT MULTIVITAMIN W/MINERALS CH
1.0000 | ORAL_TABLET | Freq: Every day | ORAL | Status: DC
  Administered 2021-08-07 – 2021-08-11 (×5): 1 via ORAL
  Filled 2021-08-06 (×5): qty 1

## 2021-08-06 MED ORDER — ENSURE ENLIVE PO LIQD
237.0000 mL | Freq: Two times a day (BID) | ORAL | Status: DC
  Administered 2021-08-07 – 2021-08-10 (×2): 237 mL via ORAL

## 2021-08-06 NOTE — Progress Notes (Addendum)
PROGRESS NOTE    Jodi Snyder  [id number] DOB: 1932-04-13 DOA: 08/03/2021 PCP: [name]., MD   Brief Narrative:  Patient is a 87 year old female with chronic ambulation dysfunction on rolling walker, PAF on Eliquis, HTN, CKD stage IIIb, chronic iron deficiency anemia, PMR on steroids, hydroxychloroquine, osteoporosis, chronic lumbar spine fracture on TLSO presented with syncopal episode. Patient lives by herself, has been getting recently more forgetful.  She has chronic problem of dehydration and prepares 3 bottles of 8 ounces water at bedside every day to prevent that.  On the morning of admission she noticed that she still had 2 bottles of water not finished from yesterday.  Patient went to use the bathroom and while sitting on the toilet, started to feel dizzy.  When she stood up to reach her walker, she missed and fell into the bathtub and hit her head on the left side.  Patient has a known L1 compression fracture and was recently prescribed a TLSO brace but had not been using it for ambulation.  She also reported headache on the left and neck pain, back pain which is chronic. CT head showed left parietal scalp hematoma otherwise CT head and neck showed no acute fracture or dislocation.  CT spine showed L1 and T3 compression fracture, chronic  Assessment & Plan:   Principal Problem:   Syncope Active Problems:   Syncope, vasovagal   Syncope/dehydration/orthostatic hypotension: Likely either vasovagal or secondary to orthostatic hypotension.  Although better than yesterday but still looks clinically dehydrated.   -2D echo 01/2017 had shown EF of 60 to 65%, G2 DD.  Nuclear medicine stress test EF 65%, low risk study in 12/2019 -Troponins 24-21, EKG showed rate 64, normal sinus rhythm, no acute ST-T wave changes suggestive of  ischemia.  Orthostatic vital signs ordered but not done.  We will continue IV fluids.  Unwitnessed fall: Reportedly Pt found on the floor at the foot of  the bed on her bottom around 3 AM on 08/05/2021.  Pt denied hitting head or new pain at the time however yesterday morning, she was complaining of right hip pain, x-ray right hip negative for fracture.    Hypokalemia: Resolved.  CKD stage IIIb: at baseline.   Acute on chronic back pain, T3 compression fracture, L1 compression fracture -Patient had L1 compression fracture from a fall sustained last month, stable appearance on CT.  She was recently prescribed vasopressin PT evaluation -CT C-spine showed no fractures, compression fracture at T1, T3 with loss of height anteriorly 80%, posterior bowing of the posterior margin of vertebral body, fracture of the T3 right facet, likely chronic per CT chest abdomen pelvis.  Per friend who is at the bedside, patient lives at independent living facility, they were already working on finding a place for her at assisted living facility however she is going to need [facility] level [facility] before she returns to assisted living facility.   Dementia/delirium: Still confused and delirious, needs one-to-one sitter.  Will increase Seroquel to 50 units.  Delirium precautions placed as well. 1. Avoid benzodiazepines, antihistamines, anticholinergics, and minimize opiate use as these may worsen delirium. 2: Assess, prevent and manage pain as lack of treatment can result in delirium.  3: Provide appropriate lighting and clear signage; a clock and calendar should be easily visible to the patient. 4: Monitor environmental factors. Reduce light and noise at night (close shades, turn off lights, turn off TV, ect). Correct any alterations in sleep cycle. 5: Reorient the patient  to person, place, time and situation on each encounter.  6: Correct sensory deficits if possible (replace eye glasses, hearing aids, ect). 7: Avoid restraints if able. Severely delirious patients benefit from constant observation by a sitter.  Paroxysmal atrial fibrillation -Rate controlled,  continue Cardizem, amiodaro -Continue apixaban 2.5 mg twice daily   Essential hypertension: Only slightly elevated, continue lisinopril, diltiazem, as needed hydralazine.   Polymyalgia rheumatica -Continue prednisone 5 mg daily, hydroxychloroquine, -Continue PPI   Underweight, protein calorie malnutrition Estimated body mass index is 17.71 kg/m as calculated from the following:   Height as of this encounter: 5\' 3"  (1.6 m).   Weight as of this encounter: 45.4 kg  DVT prophylaxis: apixaban (ELIQUIS) tablet 2.5 mg Start: 08/04/21 1000   Code Status: DNR  Family Communication: None at bedside.  Discussed with son over the phone  Status is: Inpatient  Remains inpatient appropriate because: Still delirious and very weak  Estimated body mass index is 15.31 kg/m as calculated from the following:   Height as of this encounter: 5\' 2"  (1.575 m).   Weight as of this encounter: 38 kg.  Nutritional Assessment: Body mass index is 15.31 kg/m.Marland Kitchen Seen by dietician.  I agree with the assessment and plan as outlined below: Nutrition Status:   Skin Assessment: I have examined the patient's skin and I agree with the wound assessment as performed by the wound care RN as outlined below:  Consultants:  None  Procedures:  None  Antimicrobials:  Anti-infectives (From admission, onward)    Start     Dose/Rate Route Frequency Ordered Stop   08/04/21 1000  hydroxychloroquine (PLAQUENIL) tablet 200 mg        200 mg Oral Daily 08/03/21 1831            Subjective:  Patient seen and examined this morning.  Sitter at the bedside.  Patient was alert but confused.  She looked comfortable but slightly dehydrated.  Objective: Vitals:   08/06/21 0255 08/06/21 0528 08/06/21 0717 08/06/21 1306  BP:  (!) 147/79 (!) 171/86 (!) 168/81  Pulse:  80 99 100  Resp:  15 16 16   Temp:  98 F (36.7 C)    TempSrc:  Oral    SpO2:   96% 100%  Weight: 38 kg     Height:        Intake/Output Summary  (Last 24 hours) at 08/06/2021 1323 Last data filed at 08/06/2021 0500 Gross per 24 hour  Intake 140 ml  Output 400 ml  Net -260 ml    Filed Weights   08/05/21 0054 08/05/21 0411 08/06/21 0255  Weight: 38.4 kg 38.1 kg 38 kg    Examination:  My exam general exam: Appears calm and comfortable, cachectic Respiratory system: Clear to auscultation. Respiratory effort normal. Cardiovascular system: S1 & S2 heard, RRR. No JVD, murmurs, rubs, gallops or clicks. No pedal edema. Gastrointestinal system: Abdomen is nondistended, soft and nontender. No organomegaly or masses felt. Normal bowel sounds heard. Central nervous system: Alert but not oriented. Extremities: Symmetric 5 x 5 power. Skin: No rashes, lesions or ulcers.   Data Reviewed: I have personally reviewed following labs and imaging studies  CBC: Recent Labs  Lab 08/03/21 1442 08/03/21 1506 08/04/21 0443 08/05/21 0918  WBC 13.3*  --  6.8 10.3  NEUTROABS  --   --   --  8.9*  HGB 12.2 12.6 10.8* 10.6*  HCT 38.4 37.0 35.2* 33.0*  MCV 102.4*  --  104.1* 100.0  PLT 187  --  156 146*    Basic Metabolic Panel: Recent Labs  Lab 08/03/21 1442 08/03/21 1506 08/04/21 0443 08/05/21 0918 08/06/21 0232  NA 141 140 140 138 137  K 4.1 3.7 3.4* 3.1* 4.1  CL 105 105 106 105 111  CO2 26  --  25 21* 18*  GLUCOSE 116* 110* 82 89 89  BUN 24* 27* 19 15 14   CREATININE 1.41* 1.50* 1.22* 1.22* 1.28*  CALCIUM 9.0  --  8.4* 8.8* 8.4*  MG  --   --   --  1.9  --     GFR: Estimated Creatinine Clearance: 18.6 mL/min (A) (by C-G formula based on SCr of 1.28 mg/dL (H)). Liver Function Tests: Recent Labs  Lab 08/03/21 1442  AST 52*  ALT 32  ALKPHOS 65  BILITOT 1.0  PROT 6.5  ALBUMIN 3.8    No results for input(s): LIPASE, AMYLASE in the last 168 hours. No results for input(s): AMMONIA in the last 168 hours. Coagulation Profile: Recent Labs  Lab 08/03/21 1442  INR 1.2    Cardiac Enzymes: No results for input(s): CKTOTAL,  CKMB, CKMBINDEX, TROPONINI in the last 168 hours. BNP (last 3 results) No results for input(s): PROBNP in the last 8760 hours. HbA1C: No results for input(s): HGBA1C in the last 72 hours. CBG: No results for input(s): GLUCAP in the last 168 hours. Lipid Profile: No results for input(s): CHOL, HDL, LDLCALC, TRIG, CHOLHDL, LDLDIRECT in the last 72 hours. Thyroid Function Tests: No results for input(s): TSH, T4TOTAL, FREET4, T3FREE, THYROIDAB in the last 72 hours. Anemia Panel: No results for input(s): VITAMINB12, FOLATE, FERRITIN, TIBC, IRON, RETICCTPCT in the last 72 hours. Sepsis Labs: Recent Labs  Lab 08/03/21 1514  LATICACIDVEN 1.5     Recent Results (from the past 240 hour(s))  Resp Panel by RT-PCR (Flu A&B, Covid) Nasopharyngeal Swab     Status: None   Collection Time: 08/03/21  2:42 PM   Specimen: Nasopharyngeal Swab; Nasopharyngeal(NP) swabs in vial transport medium  Result Value Ref Range Status   SARS Coronavirus 2 by RT PCR NEGATIVE NEGATIVE Final    Comment: (NOTE) SARS-CoV-2 target nucleic acids are NOT DETECTED.  The SARS-CoV-2 RNA is generally detectable in upper respiratory specimens during the acute phase of infection. The lowest concentration of SARS-CoV-2 viral copies this assay can detect is 138 copies/mL. A negative result does not preclude SARS-Cov-2 infection and should not be used as the sole basis for treatment or other patient management decisions. A negative result may occur with  improper specimen collection/handling, submission of specimen other than nasopharyngeal swab, presence of viral mutation(s) within the areas targeted by this assay, and inadequate number of viral copies(<138 copies/mL). A negative result must be combined with clinical observations, patient history, and epidemiological information. The expected result is Negative.  Fact Sheet for Patients:  08/05/21  Fact Sheet for Healthcare Providers:   BloggerCourse.com  This test is no t yet approved or cleared by the [employer] FDA and  has been authorized for detection and/or diagnosis of SARS-CoV-2 by FDA under an Emergency Use Authorization (EUA). This EUA will remain  in effect (meaning this test can be used) for the duration of the COVID-19 declaration under Section 564(b)(1) of the Act, 21 U.S.C.section 360bbb-3(b)(1), unless the authorization is terminated  or revoked sooner.       Influenza A by PCR NEGATIVE NEGATIVE Final   Influenza B by PCR NEGATIVE NEGATIVE Final    Comment: (NOTE) The Xpert Xpress SARS-CoV-2/FLU/RSV plus  assay is intended as an aid in the diagnosis of influenza from Nasopharyngeal swab specimens and should not be used as a sole basis for treatment. Nasal washings and aspirates are unacceptable for Xpert Xpress SARS-CoV-2/FLU/RSV testing.  Fact Sheet for Patients: EntrepreneurPulse.com.au  Fact Sheet for Healthcare Providers: IncredibleEmployment.be  This test is not yet approved or cleared by the [place] FDA and has been authorized for detection and/or diagnosis of SARS-CoV-2 by FDA under an Emergency Use Authorization (EUA). This EUA will remain in effect (meaning this test can be used) for the duration of the COVID-19 declaration under Section 564(b)(1) of the Act, 21 U.S.C. section 360bbb-3(b)(1), unless the authorization is terminated or revoked.  Performed at [facility], [place] [address] [place], [place] [address]        Radiology Studies: DG HIP UNILAT WITH PELVIS 2-3 VIEWS RIGHT  Result Date: 08/05/2021 CLINICAL DATA:  Status post fall EXAM: DG HIP (WITH OR WITHOUT PELVIS) 2-3V RIGHT COMPARISON:  None. FINDINGS: There is no evidence of hip fracture or dislocation. Limited evaluation of the sacrum due to overlying bowel gas. There is no evidence of arthropathy or other focal bone abnormality. IMPRESSION:  Negative. Electronically Signed   By: Yetta Glassman M.D.   On: 08/05/2021 12:04    Scheduled Meds:  amiodarone  200 mg Oral Daily   apixaban  2.5 mg Oral BID   brinzolamide  1 drop Both Eyes TID   calcitonin (salmon)  1 spray Alternating Nares Daily   cycloSPORINE  1 drop Both Eyes BID   diltiazem  120 mg Oral Daily   ferrous sulfate  325 mg Oral Daily   hydroxychloroquine  200 mg Oral Daily   lisinopril  10 mg Oral Daily   Netarsudil-Latanoprost  1 drop Both Eyes QHS   pantoprazole  40 mg Oral Daily   predniSONE  5 mg Oral Daily   QUEtiapine  25 mg Oral QHS   Continuous Infusions:  sodium chloride 75 mL/hr at 08/05/21 1224     LOS: 1 day   Time spent: 28 minutes   Darliss Cheney, MD [facility]  08/06/2021, 1:23 PM  Please page via Amion and do not message via secure chat for anything urgent. Secure chat can be used for anything non urgent.  How to contact the [facility] Attending or Consulting provider [place] or covering provider during after hours [place], for this patient?  Check the care team in [facility] and look for a) attending/consulting TR[facility] provider listed and b) the [facility] team listed. Page or secure chat [phone]. Log into [email or web address] and use [place]'s universal password to access. If you do not have the password, please contact the hospital operator. Locate the [facility] provider you are looking for under [facility] and page to a number that you can be directly reached. If you still have difficulty reaching the provider, please page the [facility] (Director on Call) for the Hospitalists listed on amion for assistance.

## 2021-08-06 NOTE — Progress Notes (Signed)
PT Cancellation Note  Patient Details Name: Jodi Snyder MRN: [id number] DOB: August 07, 1932   Cancelled Treatment:    Reason Eval/Treat Not Completed: Fatigue/lethargy limiting ability to participate. RN reports pt has been very confused, pulling at lines etc today. Upon arrival, pt was asleep with sitter reporting pt just calmed down and fell asleep. Will plan to follow-up later as time permits.   Raymond Gurney, PT, DPT Acute Rehabilitation Services  Pager: [phone] Office: [phone]    Jewel Baize 08/06/2021, 3:32 PM

## 2021-08-06 NOTE — Progress Notes (Signed)
Physical Therapy Treatment Patient Details Name: Jodi Snyder MRN: [id number] DOB: 29-Aug-1931 Today's Date: 08/06/2021   History of Present Illness 87 y.o. female who presented 12/19 with syncope and fall, noted previous L1 fracture and T3 fracture on imaging.  Pt is poor historian, has aggravation of back pain and frequent fall history.  Had negative head CT and is awaiting a TLSO.   PMH: atrial fibrillation, hypertension, discoid lupus, polymyalgia rheumatica, L1 fracture    PT Comments    Pt was lethargic but agreeable to session and donning clamshell brace this date. Pt with strong posterior bias and poor awareness into her balance deficits impacting her safety as she was unable to correct this lean despite max cues. Pt required modA for transfers to stand and take lateral steps at EOB to transfer bed > recliner due to her posterior bias, balance deficits, decreased activity tolerance, and lower extremity weakness. She is at high risk for falls and injuries due to her physical and cognitive deficits. Will continue to follow acutely. Current recommendations remain appropriate.    Recommendations for follow up therapy are one component of a multi-disciplinary discharge planning process, led by the attending physician.  Recommendations may be updated based on patient status, additional functional criteria and insurance authorization.  Follow Up Recommendations  Skilled nursing-short term rehab (<3 hours/day)     Assistance Recommended at Discharge Frequent or constant Supervision/Assistance  Equipment Recommendations  Rolling walker (2 wheels)    Recommendations for Other Services       Precautions / Restrictions Precautions Precautions: Fall;Back Precaution Booklet Issued: No Precaution Comments: Provided cues to maintain precautions throughout; sensative to light per family; bil mittens and sitter Required Braces or Orthoses: Spinal Brace Spinal Brace: Other (comment) (clamshell  type brace) Restrictions Weight Bearing Restrictions: No     Mobility  Bed Mobility Overal bed mobility: Needs Assistance Bed Mobility: Rolling;Sidelying to Sit Rolling: Mod assist Sidelying to sit: Max assist       General bed mobility comments: Pt cued to flex legs and reach across body to roll either direction in bed a couple times to donn clamshell brace, modA to complete. MaxA to manage legs and trunk to sit L EOB.    Transfers Overall transfer level: Needs assistance Equipment used: Rolling walker (2 wheels) Transfers: Sit to/from Stand;Bed to chair/wheelchair/BSC Sit to Stand: Mod assist     Step pivot transfers: Mod assist     General transfer comment: Sit to stand 2x from EOB, cuing pt to lean anteriorly as she displays a strong posterior bias, propping her posterior legs on the EOB each rep. ModA to power up and steady with each transfer and to direct to L with RW to transfer to chair.    Ambulation/Gait Ambulation/Gait assistance: Mod assist Gait Distance (Feet): 3 Feet (x2 bouts of ~2 ft > ~3 ft) Assistive device: Rolling walker (2 wheels) Gait Pattern/deviations: Step-to pattern;Narrow base of support;Decreased stride length;Shuffle;Leaning posteriorly Gait velocity: reduced Gait velocity interpretation: <1.31 ft/sec, indicative of household ambulator   General Gait Details: Pt with strong posterior bias and lethargy this date, having difficulty correcting posterior lean despite max cues. Thus, only performed lateral stepping at EOB for safety this date. ModA to steady and direct RW.   Stairs             Wheelchair Mobility    Modified Rankin (Stroke Patients Only)       Balance Overall balance assessment: Needs assistance Sitting-balance support: Feet supported;Bilateral upper extremity supported Sitting  balance-Leahy Scale: Poor Sitting balance - Comments: UE support and minA to prevent posterior lean when hands were on bed. Min guard when pt  was facilitated to lean forward with hands on RW. Postural control: Posterior lean Standing balance support: Bilateral upper extremity supported;During functional activity Standing balance-Leahy Scale: Poor Standing balance comment: Reliant on RW and modA                            Cognition Arousal/Alertness: Awake/alert;Lethargic;Suspect due to medications Behavior During Therapy: Flat affect Overall Cognitive Status: No family/caregiver present to determine baseline cognitive functioning                                 General Comments: Pt lethargic, likely due to meds as she was impulsive and restless, pulling at lines earlier per RN. Pt pleasant and following cues with increased processing time, needing simple one step cues at a time.        Exercises General Exercises - Lower Extremity Long Arc Quad: Strengthening;AROM;Both;10 reps;Seated Hip Flexion/Marching: Strengthening;AROM;Both;10 reps;Seated    General Comments        Pertinent Vitals/Pain Pain Assessment: Faces Faces Pain Scale: Hurts little more Pain Location: waist, R hip when sitting up with clamshell donned Pain Descriptors / Indicators: Grimacing;Guarding;Discomfort Pain Intervention(s): Limited activity within patient's tolerance;Monitored during session;Repositioned    Home Living                          Prior Function            PT Goals (current goals can now be found in the care plan section) Acute Rehab PT Goals Patient Stated Goal: agreeable to participate in session PT Goal Formulation: With patient Time For Goal Achievement: 08/18/21 Potential to Achieve Goals: Good Progress towards PT goals: Progressing toward goals    Frequency    Min 3X/week (in case pt is going back to her home)      PT Plan Current plan remains appropriate    Co-evaluation              AM-PAC PT "6 Clicks" Mobility   Outcome Measure  Help needed turning from your  back to your side while in a flat bed without using bedrails?: A Lot Help needed moving from lying on your back to sitting on the side of a flat bed without using bedrails?: A Lot Help needed moving to and from a bed to a chair (including a wheelchair)?: A Lot Help needed standing up from a chair using your arms (e.g., wheelchair or bedside chair)?: A Lot Help needed to walk in hospital room?: Total Help needed climbing 3-5 steps with a railing? : Total 6 Click Score: 10    End of Session Equipment Utilized During Treatment: Gait belt;Back brace (clamshell) Activity Tolerance: Patient limited by lethargy Patient left: with call bell/phone within reach;in chair;with chair alarm set;with nursing/sitter in room Nurse Communication: Mobility status PT Visit Diagnosis: Unsteadiness on feet (R26.81);Muscle weakness (generalized) (M62.81);Pain;Other abnormalities of gait and mobility (R26.89);Difficulty in walking, not elsewhere classified (R26.2) Pain - Right/Left:  (back) Pain - part of body:  (back)     Time: 3500-9381 PT Time Calculation (min) (ACUTE ONLY): 33 min  Charges:  $Therapeutic Activity: 23-37 mins  Moishe Spice, PT, DPT Acute Rehabilitation Services  Pager: [phone] Office: [place] 08/06/2021, 4:57 PM

## 2021-08-06 NOTE — Progress Notes (Signed)
Initial Nutrition Assessment  DOCUMENTATION CODES:  Underweight  INTERVENTION:  Recommend liberalizing diet to regular.  Add Ensure Plus High Protein po BID, each supplement provides 350 kcal and 20 grams of protein.   Add MVI with minerals daily.  Encourage PO and supplement intake.  NUTRITION DIAGNOSIS:  Increased nutrient needs related to chronic illness (CKD) as evidenced by estimated needs.  GOAL:  Patient will meet greater than or equal to 90% of their needs  MONITOR:  PO intake, Supplement acceptance, Diet advancement, Labs, Weight trends, I & O's  REASON FOR ASSESSMENT:  Malnutrition Screening Tool    ASSESSMENT:  87 yo female with a PMH of chronic ambulation dysfunction on rolling walker, PAF, HTN, CKD stage 3b, chronic iron deficiency anemia, PMR on steroids, hydroxychloroquine, osteoporosis, and chronic lumbar spine fracture on TLSO who presented with syncopal episode.  RD working remotely. Attempted to call patient's room phone. Pt did not answer.  Per Epic, pt has eaten an average of 41% (0-100%) of the last 4 meals.  No recent weight history in Epic. Per Care Everywhere, pt weighed 43.7 kg on 03/18/2021. Currently, pt weighs 38 kg.  Pt has lost ~12.5 lbs (13%) in the past 4.5 months, which is significant and severe for the time frame.  Suspect patient is severely malnourished but cannot definitively diagnose at this time.  Recommend liberalizing diet due to age and low weight for more menu choices. RD to message MD regarding this.  Medications: reviewed; ferrous sulfate, Protonix, prednisone, NaCl @ 75 ml/hr  Labs: reviewed  NUTRITION - FOCUSED PHYSICAL EXAM: Unable to perform - defer to follow-up  Diet Order:   Diet Order             Diet regular Room service appropriate? Yes; Fluid consistency: Thin  Diet effective now                  EDUCATION NEEDS:  No education needs have been identified at this time  Skin:  Skin Assessment: Reviewed  RN Assessment (Abrasions, ecchymosis)  Last BM:  08/05/21  Height:  Ht Readings from Last 1 Encounters:  08/04/21 5\' 2"  (1.575 m)   Weight:  Wt Readings from Last 1 Encounters:  08/06/21 38 kg   BMI:  Body mass index is 15.31 kg/m.  Estimated Nutritional Needs:  Kcal:  1450-1650 Protein:  55-70 grams Fluid:  >1.5 L  08/08/21, RD, LDN (she/her/hers) Clinical Inpatient Dietitian RD Pager/After-Hours/Weekend Pager # in [place]

## 2021-08-07 DIAGNOSIS — R410 Disorientation, unspecified: Secondary | ICD-10-CM

## 2021-08-07 DIAGNOSIS — I951 Orthostatic hypotension: Secondary | ICD-10-CM

## 2021-08-07 LAB — BASIC METABOLIC PANEL
Anion gap: 9 (ref 5–15)
BUN: 17 mg/dL (ref 8–23)
CO2: 17 mmol/L — ABNORMAL LOW (ref 22–32)
Calcium: 8.1 mg/dL — ABNORMAL LOW (ref 8.9–10.3)
Chloride: 114 mmol/L — ABNORMAL HIGH (ref 98–111)
Creatinine, Ser: 1.17 mg/dL — ABNORMAL HIGH (ref 0.44–1.00)
GFR, Estimated: 45 mL/min — ABNORMAL LOW (ref >60)
Glucose, Bld: 81 mg/dL (ref 70–99)
Potassium: 3.6 mmol/L (ref 3.5–5.1)
Sodium: 140 mmol/L (ref 135–145)

## 2021-08-07 MED ORDER — LATANOPROST 0.005 % OP SOLN
1.0000 [drp] | Freq: Every day | OPHTHALMIC | Status: DC
  Administered 2021-08-08 – 2021-08-12 (×5): 1 [drp] via OPHTHALMIC
  Filled 2021-08-07: qty 2.5

## 2021-08-07 MED ORDER — QUETIAPINE FUMARATE 50 MG PO TABS
50.0000 mg | ORAL_TABLET | ORAL | Status: DC
  Administered 2021-08-07: 20:00:00 50 mg via ORAL
  Filled 2021-08-07: qty 1

## 2021-08-07 NOTE — Progress Notes (Signed)
PROGRESS NOTE    Jodi Snyder  [id number] DOB: 1932-07-18 DOA: 08/03/2021 PCP: [name]., MD   Brief Narrative:  Patient is a 87 year old female with chronic ambulation dysfunction on rolling walker, PAF on Eliquis, HTN, CKD stage IIIb, chronic iron deficiency anemia, PMR on steroids, hydroxychloroquine, osteoporosis, chronic lumbar spine fracture on TLSO presented with syncopal episode. Patient lives by herself, has been getting recently more forgetful.  She has chronic problem of dehydration and prepares 3 bottles of 8 ounces water at bedside every day to prevent that.  On the morning of admission she noticed that she still had 2 bottles of water not finished from yesterday.  Patient went to use the bathroom and while sitting on the toilet, started to feel dizzy.  When she stood up to reach her walker, she missed and fell into the bathtub and hit her head on the left side.  Patient has a known L1 compression fracture and was recently prescribed a TLSO brace but had not been using it for ambulation.  She also reported headache on the left and neck pain, back pain which is chronic. CT head showed left parietal scalp hematoma otherwise CT head and neck showed no acute fracture or dislocation.  CT spine showed L1 and T3 compression fracture, chronic  Assessment & Plan:   Principal Problem:   Syncope Active Problems:   Syncope, vasovagal   Syncope/dehydration/orthostatic hypotension: Likely either vasovagal or secondary to orthostatic hypotension.  Although better than yesterday but still looks clinically dehydrated.   -2D echo 01/2017 had shown EF of 60 to 65%, G2 DD.  Nuclear medicine stress test EF 65%, low risk study in 12/2019 -Troponins 24-21, EKG showed rate 64, normal sinus rhythm, no acute ST-T wave changes suggestive of  ischemia.  Orthostatic vital signs ordered but not done yet again, I have notified the nurse..  We will continue IV fluids.  Unwitnessed fall: Reportedly  Pt found on the floor at the foot of the bed on her bottom around 3 AM on 08/05/2021.  Pt denied hitting head or new pain at the time however next morning, she was complaining of right hip pain, x-ray right hip negative for fracture.    Hypokalemia: Resolved.  CKD stage IIIb: at baseline.   Acute on chronic back pain, T3 compression fracture, L1 compression fracture -Patient had L1 compression fracture from a fall sustained last month, stable appearance on CT.  She was recently prescribed vasopressin PT evaluation -CT C-spine showed no fractures, compression fracture at T1, T3 with loss of height anteriorly 80%, posterior bowing of the posterior margin of vertebral body, fracture of the T3 right facet, likely chronic per CT chest abdomen pelvis.  Per friend who is at the bedside, patient lives at independent living facility, they were already working on finding a place for her at assisted living facility however she is going to need higher level of care at [facility] before she returns to assisted living facility.   Dementia/delirium: Very lethargic this morning, Seroquel was given to her after 10 PM.  We will switch the time to 9 PM.  Continue delirium precautions as below.  She does not have any focal deficit, doubt stroke.  I will be able to assess her more when she is more alert. 1. Avoid benzodiazepines, antihistamines, anticholinergics, and minimize opiate use as these may worsen delirium. 2: Assess, prevent and manage pain as lack of treatment can result in delirium.  3: Provide appropriate lighting and clear signage; a clock  and calendar should be easily visible to the patient. 4: Monitor environmental factors. Reduce light and noise at night (close shades, turn off lights, turn off TV, ect). Correct any alterations in sleep cycle. 5: Reorient the patient to person, place, time and situation on each encounter.  6: Correct sensory deficits if possible (replace eye glasses, hearing aids, ect). 7:  Avoid restraints if able. Severely delirious patients benefit from constant observation by a sitter.  Paroxysmal atrial fibrillation -Rate controlled, continue Cardizem, amiodaro -Continue apixaban 2.5 mg twice daily   Essential hypertension: Very well controlled, continue lisinopril, diltiazem, as needed hydralazine.   Polymyalgia rheumatica -Continue prednisone 5 mg daily, hydroxychloroquine, -Continue PPI   Underweight, protein calorie malnutrition Estimated body mass index is 17.71 kg/m as calculated from the following:   Height as of this encounter: 5\' 3"  (1.6 m).   Weight as of this encounter: 45.4 kg  DVT prophylaxis: apixaban (ELIQUIS) tablet 2.5 mg Start: 08/04/21 1000   Code Status: DNR  Family Communication: None at bedside.  Discussed with son over the phone  Status is: Inpatient  Remains inpatient appropriate because: Still delirious and very weak  Estimated body mass index is 15.32 kg/m as calculated from the following:   Height as of this encounter: 5\' 2"  (1.575 m).   Weight as of this encounter: 38 kg.  Nutritional Assessment: Body mass index is 15.32 kg/m.08/06/21 Seen by dietician.  I agree with the assessment and plan as outlined below: Nutrition Status: Nutrition Problem: Increased nutrient needs Etiology: chronic illness (CKD) Skin Assessment: I have examined the patient's skin and I agree with the wound assessment as performed by the wound care RN as outlined below:  Consultants:  None  Procedures:  None  Antimicrobials:  Anti-infectives (From admission, onward)    Start     Dose/Rate Route Frequency Ordered Stop   08/04/21 1000  hydroxychloroquine (PLAQUENIL) tablet 200 mg        200 mg Oral Daily 08/03/21 1831            Subjective:  Seen and examined.  Sleepy and too lethargic to have conversation.  Easily arousable though.  Objective: Vitals:   08/06/21 1645 08/06/21 2013 08/07/21 0402 08/07/21 1111  BP: (!) 145/76  (!) 147/77  138/69  Pulse: 72  77 90  Resp: 20  20 20   Temp: 98.3 F (36.8 C) 98.3 F (36.8 C) 98 F (36.7 C)   TempSrc: Oral Oral Oral   SpO2: 100%  100% 99%  Weight:   38 kg   Height:        Intake/Output Summary (Last 24 hours) at 08/07/2021 1157 Last data filed at 08/07/2021 1115 Gross per 24 hour  Intake 313.87 ml  Output 580 ml  Net -266.13 ml    Filed Weights   08/05/21 0411 08/06/21 0255 08/07/21 0402  Weight: 38.1 kg 38 kg 38 kg    Examination:  General exam: Appears sleepy but comfortable and easily arousable. Respiratory system: Clear to auscultation. Respiratory effort normal. Cardiovascular system: S1 & S2 heard, irregularly irregular rate and rhythm. No JVD, murmurs, rubs, gallops or clicks. No pedal edema. Gastrointestinal system: Abdomen is nondistended, soft and nontender. No organomegaly or masses felt. Normal bowel sounds heard. Central nervous system: Lethargic, not oriented.  No focal deficit.  Data Reviewed: I have personally reviewed following labs and imaging studies  CBC: Recent Labs  Lab 08/03/21 1442 08/03/21 1506 08/04/21 0443 08/05/21 0918  WBC 13.3*  --  6.8 10.3  NEUTROABS  --   --   --  8.9*  HGB 12.2 12.6 10.8* 10.6*  HCT 38.4 37.0 35.2* 33.0*  MCV 102.4*  --  104.1* 100.0  PLT 187  --  156 146*    Basic Metabolic Panel: Recent Labs  Lab 08/03/21 1442 08/03/21 1506 08/04/21 0443 08/05/21 0918 08/06/21 0232 08/07/21 0249  NA 141 140 140 138 137 140  K 4.1 3.7 3.4* 3.1* 4.1 3.6  CL 105 105 106 105 111 114*  CO2 26  --  25 21* 18* 17*  GLUCOSE 116* 110* 82 89 89 81  BUN 24* 27* 19 15 14 17   CREATININE 1.41* 1.50* 1.22* 1.22* 1.28* 1.17*  CALCIUM 9.0  --  8.4* 8.8* 8.4* 8.1*  MG  --   --   --  1.9  --   --     GFR: Estimated Creatinine Clearance: 20.3 mL/min (A) (by C-G formula based on SCr of 1.17 mg/dL (H)). Liver Function Tests: Recent Labs  Lab 08/03/21 1442  AST 52*  ALT 32  ALKPHOS 65  BILITOT 1.0  PROT 6.5   ALBUMIN 3.8    No results for input(s): LIPASE, AMYLASE in the last 168 hours. No results for input(s): AMMONIA in the last 168 hours. Coagulation Profile: Recent Labs  Lab 08/03/21 1442  INR 1.2    Cardiac Enzymes: No results for input(s): CKTOTAL, CKMB, CKMBINDEX, TROPONINI in the last 168 hours. BNP (last 3 results) No results for input(s): PROBNP in the last 8760 hours. HbA1C: No results for input(s): HGBA1C in the last 72 hours. CBG: No results for input(s): GLUCAP in the last 168 hours. Lipid Profile: No results for input(s): CHOL, HDL, LDLCALC, TRIG, CHOLHDL, LDLDIRECT in the last 72 hours. Thyroid Function Tests: No results for input(s): TSH, T4TOTAL, FREET4, T3FREE, THYROIDAB in the last 72 hours. Anemia Panel: No results for input(s): VITAMINB12, FOLATE, FERRITIN, TIBC, IRON, RETICCTPCT in the last 72 hours. Sepsis Labs: Recent Labs  Lab 08/03/21 1514  LATICACIDVEN 1.5     Recent Results (from the past 240 hour(s))  Resp Panel by RT-PCR (Flu A&B, Covid) Nasopharyngeal Swab     Status: None   Collection Time: 08/03/21  2:42 PM   Specimen: Nasopharyngeal Swab; Nasopharyngeal(NP) swabs in vial transport medium  Result Value Ref Range Status   SARS Coronavirus 2 by RT PCR NEGATIVE NEGATIVE Final    Comment: (NOTE) SARS-CoV-2 target nucleic acids are NOT DETECTED.  The SARS-CoV-2 RNA is generally detectable in upper respiratory specimens during the acute phase of infection. The lowest concentration of SARS-CoV-2 viral copies this assay can detect is 138 copies/mL. A negative result does not preclude SARS-Cov-2 infection and should not be used as the sole basis for treatment or other patient management decisions. A negative result may occur with  improper specimen collection/handling, submission of specimen other than nasopharyngeal swab, presence of viral mutation(s) within the areas targeted by this assay, and inadequate number of viral copies(<138  copies/mL). A negative result must be combined with clinical observations, patient history, and epidemiological information. The expected result is Negative.  Fact Sheet for Patients:  EntrepreneurPulse.com.au  Fact Sheet for Healthcare Providers:  IncredibleEmployment.be  This test is no t yet approved or cleared by the [place] FDA and  has been authorized for detection and/or diagnosis of SARS-CoV-2 by FDA under an Emergency Use Authorization (EUA). This EUA will remain  in effect (meaning this test can be used) for the duration of the COVID-19 declaration  under Section 564(b)(1) of the Act, 21 U.S.C.section 360bbb-3(b)(1), unless the authorization is terminated  or revoked sooner.       Influenza A by PCR NEGATIVE NEGATIVE Final   Influenza B by PCR NEGATIVE NEGATIVE Final    Comment: (NOTE) The Xpert Xpress SARS-CoV-2/FLU/RSV plus assay is intended as an aid in the diagnosis of influenza from Nasopharyngeal swab specimens and should not be used as a sole basis for treatment. Nasal washings and aspirates are unacceptable for Xpert Xpress SARS-CoV-2/FLU/RSV testing.  Fact Sheet for Patients: EntrepreneurPulse.com.au  Fact Sheet for Healthcare Providers: IncredibleEmployment.be  This test is not yet approved or cleared by the [place] FDA and has been authorized for detection and/or diagnosis of SARS-CoV-2 by FDA under an Emergency Use Authorization (EUA). This EUA will remain in effect (meaning this test can be used) for the duration of the COVID-19 declaration under Section 564(b)(1) of the Act, 21 U.S.C. section 360bbb-3(b)(1), unless the authorization is terminated or revoked.  Performed at [facility], [place] [address] [place], [place] [address]        Radiology Studies: No results found.  Scheduled Meds:  amiodarone  200 mg Oral Daily   apixaban  2.5 mg Oral BID    brinzolamide  1 drop Both Eyes TID   calcitonin (salmon)  1 spray Alternating Nares Daily   cycloSPORINE  1 drop Both Eyes BID   diltiazem  120 mg Oral Daily   feeding supplement  237 mL Oral BID BM   ferrous sulfate  325 mg Oral Daily   hydroxychloroquine  200 mg Oral Daily   latanoprost  1 drop Both Eyes QHS   lisinopril  10 mg Oral Daily   multivitamin with minerals  1 tablet Oral Daily   pantoprazole  40 mg Oral Daily   predniSONE  5 mg Oral Daily   QUEtiapine  50 mg Oral Q24H   Continuous Infusions:  sodium chloride 75 mL/hr at 08/07/21 0029     LOS: 2 days   Time spent: 25 minutes   Darliss Cheney, MD [facility]  08/07/2021, 11:57 AM  Please page via Shea [facility] and do not message via secure chat for anything urgent. Secure chat can be used for anything non urgent.  How to contact the [facility] Attending or Consulting provider [place] or covering provider during after hours [place], for this patient?  Check the care team in [facility] and look for a) attending/consulting [facility] provider listed and b) the [facility] team listed. Page or secure chat [phone]. Log into [email or web address] and use [place]'s universal password to access. If you do not have the password, please contact the hospital operator. Locate the [facility] provider you are looking for under [facility] and page to a number that you can be directly reached. If you still have difficulty reaching the provider, please page the [facility] (Director on Call) for the Hospitalists listed on amion for assistance.

## 2021-08-07 NOTE — Progress Notes (Signed)
OT Cancellation Note  Patient Details Name: Jodi Snyder MRN: [id number] DOB: 07/01/1932   Cancelled Treatment:    Reason Eval/Treat Not Completed: Other (comment);Fatigue/lethargy limiting ability to participate.  Spoke with rn who reports pts. Family had stated pt. Had not been resting and needed to rest.  Rn reports she had just provided meds per family request to aide in pain management to allow pt. To rest. Pt. Had just fallen asleep per CNA who was bedside.  Per family request and reports from rn and cna will check back as able.    Alessandra Bevels Lorraine-COTA/L 08/07/2021, 11:51 AM

## 2021-08-07 NOTE — Progress Notes (Signed)
Physical Therapy Treatment Patient Details Name: Jodi Snyder MRN: [id number] DOB: [birth year] Today's Date: 08/07/2021   History of Present Illness 87 y.o. female who presented 12/19 with syncope and fall, noted previous L1 fracture and T3 fracture on imaging.  Pt is poor historian, has aggravation of back pain and frequent fall history.  Had negative head CT and is awaiting a TLSO.   PMH: atrial fibrillation, hypertension, discoid lupus, polymyalgia rheumatica, L1 fracture    PT Comments    Pt is making good, steady progress with mobility, ambulating up to ~12 ft this date with a RW and min-modA. Pt was able to progress from a strong posterior lean and needing modA to prevent LOB upon her first standing bout to only needing minA on her second bout due to improved awareness and reactional strategies to reduce her posterior lean. She benefits from less tactile cues at her posterior aspect to prevent LOB when standing. She remains at high risk for falls. She is still requiring mod-maxA for bed mobility and min-modA for transfers due to gross weakness and poor sequencing capabilities. Will continue to follow acutely. Current recommendations remain appropriate.   Recommendations for follow up therapy are one component of a multi-disciplinary discharge planning process, led by the attending physician.  Recommendations may be updated based on patient status, additional functional criteria and insurance authorization.  Follow Up Recommendations  Skilled nursing-short term rehab (<3 hours/day)     Assistance Recommended at Discharge Frequent or constant Supervision/Assistance  Equipment Recommendations  Rolling walker (2 wheels)    Recommendations for Other Services       Precautions / Restrictions Precautions Precautions: Fall;Back Precaution Booklet Issued: No Precaution Comments: Provided cues to maintain precautions throughout; sensative to light per family; sitter; incontinent in  standing Required Braces or Orthoses: Spinal Brace Spinal Brace: Other (comment) (clamshell type brace) Restrictions Weight Bearing Restrictions: No     Mobility  Bed Mobility Overal bed mobility: Needs Assistance Bed Mobility: Rolling;Sidelying to Sit Rolling: Mod assist Sidelying to sit: Max assist       General bed mobility comments: Pt cued to flex legs and reach across body to roll either direction in bed a couple times to donn clamshell brace, modA to complete. MaxA to manage legs and trunk to sit L EOB.    Transfers Overall transfer level: Needs assistance Equipment used: Rolling walker (2 wheels) Transfers: Sit to/from Stand;Bed to chair/wheelchair/BSC Sit to Stand: Mod assist;Min assist     Step pivot transfers: Mod assist     General transfer comment: Sit to stand 1x from EOB, cuing pt to lean anteriorly and bring nose over knees with min success, modA to power up to stand with pt propping her posterior legs on the EOB with posterior lean. ModA to direct to L with RW to transfer to chair. MinA on 2nd sit to stand rep from recliner, cuing again for anterior lean and to push up from arm rest, reduced posterior lean this rep.    Ambulation/Gait Ambulation/Gait assistance: Mod assist;Min assist Gait Distance (Feet): 12 Feet (x2 bouts of ~3 ft > ~12 ft) Assistive device: Rolling walker (2 wheels) Gait Pattern/deviations: Step-to pattern;Narrow base of support;Decreased stride length;Shuffle;Leaning posteriorly Gait velocity: reduced Gait velocity interpretation: <1.31 ft/sec, indicative of household ambulator   General Gait Details: Pt with strong posterior bias initially, needing modA to step to L to transfer bed > recliner first gait bout. With second bout, had pt react to less tactile cues at her posterior aspect to  reduce her posterior lean and cued her to bring her hips towards her RW, eventually pt reduced her posterior lean and was able to ambulate anteriorly with  minA for RW management and steadying.   Stairs             Wheelchair Mobility    Modified Rankin (Stroke Patients Only)       Balance Overall balance assessment: Needs assistance Sitting-balance support: Feet supported;Bilateral upper extremity supported Sitting balance-Leahy Scale: Poor Sitting balance - Comments: UE support and minA to prevent posterior lean when hands were on bed. Min guard when pt was facilitated to lean forward with hands on RW. Postural control: Posterior lean Standing balance support: Bilateral upper extremity supported;During functional activity Standing balance-Leahy Scale: Poor Standing balance comment: Reliant on RW and min-modA depending on extent of posterior lean. Pt benefiting from less physical assistance posteriorly to stop lean so she could correct her balance and benefited from cues to place hands on anterior aspect of RW and bring hips forward to elicit an anterior weight shift.                            Cognition Arousal/Alertness: Awake/alert Behavior During Therapy: WFL for tasks assessed/performed Overall Cognitive Status: No family/caregiver present to determine baseline cognitive functioning                                 General Comments: Pt needing cues to sequence all tasks and maintain attention. Poor insight into deficits and problem-solving capabilities on how to correct her balance.        Exercises      General Comments        Pertinent Vitals/Pain Pain Assessment: Faces Faces Pain Scale: Hurts little more Pain Location: back Pain Descriptors / Indicators: Grimacing;Guarding;Discomfort Pain Intervention(s): Limited activity within patient's tolerance;Monitored during session;Repositioned    Home Living                          Prior Function            PT Goals (current goals can now be found in the care plan section) Acute Rehab PT Goals Patient Stated Goal:  agreeable to participate in session PT Goal Formulation: With patient Time For Goal Achievement: 08/18/21 Potential to Achieve Goals: Good Progress towards PT goals: Progressing toward goals    Frequency    Min 3X/week (in case pt is going back to her home)      PT Plan Current plan remains appropriate    Co-evaluation              AM-PAC PT "6 Clicks" Mobility   Outcome Measure  Help needed turning from your back to your side while in a flat bed without using bedrails?: A Lot Help needed moving from lying on your back to sitting on the side of a flat bed without using bedrails?: A Lot Help needed moving to and from a bed to a chair (including a wheelchair)?: A Lot Help needed standing up from a chair using your arms (e.g., wheelchair or bedside chair)?: A Lot Help needed to walk in hospital room?: Total Help needed climbing 3-5 steps with a railing? : Total 6 Click Score: 10    End of Session Equipment Utilized During Treatment: Gait belt;Back brace (clamshell) Activity Tolerance: Patient limited by fatigue;Patient tolerated  treatment well Patient left: with call bell/phone within reach;in chair;with nursing/sitter in room (sitter staying with pt) Nurse Communication: Mobility status PT Visit Diagnosis: Unsteadiness on feet (R26.81);Muscle weakness (generalized) (M62.81);Pain;Other abnormalities of gait and mobility (R26.89);Difficulty in walking, not elsewhere classified (R26.2) Pain - Right/Left:  (back) Pain - part of body:  (back)     Time: [id number] PT Time Calculation (min) (ACUTE ONLY): 40 min  Charges:  $Gait Training: 23-37 mins $Therapeutic Activity: 8-22 mins                     Moishe Spice, PT, DPT Acute Rehabilitation Services  Pager: [phone] Office: [place] 08/07/2021, 5:59 PM

## 2021-08-07 NOTE — Care Management Important Message (Signed)
Important Message  Patient Details  Name: Jodi Snyder MRN: [id number] Date of Birth: [birth year]   Medicare Important Message Given:  Yes     Renie Ora 08/07/2021, 8:26 AM

## 2021-08-08 DIAGNOSIS — E876 Hypokalemia: Secondary | ICD-10-CM

## 2021-08-08 DIAGNOSIS — R410 Disorientation, unspecified: Secondary | ICD-10-CM

## 2021-08-08 MED ORDER — QUETIAPINE FUMARATE 100 MG PO TABS
100.0000 mg | ORAL_TABLET | ORAL | Status: DC
  Administered 2021-08-08 – 2021-08-11 (×4): 100 mg via ORAL
  Filled 2021-08-08 (×4): qty 1

## 2021-08-08 NOTE — Progress Notes (Signed)
Pt agitated and refusing to take her PRN hydralazine

## 2021-08-08 NOTE — Progress Notes (Signed)
PROGRESS NOTE    Jodi Snyder  [id number] DOB: [birth year] DOA: 08/03/2021 PCP: [name]., MD   Brief Narrative:  Patient is a 87 year old female with chronic ambulation dysfunction on rolling walker, PAF on Eliquis, HTN, CKD stage IIIb, chronic iron deficiency anemia, PMR on steroids, hydroxychloroquine, osteoporosis, chronic lumbar spine fracture on TLSO presented with syncopal episode. Patient lives by herself, has been getting recently more forgetful.  She has chronic problem of dehydration and prepares 3 bottles of 8 ounces water at bedside every day to prevent that.  On the morning of admission she noticed that she still had 2 bottles of water not finished from yesterday.  Patient went to use the bathroom and while sitting on the toilet, started to feel dizzy.  When she stood up to reach her walker, she missed and fell into the bathtub and hit her head on the left side.  Patient has a known L1 compression fracture and was recently prescribed a TLSO brace but had not been using it for ambulation.  She also reported headache on the left and neck pain, back pain which is chronic. CT head showed left parietal scalp hematoma otherwise CT head and neck showed no acute fracture or dislocation.  CT spine showed L1 and T3 compression fracture, chronic  Assessment & Plan:   Principal Problem:   Syncope Active Problems:   Hypokalemia   Syncope, vasovagal   Orthostatic hypotension   Delirium   Syncope/dehydration/orthostatic hypotension: Likely either vasovagal or secondary to orthostatic hypotension.  -2D echo 01/2017 had shown EF of 60 to 65%, G2 DD.  Nuclear medicine stress test EF 65%, low risk study in 12/2019 -Troponins 24-21, EKG showed rate 64, normal sinus rhythm, no acute ST-T wave changes suggestive of  ischemia.  Orthostatic vital signs ordered but not done yet again despite of notifying and asking the nurse every day.  Still appears dehydrated and due to delirium, not  eating much.  We will continue IV fluids.  Unwitnessed fall: Reportedly Pt found on the floor at the foot of the bed on her bottom around 3 AM on 08/05/2021.  Pt denied hitting head or new pain at the time however next morning, she was complaining of right hip pain, x-ray right hip negative for fracture.    Hypokalemia: Resolved.  CKD stage IIIb: at baseline.   Acute on chronic back pain, T3 compression fracture, L1 compression fracture -Patient had L1 compression fracture from a fall sustained last month, stable appearance on CT.  She was recently prescribed vasopressin PT evaluation -CT C-spine showed no fractures, compression fracture at T1, T3 with loss of height anteriorly 80%, posterior bowing of the posterior margin of vertebral body, fracture of the T3 right facet, likely chronic per CT chest abdomen pelvis.  Per friend who is at the bedside, patient lives at independent living facility, they were already working on finding a place for her at assisted living facility however she is going to need higher level of care at [facility] before she returns to assisted living facility.   Dementia/delirium: Sleepy again this morning.  Per sitter, patient was up all night and actually went to sleep this morning.  Seroquel was in fact given around 8:30 PM last night.  Will increase dose further 200 mg today.  Patient did end up working with the PT yesterday. 1. Avoid benzodiazepines, antihistamines, anticholinergics, and minimize opiate use as these may worsen delirium. 2: Assess, prevent and manage pain as lack of treatment can result in  delirium.  3: Provide appropriate lighting and clear signage; a clock and calendar should be easily visible to the patient. 4: Monitor environmental factors. Reduce light and noise at night (close shades, turn off lights, turn off TV, ect). Correct any alterations in sleep cycle. 5: Reorient the patient to person, place, time and situation on each encounter.  6: Correct  sensory deficits if possible (replace eye glasses, hearing aids, ect). 7: Avoid restraints if able. Severely delirious patients benefit from constant observation by a sitter.  Paroxysmal atrial fibrillation -Rate controlled, continue Cardizem, amiodaro -Continue apixaban 2.5 mg twice daily   Essential hypertension: Very well controlled, continue lisinopril, diltiazem, as needed hydralazine.   Polymyalgia rheumatica -Continue prednisone 5 mg daily, hydroxychloroquine, -Continue PPI   Underweight, protein calorie malnutrition Estimated body mass index is 17.71 kg/m as calculated from the following:   Height as of this encounter: 5\' 3"  (1.6 m).   Weight as of this encounter: 45.4 kg I will consult nutrition today to assess her.  She is not eating much since last few days due to delirium.  DVT prophylaxis: apixaban (ELIQUIS) tablet 2.5 mg Start: 08/04/21 1000   Code Status: DNR  Family Communication: None at bedside.  Discussed with son over the phone 2 days ago.  Status is: Inpatient  Remains inpatient appropriate because: Still delirious and very weak  Estimated body mass index is 15.32 kg/m as calculated from the following:   Height as of this encounter: 5\' 2"  (1.575 m).   Weight as of this encounter: 38 kg.  Nutritional Assessment: Body mass index is 15.32 kg/m.Marland Kitchen Seen by dietician.  I agree with the assessment and plan as outlined below: Nutrition Status: Nutrition Problem: Increased nutrient needs Etiology: chronic illness (CKD) Skin Assessment: I have examined the patient's skin and I agree with the wound assessment as performed by the wound care RN as outlined below:  Consultants:  None  Procedures:  None  Antimicrobials:  Anti-infectives (From admission, onward)    Start     Dose/Rate Route Frequency Ordered Stop   08/04/21 1000  hydroxychloroquine (PLAQUENIL) tablet 200 mg        200 mg Oral Daily 08/03/21 1831            Subjective:  Patient seen  and examined.  Sitter at the bedside.  Patient too sleepy to have conversation again today.  Objective: Vitals:   08/07/21 1928 08/08/21 0555 08/08/21 0803 08/08/21 1008  BP: (!) 176/86 (!) 188/86 (!) 178/90 (!) 160/97  Pulse: 77 84 78   Resp: 19 (!) 22 (!) 21   Temp: 97.9 F (36.6 C)     TempSrc: Oral     SpO2: 100% 100%    Weight:      Height:        Intake/Output Summary (Last 24 hours) at 08/08/2021 1331 Last data filed at 08/08/2021 0827 Gross per 24 hour  Intake 1840.73 ml  Output 0 ml  Net 1840.73 ml    Filed Weights   08/05/21 0411 08/06/21 0255 08/07/21 0402  Weight: 38.1 kg 38 kg 38 kg    Examination:  General exam: Sleepy Respiratory system: Clear to auscultation. Respiratory effort normal. Cardiovascular system: S1 & S2 heard, RRR. No JVD, murmurs, rubs, gallops or clicks. No pedal edema. Gastrointestinal system: Abdomen is nondistended, soft and nontender. No organomegaly or masses felt. Normal bowel sounds heard.  Data Reviewed: I have personally reviewed following labs and imaging studies  CBC: Recent Labs  Lab 08/03/21  1442 08/03/21 1506 08/04/21 0443 08/05/21 0918  WBC 13.3*  --  6.8 10.3  NEUTROABS  --   --   --  8.9*  HGB 12.2 12.6 10.8* 10.6*  HCT 38.4 37.0 35.2* 33.0*  MCV 102.4*  --  104.1* 100.0  PLT 187  --  156 146*    Basic Metabolic Panel: Recent Labs  Lab 08/03/21 1442 08/03/21 1506 08/04/21 0443 08/05/21 0918 08/06/21 0232 08/07/21 0249  NA 141 140 140 138 137 140  K 4.1 3.7 3.4* 3.1* 4.1 3.6  CL 105 105 106 105 111 114*  CO2 26  --  25 21* 18* 17*  GLUCOSE 116* 110* 82 89 89 81  BUN 24* 27* 19 15 14 17   CREATININE 1.41* 1.50* 1.22* 1.22* 1.28* 1.17*  CALCIUM 9.0  --  8.4* 8.8* 8.4* 8.1*  MG  --   --   --  1.9  --   --     GFR: Estimated Creatinine Clearance: 20.3 mL/min (A) (by C-G formula based on SCr of 1.17 mg/dL (H)). Liver Function Tests: Recent Labs  Lab 08/03/21 1442  AST 52*  ALT 32  ALKPHOS 65   BILITOT 1.0  PROT 6.5  ALBUMIN 3.8    No results for input(s): LIPASE, AMYLASE in the last 168 hours. No results for input(s): AMMONIA in the last 168 hours. Coagulation Profile: Recent Labs  Lab 08/03/21 1442  INR 1.2    Cardiac Enzymes: No results for input(s): CKTOTAL, CKMB, CKMBINDEX, TROPONINI in the last 168 hours. BNP (last 3 results) No results for input(s): PROBNP in the last 8760 hours. HbA1C: No results for input(s): HGBA1C in the last 72 hours. CBG: No results for input(s): GLUCAP in the last 168 hours. Lipid Profile: No results for input(s): CHOL, HDL, LDLCALC, TRIG, CHOLHDL, LDLDIRECT in the last 72 hours. Thyroid Function Tests: No results for input(s): TSH, T4TOTAL, FREET4, T3FREE, THYROIDAB in the last 72 hours. Anemia Panel: No results for input(s): VITAMINB12, FOLATE, FERRITIN, TIBC, IRON, RETICCTPCT in the last 72 hours. Sepsis Labs: Recent Labs  Lab 08/03/21 1514  LATICACIDVEN 1.5     Recent Results (from the past 240 hour(s))  Resp Panel by RT-PCR (Flu A&B, Covid) Nasopharyngeal Swab     Status: None   Collection Time: 08/03/21  2:42 PM   Specimen: Nasopharyngeal Swab; Nasopharyngeal(NP) swabs in vial transport medium  Result Value Ref Range Status   SARS Coronavirus 2 by RT PCR NEGATIVE NEGATIVE Final    Comment: (NOTE) SARS-CoV-2 target nucleic acids are NOT DETECTED.  The SARS-CoV-2 RNA is generally detectable in upper respiratory specimens during the acute phase of infection. The lowest concentration of SARS-CoV-2 viral copies this assay can detect is 138 copies/mL. A negative result does not preclude SARS-Cov-2 infection and should not be used as the sole basis for treatment or other patient management decisions. A negative result may occur with  improper specimen collection/handling, submission of specimen other than nasopharyngeal swab, presence of viral mutation(s) within the areas targeted by this assay, and inadequate number of  viral copies(<138 copies/mL). A negative result must be combined with clinical observations, patient history, and epidemiological information. The expected result is Negative.  Fact Sheet for Patients:  08/05/21  Fact Sheet for Healthcare Providers:  BloggerCourse.com  This test is no t yet approved or cleared by the [employer]ious[place].[place] FDA and  has been authorized for detection and/or diagnosis of SARS-CoV-2 by FDA under an Emergency Use Authorization (EUA). This EUA will remain  in effect (meaning this test can be used) for the duration of the COVID-19 declaration under Section 564(b)(1) of the Act, 21 U.S.C.section 360bbb-3(b)(1), unless the authorization is terminated  or revoked sooner.       Influenza A by PCR NEGATIVE NEGATIVE Final   Influenza B by PCR NEGATIVE NEGATIVE Final    Comment: (NOTE) The Xpert Xpress SARS-CoV-2/FLU/RSV plus assay is intended as an aid in the diagnosis of influenza from Nasopharyngeal swab specimens and should not be used as a sole basis for treatment. Nasal washings and aspirates are unacceptable for Xpert Xpress SARS-CoV-2/FLU/RSV testing.  Fact Sheet for Patients: EntrepreneurPulse.com.au  Fact Sheet for Healthcare Providers: IncredibleEmployment.be  This test is not yet approved or cleared by the [place] FDA and has been authorized for detection and/or diagnosis of SARS-CoV-2 by FDA under an Emergency Use Authorization (EUA). This EUA will remain in effect (meaning this test can be used) for the duration of the COVID-19 declaration under Section 564(b)(1) of the Act, 21 U.S.C. section 360bbb-3(b)(1), unless the authorization is terminated or revoked.  Performed at [facility], [place] [address] [place], [place] [address]        Radiology Studies: No results found.  Scheduled Meds:  amiodarone  200 mg Oral Daily   apixaban  2.5  mg Oral BID   brinzolamide  1 drop Both Eyes TID   calcitonin (salmon)  1 spray Alternating Nares Daily   cycloSPORINE  1 drop Both Eyes BID   diltiazem  120 mg Oral Daily   feeding supplement  237 mL Oral BID BM   ferrous sulfate  325 mg Oral Daily   hydroxychloroquine  200 mg Oral Daily   latanoprost  1 drop Both Eyes QHS   lisinopril  10 mg Oral Daily   multivitamin with minerals  1 tablet Oral Daily   pantoprazole  40 mg Oral Daily   predniSONE  5 mg Oral Daily   QUEtiapine  100 mg Oral Q24H   Continuous Infusions:  sodium chloride 75 mL/hr at 08/07/21 1353     LOS: 3 days   Time spent: 24 minutes   Darliss Cheney, MD [facility]  08/08/2021, 1:31 PM  Please page via Amion and do not message via secure chat for anything urgent. Secure chat can be used for anything non urgent.  How to contact the [facility] Attending or Consulting provider [place] or covering provider during after hours [place], for this patient?  Check the care team in [facility] and look for a) attending/consulting TRH provider listed and b) the [facility] team listed. Page or secure chat [phone]. Log into [email or web address] and use [place]'s universal password to access. If you do not have the password, please contact the hospital operator. Locate the [facility] provider you are looking for under [facility] and page to a number that you can be directly reached. If you still have difficulty reaching the provider, please page the [facility] (Director on Call) for the Hospitalists listed on amion for assistance.

## 2021-08-08 NOTE — TOC Progression Note (Signed)
Transition of Care [facility]) - Progression Note    Patient Details  Name: Jodi Snyder MRN: [id number] Date of Birth: 06/30/1932  [facility] CM/SW Contact  Patrice Paradise, LCSW Phone Number:[phone] 08/08/2021, 10:28 AM  Clinical Narrative:     CSW spoke with pt's son Arlys John in regards to pt's recommendation of ST SNF. Arlys John was aware of recommendation and was in agreement with pt going. Arlys John explained that pt has been to [facility] and stated that he was in agreement with pt being discharged to that facility. Arlys John explained that he prefers that pt go to a facility that is near her home in [facility].CSW provided Arlys John with the Medicare.gov website to review ratings. Arlys John gave pt permission to send out referrals. Arlys John confirmed that pt has been vaccinated and has one booster.  Arlys John asked about pt's insurance coverage for SNF due to pt paying before. CSW encouraged Arlys John to follow up with insurance company about coverage and explained that we would be seeking an authorization for pt's stay. CSW was able to answer Arlys John questions that he posed about dc planning.  TOC team will continue to assist with discharge planning needs.   Expected Discharge Plan: Skilled Nursing Facility Barriers to Discharge: Continued Medical Work up  Expected Discharge Plan and Services Expected Discharge Plan: Skilled Nursing Facility In-house Referral: Clinical Social Work Discharge Planning Services: CM Consult   Living arrangements for the past 2 months: Independent Living Facility                                       Social Determinants of Health (SDOH) Interventions    Readmission Risk Interventions No flowsheet data found.

## 2021-08-08 NOTE — NC FL2 (Addendum)
[place] LEVEL OF CARE SCREENING TOOL     IDENTIFICATION  Patient Name: Jodi Snyder Birthdate: 11/17/1931 Sex: female Admission Date (Current Location): 08/03/2021  [facility] and [place] Number:  Herbalist and Address:  [facility] [place], [place] [address] [place],  [address]      Provider Number: [id number]  Attending Physician Name and Address:  Darliss Cheney, MD  Relative Name and Phone Number:  Aaron Edelman [phone] [place]: Hospital Recommended Level of Care: [place] Prior Approval Number:    Date Approved/Denied:   PASRR Number: [id number]  Discharge Plan: SNF    Current Diagnoses: Patient Active Problem List   Diagnosis Date Noted   Orthostatic hypotension 08/07/2021   Delirium 08/07/2021   Syncope, vasovagal 08/03/2021   Syncope 08/03/2021   Fall    Closed compression fracture of body of L1 vertebra (HCC) 06/26/2021   Acute midline low back pain without sciatica    Acute encephalopathy 06/25/2021   Acute weakness 01/11/2020   Chest pain 01/09/2020   Acute left-sided thoracic back pain    Supraventricular tachycardia ([place]) 09/29/2019   Hypokalemia    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[place]) 08/19/2018   Asymptomatic microscopic hematuria 01/05/2018   Paroxysmal atrial fibrillation ([place]) 01/05/2018   Scoliosis 10/18/2017   Thoracogenic scoliosis of thoracolumbar region 10/08/2017   Glaucoma 06/21/2016   Drug therapy 01/28/2016   Benign hypertension with CKD (chronic kidney disease) stage III 01/16/2016   Discoid lupus erythematosus 01/16/2016   Generalized anxiety disorder 01/16/2016   Osteoporosis 01/16/2016   Polymyalgia rheumatica ([place]) 01/16/2016   Closed fracture of distal end of left radius 11/11/2014   Dry eye syndrome 08/03/2012   Other states following surgery of eye and adnexa 04/21/2012   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   Low-tension glaucoma, unspecified  eye, stage unspecified 04/07/2012   Pseudophakia of both eyes 04/07/2012    Orientation RESPIRATION BLADDER Height & Weight     Self  Normal Continent Weight: 83 lb 12.4 oz (38 kg) Height:  5\' 2"  (157.5 cm)  BEHAVIORAL SYMPTOMS/MOOD NEUROLOGICAL BOWEL NUTRITION STATUS      Continent Diet (See DC summary)  AMBULATORY STATUS COMMUNICATION OF NEEDS Skin   Limited Assist Verbally Other (Comment), PU Stage and Appropriate Care (abrasions and ecchymosis-head, leg)                       Personal Care Assistance Level of Assistance  Bathing, Feeding, Dressing Bathing Assistance: Limited assistance Feeding assistance: Independent Dressing Assistance: Limited assistance     Functional Limitations Info  Sight, Hearing, Speech Sight Info: Impaired Hearing Info: Adequate Speech Info: Adequate    SPECIAL CARE FACTORS FREQUENCY  PT (By licensed PT), OT (By licensed OT)     PT Frequency: 5x per week OT Frequency: 5x per week            Contractures Contractures Info: Not present    Additional Factors Info  Code Status, Allergies Code Status Info: DNR Allergies Info: Penicillins, Brimonidine, Other, Timolol Maleate, Sulfa Antibiotics, Sulfasalazine           Current Medications (08/08/2021):  This is the current hospital active medication list Current Facility-Administered Medications  Medication Dose Route Frequency Provider Last Rate Last Admin   0.9 %  sodium chloride infusion   Intravenous Continuous Darliss Cheney, MD 75 mL/hr at 08/07/21 1353 New Bag at 08/07/21 1353   acetaminophen (TYLENOL) tablet  650 mg  650 mg Oral Q6H PRN Wynetta Fines T, MD   650 mg at 08/08/21 [id number]   Or   acetaminophen (TYLENOL) suppository 650 mg  650 mg Rectal Q6H PRN Wynetta Fines T, MD       amiodarone (PACERONE) tablet 200 mg  200 mg Oral Daily Wynetta Fines T, MD   200 mg at 08/08/21 1008   apixaban (ELIQUIS) tablet 2.5 mg  2.5 mg Oral BID Wynetta Fines T, MD   2.5 mg at 08/08/21 1009    brinzolamide (AZOPT) 1 % ophthalmic suspension 1 drop  1 drop Both Eyes TID Wynetta Fines T, MD   1 drop at 08/08/21 1007   calcitonin (salmon) (MIACALCIN/FORTICAL) nasal spray 1 spray  1 spray Alternating Nares Daily Heloise Purpura, RPH   1 spray at 08/08/21 1008   cycloSPORINE (RESTASIS) 0.05 % ophthalmic emulsion 1 drop  1 drop Both Eyes BID Wynetta Fines T, MD   1 drop at 08/08/21 1008   diltiazem (CARDIZEM CD) 24 hr capsule 120 mg  120 mg Oral Daily Wynetta Fines T, MD   120 mg at 08/08/21 1008   feeding supplement (ENSURE ENLIVE / ENSURE PLUS) liquid 237 mL  237 mL Oral BID BM Pahwani, Einar Grad, MD   237 mL at 08/07/21 1326   ferrous sulfate tablet 325 mg  325 mg Oral Daily Wynetta Fines T, MD   325 mg at 08/08/21 1009   haloperidol lactate (HALDOL) injection 1-2 mg  1-2 mg Intravenous Q6H PRN Etta Quill, DO   2 mg at 08/05/21 1958   hydrALAZINE (APRESOLINE) tablet 25 mg  25 mg Oral Q6H PRN Wynetta Fines T, MD   25 mg at 08/08/21 [id number]   hydroxychloroquine (PLAQUENIL) tablet 200 mg  200 mg Oral Daily Wynetta Fines T, MD   200 mg at 08/08/21 1009   latanoprost (XALATAN) 0.005 % ophthalmic solution 1 drop  1 drop Both Eyes QHS Pahwani, Ravi, MD       lisinopril (ZESTRIL) tablet 10 mg  10 mg Oral Daily Wynetta Fines T, MD   10 mg at 08/08/21 1008   multivitamin with minerals tablet 1 tablet  1 tablet Oral Daily Darliss Cheney, MD   1 tablet at 08/08/21 1008   ondansetron (ZOFRAN) tablet 4 mg  4 mg Oral Q6H PRN Wynetta Fines T, MD       Or   ondansetron [facility]) injection 4 mg  4 mg Intravenous Q6H PRN Wynetta Fines T, MD       pantoprazole (PROTONIX) EC tablet 40 mg  40 mg Oral Daily Wynetta Fines T, MD   40 mg at 08/08/21 1009   polyethylene glycol (MIRALAX / GLYCOLAX) packet 34 g  34 g Oral BID PRN Skeet Simmer, RPH       predniSONE (DELTASONE) tablet 5 mg  5 mg Oral Daily Wynetta Fines T, MD   5 mg at 08/08/21 1008   QUEtiapine (SEROQUEL) tablet 100 mg  100 mg Oral Q24H Pahwani, Einar Grad, MD        senna-docusate (Senokot-S) tablet 1 tablet  1 tablet Oral BID PRN Lequita Halt, MD       traMADol-acetaminophen (ULTRACET) 37.5-325 MG per tablet 1 tablet  1 tablet Oral Q8H PRN Lequita Halt, MD   1 tablet at 08/07/21 1805     Discharge Medications: Please see discharge summary for a list of discharge medications.  Relevant Imaging Results:  Relevant Lab Results:   Additional Information  SSN# [id number] pt has been vaccinated and one booster  Patrice Paradise, LCSW

## 2021-08-09 LAB — BASIC METABOLIC PANEL
Anion gap: 9 (ref 5–15)
BUN: 17 mg/dL (ref 8–23)
CO2: 17 mmol/L — ABNORMAL LOW (ref 22–32)
Calcium: 8.3 mg/dL — ABNORMAL LOW (ref 8.9–10.3)
Chloride: 115 mmol/L — ABNORMAL HIGH (ref 98–111)
Creatinine, Ser: 1 mg/dL (ref 0.44–1.00)
GFR, Estimated: 55 mL/min — ABNORMAL LOW (ref >60)
Glucose, Bld: 79 mg/dL (ref 70–99)
Potassium: 3.4 mmol/L — ABNORMAL LOW (ref 3.5–5.1)
Sodium: 141 mmol/L (ref 135–145)

## 2021-08-09 LAB — MAGNESIUM: Magnesium: 1.9 mg/dL (ref 1.7–2.4)

## 2021-08-09 LAB — CBC WITH DIFFERENTIAL/PLATELET
Abs Immature Granulocytes: 0.01 10*3/uL (ref 0.00–0.07)
Basophils Absolute: 0 10*3/uL (ref 0.0–0.1)
Basophils Relative: 0 %
Eosinophils Absolute: 0.1 10*3/uL (ref 0.0–0.5)
Eosinophils Relative: 1 %
HCT: 28 % — ABNORMAL LOW (ref 36.0–46.0)
Hemoglobin: 8.9 g/dL — ABNORMAL LOW (ref 12.0–15.0)
Immature Granulocytes: 0 %
Lymphocytes Relative: 8 %
Lymphs Abs: 0.5 10*3/uL — ABNORMAL LOW (ref 0.7–4.0)
MCH: 31.9 pg (ref 26.0–34.0)
MCHC: 31.8 g/dL (ref 30.0–36.0)
MCV: 100.4 fL — ABNORMAL HIGH (ref 80.0–100.0)
Monocytes Absolute: 0.4 10*3/uL (ref 0.1–1.0)
Monocytes Relative: 5 %
Neutro Abs: 6.2 10*3/uL (ref 1.7–7.7)
Neutrophils Relative %: 86 %
Platelets: 147 10*3/uL — ABNORMAL LOW (ref 150–400)
RBC: 2.79 MIL/uL — ABNORMAL LOW (ref 3.87–5.11)
RDW: 14.6 % (ref 11.5–15.5)
WBC: 7.2 10*3/uL (ref 4.0–10.5)
nRBC: 0 % (ref 0.0–0.2)

## 2021-08-09 MED ORDER — LISINOPRIL 20 MG PO TABS
40.0000 mg | ORAL_TABLET | Freq: Every day | ORAL | Status: DC
  Administered 2021-08-09 – 2021-08-11 (×3): 40 mg via ORAL
  Filled 2021-08-09 (×3): qty 2

## 2021-08-09 MED ORDER — POTASSIUM CHLORIDE CRYS ER 20 MEQ PO TBCR
40.0000 meq | EXTENDED_RELEASE_TABLET | Freq: Once | ORAL | Status: AC
  Administered 2021-08-09: 10:00:00 40 meq via ORAL
  Filled 2021-08-09: qty 2

## 2021-08-09 NOTE — Progress Notes (Signed)
PROGRESS NOTE    Jodi Snyder  TIR:[id number] DOB: [birth year] DOA: 08/03/2021 PCP: Elspeth Cho., MD   Brief Narrative:  Patient is a 87 year old female with chronic ambulation dysfunction on rolling walker, PAF on Eliquis, HTN, CKD stage IIIb, chronic iron deficiency anemia, PMR on steroids, hydroxychloroquine, osteoporosis, chronic lumbar spine fracture on TLSO presented with syncopal episode. Patient lives by herself, has been getting recently more forgetful.  She has chronic problem of dehydration and prepares 3 bottles of 8 ounces water at bedside every day to prevent that.  On the morning of admission she noticed that she still had 2 bottles of water not finished from yesterday.  Patient went to use the bathroom and while sitting on the toilet, started to feel dizzy.  When she stood up to reach her walker, she missed and fell into the bathtub and hit her head on the left side.  Patient has a known L1 compression fracture and was recently prescribed a TLSO brace but had not been using it for ambulation.  She also reported headache on the left and neck pain, back pain which is chronic. CT head showed left parietal scalp hematoma otherwise CT head and neck showed no acute fracture or dislocation.  CT spine showed L1 and T3 compression fracture, chronic  Assessment & Plan:   Principal Problem:   Syncope Active Problems:   Hypokalemia   Syncope, vasovagal   Orthostatic hypotension   Delirium   Syncope/dehydration/orthostatic hypotension: Likely either vasovagal or secondary to orthostatic hypotension.  -2D echo 01/2017 had shown EF of 60 to 65%, G2 DD.  Nuclear medicine stress test EF 65%, low risk study in 12/2019 -Troponins 24-21, EKG showed rate 64, normal sinus rhythm, no acute ST-T wave changes suggestive of  ischemia.  Orthostatic vital signs ordered since several days but they were never completed despite of reminding the nurses again and again.  Nonetheless, she has received  enough fluids that orthostatics might not be helpful anymore.   Unwitnessed fall: Reportedly Pt found on the floor at the foot of the bed on her bottom around 3 AM on 08/05/2021.  Pt denied hitting head or new pain at the time however next morning, she was complaining of right hip pain, x-ray right hip negative for fracture.    Hypokalemia: Low again, will replace.  CKD stage IIIb: Better than baseline.   Acute on chronic back pain, T3 compression fracture, L1 compression fracture -Patient had L1 compression fracture from a fall sustained last month, stable appearance on CT.  She was recently prescribed vasopressin PT evaluation -CT C-spine showed no fractures, compression fracture at T1, T3 with loss of height anteriorly 80%, posterior bowing of the posterior margin of vertebral body, fracture of the T3 right facet, likely chronic per CT chest abdomen pelvis.  Per friend who is at the bedside, patient lives at independent living facility, they were already working on finding a place for her at assisted living facility however she is going to need higher level of care at [facility] before she returns to assisted living facility.   Dementia/delirium: Alert, awake and oriented today.  Big difference compared to yesterday.  Seroquel 100 mg nightly appears to be working for her so I will continue that.  Her room was once again dark in the morning when I visited her.  Delirium precautions are ordered but are not being followed.  Advised RN to please follow delirium precautions as mentioned below. 1. Avoid benzodiazepines, antihistamines, anticholinergics, and minimize opiate use  as these may worsen delirium. 2: Assess, prevent and manage pain as lack of treatment can result in delirium.  3: Provide appropriate lighting and clear signage; a clock and calendar should be easily visible to the patient. 4: Monitor environmental factors. Reduce light and noise at night (close shades, turn off lights, turn off TV,  ect). Correct any alterations in sleep cycle. 5: Reorient the patient to person, place, time and situation on each encounter.  6: Correct sensory deficits if possible (replace eye glasses, hearing aids, ect). 7: Avoid restraints if able. Severely delirious patients benefit from constant observation by a sitter.  Although she is more alert and oriented today but with the combination of her altered mental status and uncontrolled hypertension, I am concerned about possible stroke.  I have ordered MRI brain.  Paroxysmal atrial fibrillation -Rate controlled, continue Cardizem, amiodaron -Continue apixaban 2.5 mg twice daily   Essential hypertension: Blood pressure elevated, continue diltiazem but will increase her lisinopril to 40 mg from 10 mg.   Polymyalgia rheumatica -Continue prednisone 5 mg daily, hydroxychloroquine, -Continue PPI   Underweight, protein calorie malnutrition Estimated body mass index is 17.71 kg/m as calculated from the following:   Height as of this encounter: 5\' 3"  (1.6 m).   Weight as of this encounter: 45.4 kg Dietitian consulted yesterday and again today.  Still waiting for them to see her.  DVT prophylaxis: apixaban (ELIQUIS) tablet 2.5 mg Start: 08/04/21 1000   Code Status: DNR  Family Communication: None at bedside.  Discussed with son over the phone 2 days ago.  Status is: Inpatient  Remains inpatient appropriate because: Improving from delirium.  Very weak.  Will eventually need SNF.  Estimated body mass index is 17.14 kg/m as calculated from the following:   Height as of this encounter: 5\' 2"  (1.575 m).   Weight as of this encounter: 42.5 kg.  Nutritional Assessment: Body mass index is 17.14 kg/m.Marland Kitchen Seen by dietician.  I agree with the assessment and plan as outlined below: Nutrition Status: Nutrition Problem: Increased nutrient needs Etiology: chronic illness (CKD) Skin Assessment: I have examined the patient's skin and I agree with the wound  assessment as performed by the wound care RN as outlined below:  Consultants:  None  Procedures:  None  Antimicrobials:  Anti-infectives (From admission, onward)    Start     Dose/Rate Route Frequency Ordered Stop   08/04/21 1000  hydroxychloroquine (PLAQUENIL) tablet 200 mg        200 mg Oral Daily 08/03/21 1831            Subjective:  Seen and examined.  For the first time in last 4 days, she is alert and oriented.  No complaints.  Objective: Vitals:   08/09/21 0315 08/09/21 0935 08/09/21 0937 08/09/21 1219  BP:  (!) 145/79 (!) 145/79 (!) 151/67  Pulse:  66  60  Resp:  (!) 21  18  Temp:      TempSrc:      SpO2:  99%  99%  Weight: 42.5 kg     Height:        Intake/Output Summary (Last 24 hours) at 08/09/2021 1439 Last data filed at 08/09/2021 [id number] Gross per 24 hour  Intake --  Output 1450 ml  Net -1450 ml    Filed Weights   08/06/21 0255 08/07/21 0402 08/09/21 0315  Weight: 38 kg 38 kg 42.5 kg    Examination:  General exam: Appears calm and comfortable  Respiratory system: Clear  to auscultation. Respiratory effort normal. Cardiovascular system: S1 & S2 heard, RRR. No JVD, murmurs, rubs, gallops or clicks. No pedal edema. Gastrointestinal system: Abdomen is nondistended, soft and nontender. No organomegaly or masses felt. Normal bowel sounds heard. Central nervous system: Alert and oriented. No focal neurological deficits. Extremities: Symmetric 5 x 5 power. Skin: No rashes, lesions or ulcers.    Data Reviewed: I have personally reviewed following labs and imaging studies  CBC: Recent Labs  Lab 08/03/21 1442 08/03/21 1506 08/04/21 0443 08/05/21 0918 08/09/21 0042  WBC 13.3*  --  6.8 10.3 7.2  NEUTROABS  --   --   --  8.9* 6.2  HGB 12.2 12.6 10.8* 10.6* 8.9*  HCT 38.4 37.0 35.2* 33.0* 28.0*  MCV 102.4*  --  104.1* 100.0 100.4*  PLT 187  --  156 146* 147*    Basic Metabolic Panel: Recent Labs  Lab 08/04/21 0443 08/05/21 0918  08/06/21 0232 08/07/21 0249 08/09/21 0042  NA 140 138 137 140 141  K 3.4* 3.1* 4.1 3.6 3.4*  CL 106 105 111 114* 115*  CO2 25 21* 18* 17* 17*  GLUCOSE 82 89 89 81 79  BUN 19 15 14 17 17   CREATININE 1.22* 1.22* 1.28* 1.17* 1.00  CALCIUM 8.4* 8.8* 8.4* 8.1* 8.3*  MG  --  1.9  --   --  1.9    GFR: Estimated Creatinine Clearance: 26.6 mL/min (by C-G formula based on SCr of 1 mg/dL). Liver Function Tests: Recent Labs  Lab 08/03/21 1442  AST 52*  ALT 32  ALKPHOS 65  BILITOT 1.0  PROT 6.5  ALBUMIN 3.8    No results for input(s): LIPASE, AMYLASE in the last 168 hours. No results for input(s): AMMONIA in the last 168 hours. Coagulation Profile: Recent Labs  Lab 08/03/21 1442  INR 1.2    Cardiac Enzymes: No results for input(s): CKTOTAL, CKMB, CKMBINDEX, TROPONINI in the last 168 hours. BNP (last 3 results) No results for input(s): PROBNP in the last 8760 hours. HbA1C: No results for input(s): HGBA1C in the last 72 hours. CBG: No results for input(s): GLUCAP in the last 168 hours. Lipid Profile: No results for input(s): CHOL, HDL, LDLCALC, TRIG, CHOLHDL, LDLDIRECT in the last 72 hours. Thyroid Function Tests: No results for input(s): TSH, T4TOTAL, FREET4, T3FREE, THYROIDAB in the last 72 hours. Anemia Panel: No results for input(s): VITAMINB12, FOLATE, FERRITIN, TIBC, IRON, RETICCTPCT in the last 72 hours. Sepsis Labs: Recent Labs  Lab 08/03/21 1514  LATICACIDVEN 1.5     Recent Results (from the past 240 hour(s))  Resp Panel by RT-PCR (Flu A&B, Covid) Nasopharyngeal Swab     Status: None   Collection Time: 08/03/21  2:42 PM   Specimen: Nasopharyngeal Swab; Nasopharyngeal(NP) swabs in vial transport medium  Result Value Ref Range Status   SARS Coronavirus 2 by RT PCR NEGATIVE NEGATIVE Final    Comment: (NOTE) SARS-CoV-2 target nucleic acids are NOT DETECTED.  The SARS-CoV-2 RNA is generally detectable in upper respiratory specimens during the acute phase of  infection. The lowest concentration of SARS-CoV-2 viral copies this assay can detect is 138 copies/mL. A negative result does not preclude SARS-Cov-2 infection and should not be used as the sole basis for treatment or other patient management decisions. A negative result may occur with  improper specimen collection/handling, submission of specimen other than nasopharyngeal swab, presence of viral mutation(s) within the areas targeted by this assay, and inadequate number of viral copies(<138 copies/mL). A negative result  must be combined with clinical observations, patient history, and epidemiological information. The expected result is Negative.  Fact Sheet for Patients:  EntrepreneurPulse.com.au  Fact Sheet for Healthcare Providers:  IncredibleEmployment.be  This test is no t yet approved or cleared by the [place] FDA and  has been authorized for detection and/or diagnosis of SARS-CoV-2 by FDA under an Emergency Use Authorization (EUA). This EUA will remain  in effect (meaning this test can be used) for the duration of the COVID-19 declaration under Section 564(b)(1) of the Act, 21 U.S.C.section 360bbb-3(b)(1), unless the authorization is terminated  or revoked sooner.       Influenza A by PCR NEGATIVE NEGATIVE Final   Influenza B by PCR NEGATIVE NEGATIVE Final    Comment: (NOTE) The Xpert Xpress SARS-CoV-2/FLU/RSV plus assay is intended as an aid in the diagnosis of influenza from Nasopharyngeal swab specimens and should not be used as a sole basis for treatment. Nasal washings and aspirates are unacceptable for Xpert Xpress SARS-CoV-2/FLU/RSV testing.  Fact Sheet for Patients: EntrepreneurPulse.com.au  Fact Sheet for Healthcare Providers: IncredibleEmployment.be  This test is not yet approved or cleared by the [place] FDA and has been authorized for detection and/or diagnosis of SARS-CoV-2  by FDA under an Emergency Use Authorization (EUA). This EUA will remain in effect (meaning this test can be used) for the duration of the COVID-19 declaration under Section 564(b)(1) of the Act, 21 U.S.C. section 360bbb-3(b)(1), unless the authorization is terminated or revoked.  Performed at [facility], [place] [address], [place], [place] [address]        Radiology Studies: No results found.  Scheduled Meds:  amiodarone  200 mg Oral Daily   apixaban  2.5 mg Oral BID   brinzolamide  1 drop Both Eyes TID   calcitonin (salmon)  1 spray Alternating Nares Daily   cycloSPORINE  1 drop Both Eyes BID   diltiazem  120 mg Oral Daily   feeding supplement  237 mL Oral BID BM   ferrous sulfate  325 mg Oral Daily   hydroxychloroquine  200 mg Oral Daily   latanoprost  1 drop Both Eyes QHS   lisinopril  40 mg Oral Daily   multivitamin with minerals  1 tablet Oral Daily   pantoprazole  40 mg Oral Daily   predniSONE  5 mg Oral Daily   QUEtiapine  100 mg Oral Q24H   Continuous Infusions:  sodium chloride 75 mL/hr at 08/08/21 1614     LOS: 4 days   Time spent: 25 minutes   Darliss Cheney, MD [facility]  08/09/2021, 2:39 PM  Please page via [place] and do not message via secure chat for anything urgent. Secure chat can be used for anything non urgent.  How to contact the [facility] Attending or Consulting provider [place] or covering provider during after hours [place], for this patient?  Check the care team in [facility] and look for a) attending/consulting [facility] provider listed and b) the [facility] team listed. Page or secure chat [phone]. Log into [email or web address] and use [place]'s universal password to access. If you do not have the password, please contact the hospital operator. Locate the [facility] provider you are looking for under [facility] and page to a number that you can be directly reached. If you still have difficulty reaching the provider, please page the [facility] (Director on Call) for  the Hospitalists listed on amion for assistance.

## 2021-08-10 ENCOUNTER — Inpatient Hospital Stay (HOSPITAL_COMMUNITY): Payer: Medicare PPO

## 2021-08-10 MED ORDER — HYDRALAZINE HCL 20 MG/ML IJ SOLN: 10.0000 mg | Freq: Four times a day (QID) | INTRAMUSCULAR | Status: DC | PRN

## 2021-08-10 MED ORDER — ENSURE ENLIVE PO LIQD
237.0000 mL | Freq: Three times a day (TID) | ORAL | Status: DC
  Administered 2021-08-10 – 2021-08-11 (×4): 237 mL via ORAL

## 2021-08-10 MED ORDER — BOOST / RESOURCE BREEZE PO LIQD CUSTOM
1.0000 | ORAL | Status: DC
  Administered 2021-08-10 – 2021-08-11 (×2): 1 via ORAL

## 2021-08-10 NOTE — Progress Notes (Signed)
Physical Therapy Treatment Patient Details Name: Jodi Snyder MRN: [id number] DOB: 1932/04/14 Today's Date: 08/10/2021   History of Present Illness 87 y.o. female who presented 12/19 with syncope and fall, noted previous L1 fracture and T3 fracture on imaging.  Pt is poor historian, has aggravation of back pain and frequent fall history.  Had negative head CT.   PMH: atrial fibrillation, hypertension, discoid lupus, polymyalgia rheumatica, L1 fracture    PT Comments    Pt progressing slowly towards her physical therapy goals. She is pleasantly confused and not attempting to pull at lines/leads. Session focused on bed mobility, transfer training and toileting. Pt requiring up to two person moderate assist for transfers. She benefited from practicing serial sit to stands with multimodal cues for technique to achieve anterior weight shift and safe hand/foot placement. Pt continues with cognitive deficits, poor balance, back pain, and generalized weakness. Continue to recommend SNF for ongoing Physical Therapy.      Recommendations for follow up therapy are one component of a multi-disciplinary discharge planning process, led by the attending physician.  Recommendations may be updated based on patient status, additional functional criteria and insurance authorization.  Follow Up Recommendations  Skilled nursing-short term rehab (<3 hours/day)     Assistance Recommended at Discharge Frequent or constant Supervision/Assistance  Equipment Recommendations  Rolling walker (2 wheels);Wheelchair (measurements PT);BSC/3in1;Wheelchair cushion (measurements PT)    Recommendations for Other Services       Precautions / Restrictions Precautions Precautions: Fall;Back Precaution Booklet Issued: No Required Braces or Orthoses: Spinal Brace Spinal Brace: Thoracolumbosacral orthotic;Applied in supine position (clamshell) Restrictions Weight Bearing Restrictions: No     Mobility  Bed  Mobility Overal bed mobility: Needs Assistance Bed Mobility: Rolling;Sidelying to Sit;Sit to Sidelying Rolling: Min assist Sidelying to sit: Max assist     Sit to sidelying: Mod assist General bed mobility comments: Pt able to roll to R/L with minA with cues provided for bending contralateral leg and reaching for bed rail. Decreased initiation with sidelying > sit, requiring maximal assist. For supine > sit, assist provided at BLE's while pt managed trunk and used bed rail to lower down    Transfers Overall transfer level: Needs assistance Equipment used: Rolling walker (2 wheels) Transfers: Sit to/from Stand;Bed to chair/wheelchair/BSC Sit to Stand: Mod assist Stand pivot transfers: Mod assist;+2 physical assistance         General transfer comment: Pt initially stood from edge of bed to walker and had urinary incontinence. Performed face to face from bed to bedside commode to complete toileting. Pt requiring up to modA, needs max multimodal cues for placing feet posteriorly, hand placement, "nose over toes," and anterior weight shift    Ambulation/Gait                   Stairs             Wheelchair Mobility    Modified Rankin (Stroke Patients Only)       Balance Overall balance assessment: Needs assistance Sitting-balance support: Feet supported Sitting balance-Leahy Scale: Fair Sitting balance - Comments: supervision   Standing balance support: Bilateral upper extremity supported Standing balance-Leahy Scale: Poor Standing balance comment: reliant on RW, posterior lean                            Cognition Arousal/Alertness: Awake/alert Behavior During Therapy: WFL for tasks assessed/performed Overall Cognitive Status: No family/caregiver present to determine baseline cognitive functioning  General Comments: Pt pleasantly confused and is not agitated. She does not realize she is in the bed as  she is lying supine. requires multimodal cueing for problem solving. poor safety awareness        Exercises Other Exercises Other Exercises: x3 serial sit to stands from edge of bed    General Comments        Pertinent Vitals/Pain Pain Assessment: Faces Faces Pain Scale: Hurts little more Pain Location: back Pain Descriptors / Indicators: Grimacing;Guarding;Discomfort Pain Intervention(s): Limited activity within patient's tolerance;Monitored during session    Home Living                          Prior Function            PT Goals (current goals can now be found in the care plan section) Acute Rehab PT Goals Patient Stated Goal: agreeable to participate Potential to Achieve Goals: Good Progress towards PT goals: Progressing toward goals    Frequency    Min 3X/week      PT Plan Current plan remains appropriate    Co-evaluation              AM-PAC PT "6 Clicks" Mobility   Outcome Measure  Help needed turning from your back to your side while in a flat bed without using bedrails?: A Little Help needed moving from lying on your back to sitting on the side of a flat bed without using bedrails?: A Lot Help needed moving to and from a bed to a chair (including a wheelchair)?: A Lot Help needed standing up from a chair using your arms (e.g., wheelchair or bedside chair)?: Total (max cues) Help needed to walk in hospital room?: Total Help needed climbing 3-5 steps with a railing? : Total 6 Click Score: 10    End of Session Equipment Utilized During Treatment: Gait belt;Back brace Activity Tolerance: Patient limited by fatigue;Patient tolerated treatment well Patient left: with call bell/phone within reach;with nursing/sitter in room;in bed Nurse Communication: Mobility status PT Visit Diagnosis: Unsteadiness on feet (R26.81);Muscle weakness (generalized) (M62.81);Pain;Other abnormalities of gait and mobility (R26.89);Difficulty in walking, not  elsewhere classified (R26.2)     Time: [id number] PT Time Calculation (min) (ACUTE ONLY): 35 min  Charges:  $Therapeutic Activity: 23-37 mins                     Wyona Almas, PT, DPT Acute Rehabilitation Services Pager [phone] Office [phone]    Deno Etienne 08/10/2021, 5:03 PM

## 2021-08-10 NOTE — Plan of Care (Signed)
  Problem: Elimination: Goal: Will not experience complications related to urinary retention Outcome: Progressing   Problem: Pain Managment: Goal: General experience of comfort will improve Outcome: Progressing   Problem: Skin Integrity: Goal: Risk for impaired skin integrity will decrease Outcome: Progressing   

## 2021-08-10 NOTE — Progress Notes (Signed)
Patient restless and agitated, pulling cardiac monitor and IV line, not easily redirectable, safety sitter at bedside. Haldol 2mg  IV given, medication less effective pt still restless and agitated.

## 2021-08-10 NOTE — Care Management Important Message (Signed)
Important Message  Patient Details  Name: Jodi Snyder MRN: [id number] Date of Birth: March 18, 1932   Medicare Important Message Given:  Yes     Renie Ora 08/10/2021, 10:18 AM

## 2021-08-10 NOTE — Progress Notes (Signed)
PROGRESS NOTE    Jodi Snyder  [id number] DOB: [birth year] DOA: 08/03/2021 PCP: [name]., MD   Brief Narrative:  Patient is a 87 year old female with chronic ambulation dysfunction on rolling walker, PAF on Eliquis, HTN, CKD stage IIIb, chronic iron deficiency anemia, PMR on steroids, hydroxychloroquine, osteoporosis, chronic lumbar spine fracture on TLSO presented with syncopal episode. Patient lives by herself, has been getting recently more forgetful.  She has chronic problem of dehydration and prepares 3 bottles of 8 ounces water at bedside every day to prevent that.  On the morning of admission she noticed that she still had 2 bottles of water not finished from yesterday.  Patient went to use the bathroom and while sitting on the toilet, started to feel dizzy.  When she stood up to reach her walker, she missed and fell into the bathtub and hit her head on the left side.  Patient has a known L1 compression fracture and was recently prescribed a TLSO brace but had not been using it for ambulation.  She also reported headache on the left and neck pain, back pain which is chronic. CT head showed left parietal scalp hematoma otherwise CT head and neck showed no acute fracture or dislocation.  CT spine showed L1 and T3 compression fracture, chronic  Assessment & Plan:   Principal Problem:   Syncope Active Problems:   Hypokalemia   Syncope, vasovagal   Orthostatic hypotension   Delirium   Syncope/dehydration/orthostatic hypotension: Likely either vasovagal or secondary to orthostatic hypotension.  -2D echo 01/2017 had shown EF of 60 to 65%, G2 DD.  Nuclear medicine stress test EF 65%, low risk study in 12/2019 -Troponins 24-21, EKG showed rate 64, normal sinus rhythm, no acute ST-T wave changes suggestive of  ischemia.  Orthostatics ordered but were never completed despite of multiple reminders to nursing staff.  Unwitnessed fall: Reportedly Pt found on the floor at the foot of  the bed on her bottom around 3 AM on 08/05/2021.  Pt denied hitting head or new pain at the time however next morning, she was complaining of right hip pain, x-ray right hip negative for fracture.    Hypokalemia: Resolved.  CKD stage IIIb: Better than baseline.   Acute on chronic back pain, T3 compression fracture, L1 compression fracture -Patient had L1 compression fracture from a fall sustained last month, stable appearance on CT.  She was recently prescribed vasopressin PT evaluation -CT C-spine showed no fractures, compression fracture at T1, T3 with loss of height anteriorly 80%, posterior bowing of the posterior margin of vertebral body, fracture of the T3 right facet, likely chronic per CT chest abdomen pelvis. patient lives at independent living facility, they were already working on finding a place for her at assisted living facility however she is going to need higher level of [facility] at [facility] before she returns to assisted living facility.   Dementia/delirium: Patient little sleepy but easily arousable and for most part she was oriented.  She appears to be turning coronary but unfortunately much slower than I anticipated.  We will continue Seroquel 100 mg nightly and follow delirium precautions. 1. Avoid benzodiazepines, antihistamines, anticholinergics, and minimize opiate use as these may worsen delirium. 2: Assess, prevent and manage pain as lack of treatment can result in delirium.  3: Provide appropriate lighting and clear signage; a clock and calendar should be easily visible to the patient. 4: Monitor environmental factors. Reduce light and noise at night (close shades, turn off lights, turn off TV,  ect). Correct any alterations in sleep cycle. 5: Reorient the patient to person, place, time and situation on each encounter.  6: Correct sensory deficits if possible (replace eye glasses, hearing aids, ect). 7: Avoid restraints if able. Severely delirious patients benefit from constant  observation by a sitter.  Although she is more alert and oriented today but with the combination of her altered mental status and uncontrolled hypertension, I am concerned about possible stroke.  I have ordered MRI brain.  Paroxysmal atrial fibrillation -Rate controlled, continue Cardizem, amiodaron -Continue apixaban 2.5 mg twice daily   Essential hypertension: Blood pressure controlled, continue home dose of diltiazem and increased dose of lisinopril.   Polymyalgia rheumatica -Continue prednisone 5 mg daily, hydroxychloroquine, -Continue PPI   Underweight, protein calorie malnutrition Estimated body mass index is 17.71 kg/m as calculated from the following:   Height as of this encounter: 5\' 3"  (1.6 m).   Weight as of this encounter: 45.4 kg Dietitian consulted twice, yesterday and day before, she has not been evaluated yet.  I have requested RN to reach out to dietitians.  Goal of care: Palliative care consulted after discussing with son.  DVT prophylaxis: apixaban (ELIQUIS) tablet 2.5 mg Start: 08/04/21 1000   Code Status: DNR  Family Communication: None at bedside.  Discussed with son Aaron Edelman over the phone.  Status is: Inpatient  Remains inpatient appropriate because: Improving from delirium.  Very weak.  Will eventually need SNF.  Estimated body mass index is 17.5 kg/m as calculated from the following:   Height as of this encounter: 5\' 2"  (1.575 m).   Weight as of this encounter: 43.4 kg.  Nutritional Assessment: Body mass index is 17.5 kg/m.Marland Kitchen Seen by dietician.  I agree with the assessment and plan as outlined below: Nutrition Status: Nutrition Problem: Increased nutrient needs Etiology: chronic illness (CKD) Skin Assessment: I have examined the patient's skin and I agree with the wound assessment as performed by the wound care RN as outlined below:  Consultants:  None  Procedures:  None  Antimicrobials:  Anti-infectives (From admission, onward)    Start      Dose/Rate Route Frequency Ordered Stop   08/04/21 1000  hydroxychloroquine (PLAQUENIL) tablet 200 mg        200 mg Oral Daily 08/03/21 1831            Subjective:  Seen and examined.  Slightly sleepy but easily arousable and oriented.  No complaints other than chronic back pain.  Objective: Vitals:   08/10/21 0406 08/10/21 0408 08/10/21 0739 08/10/21 1038  BP:  (!) 163/74 (!) 189/107 (!) 146/84  Pulse:  85 88 85  Resp:  18 20 20   Temp:  98 F (36.7 C) 98.2 F (36.8 C)   TempSrc:  Oral Oral   SpO2:  100% 100% 100%  Weight: 43.4 kg     Height:        Intake/Output Summary (Last 24 hours) at 08/10/2021 1306 Last data filed at 08/10/2021 0739 Gross per 24 hour  Intake 3183.61 ml  Output 1600 ml  Net 1583.61 ml    Filed Weights   08/07/21 0402 08/09/21 0315 08/10/21 0406  Weight: 38 kg 42.5 kg 43.4 kg    Examination:  General exam: Appears calm and comfortable  Respiratory system: Clear to auscultation. Respiratory effort normal. Cardiovascular system: S1 & S2 heard, RRR. No JVD, murmurs, rubs, gallops or clicks. No pedal edema. Gastrointestinal system: Abdomen is nondistended, soft and nontender. No organomegaly or masses felt. Normal  bowel sounds heard. Central nervous system: Alert and oriented. No focal neurological deficits. Extremities: Symmetric 5 x 5 power. Skin: No rashes, lesions or ulcers.    Data Reviewed: I have personally reviewed following labs and imaging studies  CBC: Recent Labs  Lab 08/03/21 1442 08/03/21 1506 08/04/21 0443 08/05/21 0918 08/09/21 0042  WBC 13.3*  --  6.8 10.3 7.2  NEUTROABS  --   --   --  8.9* 6.2  HGB 12.2 12.6 10.8* 10.6* 8.9*  HCT 38.4 37.0 35.2* 33.0* 28.0*  MCV 102.4*  --  104.1* 100.0 100.4*  PLT 187  --  156 146* 147*    Snyder Metabolic Panel: Recent Labs  Lab 08/04/21 0443 08/05/21 0918 08/06/21 0232 08/07/21 0249 08/09/21 0042  NA 140 138 137 140 141  K 3.4* 3.1* 4.1 3.6 3.4*  CL 106 105 111 114*  115*  CO2 25 21* 18* 17* 17*  GLUCOSE 82 89 89 81 79  BUN 19 15 14 17 17   CREATININE 1.22* 1.22* 1.28* 1.17* 1.00  CALCIUM 8.4* 8.8* 8.4* 8.1* 8.3*  MG  --  1.9  --   --  1.9    GFR: Estimated Creatinine Clearance: 27.2 mL/min (by C-G formula based on SCr of 1 mg/dL). Liver Function Tests: Recent Labs  Lab 08/03/21 1442  AST 52*  ALT 32  ALKPHOS 65  BILITOT 1.0  PROT 6.5  ALBUMIN 3.8    No results for input(s): LIPASE, AMYLASE in the last 168 hours. No results for input(s): AMMONIA in the last 168 hours. Coagulation Profile: Recent Labs  Lab 08/03/21 1442  INR 1.2    Cardiac Enzymes: No results for input(s): CKTOTAL, CKMB, CKMBINDEX, TROPONINI in the last 168 hours. BNP (last 3 results) No results for input(s): PROBNP in the last 8760 hours. HbA1C: No results for input(s): HGBA1C in the last 72 hours. CBG: No results for input(s): GLUCAP in the last 168 hours. Lipid Profile: No results for input(s): CHOL, HDL, LDLCALC, TRIG, CHOLHDL, LDLDIRECT in the last 72 hours. Thyroid Function Tests: No results for input(s): TSH, T4TOTAL, FREET4, T3FREE, THYROIDAB in the last 72 hours. Anemia Panel: No results for input(s): VITAMINB12, FOLATE, FERRITIN, TIBC, IRON, RETICCTPCT in the last 72 hours. Sepsis Labs: Recent Labs  Lab 08/03/21 1514  LATICACIDVEN 1.5     Recent Results (from the past 240 hour(s))  Resp Panel by RT-PCR (Flu A&B, Covid) Nasopharyngeal Swab     Status: None   Collection Time: 08/03/21  2:42 PM   Specimen: Nasopharyngeal Swab; Nasopharyngeal(NP) swabs in vial transport medium  Result Value Ref Range Status   SARS Coronavirus 2 by RT PCR NEGATIVE NEGATIVE Final    Comment: (NOTE) SARS-CoV-2 target nucleic acids are NOT DETECTED.  The SARS-CoV-2 RNA is generally detectable in upper respiratory specimens during the acute phase of infection. The lowest concentration of SARS-CoV-2 viral copies this assay can detect is 138 copies/mL. A negative  result does not preclude SARS-Cov-2 infection and should not be used as the sole basis for treatment or other patient management decisions. A negative result may occur with  improper specimen collection/handling, submission of specimen other than nasopharyngeal swab, presence of viral mutation(s) within the areas targeted by this assay, and inadequate number of viral copies(<138 copies/mL). A negative result must be combined with clinical observations, patient history, and epidemiological information. The expected result is Negative.  Fact Sheet for Patients:  08/05/21  Fact Sheet for Healthcare Providers:  BloggerCourse.com  This test is no  t yet approved or cleared by the [place] and  has been authorized for detection and/or diagnosis of SARS-CoV-2 by FDA under an Emergency Use Authorization (EUA). This EUA will remain  in effect (meaning this test can be used) for the duration of the COVID-19 declaration under Section 564(b)(1) of the Act, 21 U.S.C.section 360bbb-3(b)(1), unless the authorization is terminated  or revoked sooner.       Influenza A by PCR NEGATIVE NEGATIVE Final   Influenza B by PCR NEGATIVE NEGATIVE Final    Comment: (NOTE) The Xpert Xpress SARS-CoV-2/FLU/RSV plus assay is intended as an aid in the diagnosis of influenza from Nasopharyngeal swab specimens and should not be used as a sole basis for treatment. Nasal washings and aspirates are unacceptable for Xpert Xpress SARS-CoV-2/FLU/RSV testing.  Fact Sheet for Patients: EntrepreneurPulse.com.au  Fact Sheet for Healthcare Providers: IncredibleEmployment.be  This test is not yet approved or cleared by the [place] FDA and has been authorized for detection and/or diagnosis of SARS-CoV-2 by FDA under an Emergency Use Authorization (EUA). This EUA will remain in effect (meaning this test can be used)  for the duration of the COVID-19 declaration under Section 564(b)(1) of the Act, 21 U.S.C. section 360bbb-3(b)(1), unless the authorization is terminated or revoked.  Performed at [facility], [place] [address], [place], [place] [address]        Radiology Studies: MR BRAIN WO CONTRAST  Result Date: 08/10/2021 CLINICAL DATA:  87 year old female with neurologic deficit. On Eliquis. EXAM: MRI HEAD WITHOUT CONTRAST TECHNIQUE: Multiplanar, multiecho pulse sequences of the brain and surrounding structures were obtained without intravenous contrast. COMPARISON:  Brain MRI 06/26/2021.  Head CT 08/03/2021. FINDINGS: Brain: No restricted diffusion to suggest acute infarction. No midline shift, mass effect, evidence of mass lesion, ventriculomegaly, extra-axial collection or acute intracranial hemorrhage. Cervicomedullary junction and pituitary are within normal limits. Stable gray and white matter signal throughout the brain. Patchy and confluent mostly periventricular bilateral white matter T2 and FLAIR hyperintensity. Small chronic left lateral cerebellar infarct. Evidence of a chronic microhemorrhage in the left cerebellar peduncle. No other chronic cerebral blood products identified. No cerebral cortical encephalomalacia identified. T2 heterogeneity in the basal ganglia most resembles perivascular spaces. Negative thalami and brainstem. Vascular: Major intracranial vascular flow voids are stable since November. Dominant appearing right vertebral artery. Skull and upper cervical spine: Normal for age visible cervical spine, bone marrow signal. Sinuses/Orbits: Stable and negative orbits and paranasal sinuses. Other: Trace mastoid fluid has increased since November. Small midline nasopharyngeal retention cyst is stable (normal variant). Posterior left convexity scalp hematoma redemonstrated. IMPRESSION: 1. No acute intracranial abnormality. 2. Stable noncontrast MRI appearance of the brain since November  with mild to moderate for age chronic small vessel disease. 3. Posterior left scalp hematoma. Electronically Signed   By: Genevie Ann M.D.   On: 08/10/2021 09:20    Scheduled Meds:  amiodarone  200 mg Oral Daily   apixaban  2.5 mg Oral BID   brinzolamide  1 drop Both Eyes TID   calcitonin (salmon)  1 spray Alternating Nares Daily   cycloSPORINE  1 drop Both Eyes BID   diltiazem  120 mg Oral Daily   feeding supplement  237 mL Oral BID BM   ferrous sulfate  325 mg Oral Daily   hydroxychloroquine  200 mg Oral Daily   latanoprost  1 drop Both Eyes QHS   lisinopril  40 mg Oral Daily   multivitamin with minerals  1 tablet Oral  Daily   pantoprazole  40 mg Oral Daily   predniSONE  5 mg Oral Daily   QUEtiapine  100 mg Oral Q24H   Continuous Infusions:  sodium chloride 75 mL/hr at 08/10/21 1105     LOS: 5 days   Time spent: 28 minutes   Darliss Cheney, MD [facility]  08/10/2021, 1:06 PM  Please page via Shea Evans and do not message via secure chat for anything urgent. Secure chat can be used for anything non urgent.  How to contact the [facility] Attending or Consulting provider [place] or covering provider during after hours [place], for this patient?  Check the care team in [facility] and look for a) attending/consulting [facility] provider listed and b) the [facility] team listed. Page or secure chat [phone]. Log into [email or web address] and use [place]'s universal password to access. If you do not have the password, please contact the hospital operator. Locate the [facility] provider you are looking for under [facility] and page to a number that you can be directly reached. If you still have difficulty reaching the provider, please page the [facility] (Director on Call) for the Hospitalists listed on amion for assistance.

## 2021-08-10 NOTE — Progress Notes (Signed)
Nutrition Follow-up  DOCUMENTATION CODES:  Underweight  INTERVENTION:  Continue regular diet.  Increase Ensure from BID to TID.  Add [employer] daily.  Continue MVI with minerals daily.  Encourage PO and supplement intake.  NUTRITION DIAGNOSIS:  Increased nutrient needs related to chronic illness (CKD) as evidenced by estimated needs. - ongoing  GOAL:  Patient will meet greater than or equal to 90% of their needs. - not meeting  MONITOR:  PO intake, Supplement acceptance, Diet advancement, Labs, Weight trends, I & O's  REASON FOR ASSESSMENT:  Consult Assessment of nutrition requirement/status  ASSESSMENT:  87 yo female with a PMH of chronic ambulation dysfunction on rolling walker, PAF, HTN, CKD stage 3b, chronic iron deficiency anemia, PMR on steroids, hydroxychloroquine, osteoporosis, and chronic lumbar spine fracture on TLSO who presented with syncopal episode.  Pt with delirium still.  RD working remotely. Attempted to call patient's room phone. Pt did not answer.  Per meal documentation, pt eating an average of 37% of meals (10-75%).   Admit wt: 40.2 kg Current wt: 43.4 kg  Increase Ensure from BID to TID.  Pt likely malnourished to a severe degree but cannot definitively diagnose at this time.  Supplements: Ensure BID  Medications: reviewed; ferrous sulfate, MVI with minerals, Protonix, prednisone, NaCl @ 75 ml/hr, Haldol PRN (given once today)  Labs: reviewed; K 3.4 (L)  NUTRITION - FOCUSED PHYSICAL EXAM: Unable to perform - defer to in-person follow-up  Diet Order:   Diet Order             Diet regular Room service appropriate? Yes; Fluid consistency: Thin  Diet effective now                  EDUCATION NEEDS:  No education needs have been identified at this time  Skin:  Skin Assessment: Reviewed RN Assessment (Abrasions, ecchymosis)  Last BM:  08/07/21  Height:  Ht Readings from Last 1 Encounters:  08/04/21 5\' 2"  (1.575 m)    Weight:  Wt Readings from Last 1 Encounters:  08/10/21 43.4 kg   BMI:  Body mass index is 17.5 kg/m.  Estimated Nutritional Needs:  Kcal:  1450-1650 Protein:  55-70 grams Fluid:  >1.5 L  08/12/21, RD, LDN (she/her/hers) Clinical Inpatient Dietitian RD Pager/After-Hours/Weekend Pager # in [place]

## 2021-08-11 LAB — BASIC METABOLIC PANEL
Anion gap: 8 (ref 5–15)
BUN: 13 mg/dL (ref 8–23)
CO2: 19 mmol/L — ABNORMAL LOW (ref 22–32)
Calcium: 8.2 mg/dL — ABNORMAL LOW (ref 8.9–10.3)
Chloride: 114 mmol/L — ABNORMAL HIGH (ref 98–111)
Creatinine, Ser: 1.03 mg/dL — ABNORMAL HIGH (ref 0.44–1.00)
GFR, Estimated: 53 mL/min — ABNORMAL LOW (ref >60)
Glucose, Bld: 88 mg/dL (ref 70–99)
Potassium: 3.1 mmol/L — ABNORMAL LOW (ref 3.5–5.1)
Sodium: 141 mmol/L (ref 135–145)

## 2021-08-11 MED ORDER — POTASSIUM CHLORIDE CRYS ER 20 MEQ PO TBCR
40.0000 meq | EXTENDED_RELEASE_TABLET | ORAL | Status: AC
  Administered 2021-08-11 (×2): 40 meq via ORAL
  Filled 2021-08-11 (×2): qty 2

## 2021-08-11 NOTE — Progress Notes (Signed)
Patient had Aflutter rate of 120 to 130. Afutter self-converted  to SR/ST. MD on call notified. Will continue to monitor.

## 2021-08-11 NOTE — Plan of Care (Signed)
°  Problem: Clinical Measurements: °Goal: Diagnostic test results will improve °Outcome: Progressing °  °Problem: Clinical Measurements: °Goal: Respiratory complications will improve °Outcome: Progressing °  °Problem: Coping: °Goal: Level of anxiety will decrease °Outcome: Progressing °  °

## 2021-08-11 NOTE — Progress Notes (Signed)
PROGRESS NOTE    Jodi Snyder  ZOX:[id number] DOB: 08-Feb-1932 DOA: 08/03/2021 PCP: Elspeth Cho., MD   Brief Narrative:  Patient is a 87 year old female with chronic ambulation dysfunction on rolling walker, PAF on Eliquis, HTN, CKD stage IIIb, chronic iron deficiency anemia, PMR on steroids, hydroxychloroquine, osteoporosis, chronic lumbar spine fracture on TLSO presented with syncopal episode. Patient lives by herself, has been getting recently more forgetful.  She has chronic problem of dehydration and prepares 3 bottles of 8 ounces water at bedside every day to prevent that.  On the morning of admission she noticed that she still had 2 bottles of water not finished from yesterday.  Patient went to use the bathroom and while sitting on the toilet, started to feel dizzy.  When she stood up to reach her walker, she missed and fell into the bathtub and hit her head on the left side.  Patient has a known L1 compression fracture and was recently prescribed a TLSO brace but had not been using it for ambulation.  She also reported headache on the left and neck pain, back pain which is chronic. CT head showed left parietal scalp hematoma otherwise CT head and neck showed no acute fracture or dislocation.  CT spine showed L1 and T3 compression fracture, chronic.  Her main issue and reason for extended hospitalization has been delirium so far.  Assessment & Plan:   Principal Problem:   Syncope Active Problems:   Hypokalemia   Syncope, vasovagal   Orthostatic hypotension   Delirium   Syncope/dehydration/orthostatic hypotension: Likely either vasovagal or secondary to orthostatic hypotension.  -2D echo 01/2017 had shown EF of 60 to 65%, G2 DD.  Nuclear medicine stress test EF 65%, low risk study in 12/2019 -Troponins 24-21, EKG showed rate 64, normal sinus rhythm, no acute ST-T wave changes suggestive of  ischemia.  Orthostatics ordered but were never completed despite of multiple reminders to  nursing staff.  Unwitnessed fall: Reportedly Pt found on the floor at the foot of the bed on her bottom around 3 AM on 08/05/2021.  Pt denied hitting head or new pain at the time however next morning, she was complaining of right hip pain, x-ray right hip negative for fracture.    Hypokalemia: 3.1, will replace.  CKD stage IIIb: Better than baseline.   Acute on chronic back pain, T3 compression fracture, L1 compression fracture -Patient had L1 compression fracture from a fall sustained last month, stable appearance on CT.  She was recently prescribed vasopressin PT evaluation -CT C-spine showed no fractures, compression fracture at T1, T3 with loss of height anteriorly 80%, posterior bowing of the posterior margin of vertebral body, fracture of the T3 right facet, likely chronic per CT chest abdomen pelvis. patient lives at independent living facility, they were already working on finding a place for her at assisted living facility however she is going to need higher level of care at [facility] before she returns to assisted living facility.   Dementia/delirium: Due to prolonged delirium despite of taking all the measures, MRI brain was obtained which ruled out stroke.  Patient is looking much more alert and oriented today.  We will continue Seroquel 100 mg nightly and follow delirium precautions.  She still had hand mittens.  I advised RN to remove the mittens but keep the sitter in place for now for few hours, if she continues to do well, plan is to remove the sitter as well. 1. Avoid benzodiazepines, antihistamines, anticholinergics, and minimize opiate use  as these may worsen delirium. 2: Assess, prevent and manage pain as lack of treatment can result in delirium.  3: Provide appropriate lighting and clear signage; a clock and calendar should be easily visible to the patient. 4: Monitor environmental factors. Reduce light and noise at night (close shades, turn off lights, turn off TV, ect). Correct any  alterations in sleep cycle. 5: Reorient the patient to person, place, time and situation on each encounter.  6: Correct sensory deficits if possible (replace eye glasses, hearing aids, ect). 7: Avoid restraints if able. Severely delirious patients benefit from constant observation by a sitter.    Paroxysmal atrial fibrillation -Rate controlled, continue Cardizem, amiodaron -Continue apixaban 2.5 mg twice daily   Essential hypertension: Blood pressure controlled, continue home dose of diltiazem and increased dose of lisinopril.   Polymyalgia rheumatica -Continue prednisone 5 mg daily, hydroxychloroquine, -Continue PPI   Underweight, severe protein calorie malnutrition Estimated body mass index is 17.71 kg/m as calculated from the following:   Height as of this encounter: 5\' 3"  (1.6 m).   Weight as of this encounter: 45.4 kg Dietitian on board, nutrition supplements continued  Goal of care: Palliative care consulted on 08/10/2021 after discussing with son.  DVT prophylaxis: apixaban (ELIQUIS) tablet 2.5 mg Start: 08/04/21 1000   Code Status: DNR  Family Communication: None at bedside.  Discussed with son Aaron Edelman over the phone yesterday.  Status is: Inpatient  Remains inpatient appropriate because: Improving from delirium.  Very weak.  Will eventually need SNF.  Estimated body mass index is 17.14 kg/m as calculated from the following:   Height as of this encounter: 5\' 2"  (1.575 m).   Weight as of this encounter: 42.5 kg.  Nutritional Assessment: Body mass index is 17.14 kg/m.Marland Kitchen Seen by dietician.  I agree with the assessment and plan as outlined below: Nutrition Status: Nutrition Problem: Increased nutrient needs Etiology: chronic illness (CKD) Skin Assessment: I have examined the patient's skin and I agree with the wound assessment as performed by the wound care RN as outlined below:  Consultants:  None  Procedures:  None  Antimicrobials:  Anti-infectives (From  admission, onward)    Start     Dose/Rate Route Frequency Ordered Stop   08/04/21 1000  hydroxychloroquine (PLAQUENIL) tablet 200 mg        200 mg Oral Daily 08/03/21 1831            Subjective:  Seen and examined.  Patient looks brighter and alert and oriented today.  Has no complaints.  Objective: Vitals:   08/10/21 1552 08/10/21 1943 08/11/21 0413 08/11/21 0725  BP: (!) 147/80 (!) 168/80 (!) 150/70   Pulse: 78 76 78 79  Resp: 20 14 16 20   Temp:  98.4 F (36.9 C) 98 F (36.7 C) 97.9 F (36.6 C)  TempSrc:  Oral Oral Axillary  SpO2: 100% 98% 97% 97%  Weight:   42.5 kg   Height:        Intake/Output Summary (Last 24 hours) at 08/11/2021 0823 Last data filed at 08/11/2021 0819 Gross per 24 hour  Intake 160 ml  Output 1860 ml  Net -1700 ml    Filed Weights   08/09/21 0315 08/10/21 0406 08/11/21 0413  Weight: 42.5 kg 43.4 kg 42.5 kg    Examination:  General exam: Appears calm and comfortable, cachectic Respiratory system: Clear to auscultation. Respiratory effort normal. Cardiovascular system: S1 & S2 heard, RRR. No JVD, murmurs, rubs, gallops or clicks. No pedal edema. Gastrointestinal system:  Abdomen is nondistended, soft and nontender. No organomegaly or masses felt. Normal bowel sounds heard. Central nervous system: Alert and oriented. No focal neurological deficits. Extremities: Symmetric 5 x 5 power. Skin: No rashes, lesions or ulcers.    Data Reviewed: I have personally reviewed following labs and imaging studies  CBC: Recent Labs  Lab 08/05/21 0918 08/09/21 0042  WBC 10.3 7.2  NEUTROABS 8.9* 6.2  HGB 10.6* 8.9*  HCT 33.0* 28.0*  MCV 100.0 100.4*  PLT 146* 147*    Basic Metabolic Panel: Recent Labs  Lab 08/05/21 0918 08/06/21 0232 08/07/21 0249 08/09/21 0042 08/11/21 0409  NA 138 137 140 141 141  K 3.1* 4.1 3.6 3.4* 3.1*  CL 105 111 114* 115* 114*  CO2 21* 18* 17* 17* 19*  GLUCOSE 89 89 81 79 88  BUN 15 14 17 17 13   CREATININE  1.22* 1.28* 1.17* 1.00 1.03*  CALCIUM 8.8* 8.4* 8.1* 8.3* 8.2*  MG 1.9  --   --  1.9  --     GFR: Estimated Creatinine Clearance: 25.8 mL/min (A) (by C-G formula based on SCr of 1.03 mg/dL (H)). Liver Function Tests: No results for input(s): AST, ALT, ALKPHOS, BILITOT, PROT, ALBUMIN in the last 168 hours.  No results for input(s): LIPASE, AMYLASE in the last 168 hours. No results for input(s): AMMONIA in the last 168 hours. Coagulation Profile: No results for input(s): INR, PROTIME in the last 168 hours.  Cardiac Enzymes: No results for input(s): CKTOTAL, CKMB, CKMBINDEX, TROPONINI in the last 168 hours. BNP (last 3 results) No results for input(s): PROBNP in the last 8760 hours. HbA1C: No results for input(s): HGBA1C in the last 72 hours. CBG: No results for input(s): GLUCAP in the last 168 hours. Lipid Profile: No results for input(s): CHOL, HDL, LDLCALC, TRIG, CHOLHDL, LDLDIRECT in the last 72 hours. Thyroid Function Tests: No results for input(s): TSH, T4TOTAL, FREET4, T3FREE, THYROIDAB in the last 72 hours. Anemia Panel: No results for input(s): VITAMINB12, FOLATE, FERRITIN, TIBC, IRON, RETICCTPCT in the last 72 hours. Sepsis Labs: No results for input(s): PROCALCITON, LATICACIDVEN in the last 168 hours.   Recent Results (from the past 240 hour(s))  Resp Panel by RT-PCR (Flu A&B, Covid) Nasopharyngeal Swab     Status: None   Collection Time: 08/03/21  2:42 PM   Specimen: Nasopharyngeal Swab; Nasopharyngeal(NP) swabs in vial transport medium  Result Value Ref Range Status   SARS Coronavirus 2 by RT PCR NEGATIVE NEGATIVE Final    Comment: (NOTE) SARS-CoV-2 target nucleic acids are NOT DETECTED.  The SARS-CoV-2 RNA is generally detectable in upper respiratory specimens during the acute phase of infection. The lowest concentration of SARS-CoV-2 viral copies this assay can detect is 138 copies/mL. A negative result does not preclude SARS-Cov-2 infection and should not be  used as the sole basis for treatment or other patient management decisions. A negative result may occur with  improper specimen collection/handling, submission of specimen other than nasopharyngeal swab, presence of viral mutation(s) within the areas targeted by this assay, and inadequate number of viral copies(<138 copies/mL). A negative result must be combined with clinical observations, patient history, and epidemiological information. The expected result is Negative.  Fact Sheet for Patients:  EntrepreneurPulse.com.au  Fact Sheet for Healthcare Providers:  IncredibleEmployment.be  This test is no t yet approved or cleared by the [place] FDA and  has been authorized for detection and/or diagnosis of SARS-CoV-2 by FDA under an Emergency Use Authorization (EUA). This EUA will remain  in effect (meaning this test can be used) for the duration of the COVID-19 declaration under Section 564(b)(1) of the Act, 21 U.S.C.section 360bbb-3(b)(1), unless the authorization is terminated  or revoked sooner.       Influenza A by PCR NEGATIVE NEGATIVE Final   Influenza B by PCR NEGATIVE NEGATIVE Final    Comment: (NOTE) The Xpert Xpress SARS-CoV-2/FLU/RSV plus assay is intended as an aid in the diagnosis of influenza from Nasopharyngeal swab specimens and should not be used as a sole basis for treatment. Nasal washings and aspirates are unacceptable for Xpert Xpress SARS-CoV-2/FLU/RSV testing.  Fact Sheet for Patients: EntrepreneurPulse.com.au  Fact Sheet for Healthcare Providers: IncredibleEmployment.be  This test is not yet approved or cleared by the [place] FDA and has been authorized for detection and/or diagnosis of SARS-CoV-2 by FDA under an Emergency Use Authorization (EUA). This EUA will remain in effect (meaning this test can be used) for the duration of the COVID-19 declaration under Section  564(b)(1) of the Act, 21 U.S.C. section 360bbb-3(b)(1), unless the authorization is terminated or revoked.  Performed at [facility], [place] [address] [place], [place] [address]        Radiology Studies: MR BRAIN WO CONTRAST  Result Date: 08/10/2021 CLINICAL DATA:  87 year old female with neurologic deficit. On Eliquis. EXAM: MRI HEAD WITHOUT CONTRAST TECHNIQUE: Multiplanar, multiecho pulse sequences of the brain and surrounding structures were obtained without intravenous contrast. COMPARISON:  Brain MRI 06/26/2021.  Head CT 08/03/2021. FINDINGS: Brain: No restricted diffusion to suggest acute infarction. No midline shift, mass effect, evidence of mass lesion, ventriculomegaly, extra-axial collection or acute intracranial hemorrhage. Cervicomedullary junction and pituitary are within normal limits. Stable gray and white matter signal throughout the brain. Patchy and confluent mostly periventricular bilateral white matter T2 and FLAIR hyperintensity. Small chronic left lateral cerebellar infarct. Evidence of a chronic microhemorrhage in the left cerebellar peduncle. No other chronic cerebral blood products identified. No cerebral cortical encephalomalacia identified. T2 heterogeneity in the basal ganglia most resembles perivascular spaces. Negative thalami and brainstem. Vascular: Major intracranial vascular flow voids are stable since November. Dominant appearing right vertebral artery. Skull and upper cervical spine: Normal for age visible cervical spine, bone marrow signal. Sinuses/Orbits: Stable and negative orbits and paranasal sinuses. Other: Trace mastoid fluid has increased since November. Small midline nasopharyngeal retention cyst is stable (normal variant). Posterior left convexity scalp hematoma redemonstrated. IMPRESSION: 1. No acute intracranial abnormality. 2. 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  3. Posterior left scalp hematoma. Electronically Signed   By: Genevie Ann M.D.   On: 08/10/2021 09:20    Scheduled Meds:  amiodarone  200 mg Oral Daily   apixaban  2.5 mg Oral BID   brinzolamide  1 drop Both Eyes TID   calcitonin (salmon)  1 spray Alternating Nares Daily   cycloSPORINE  1 drop Both Eyes BID   diltiazem  120 mg Oral Daily   feeding supplement  1 Container Oral Q24H   feeding supplement  237 mL Oral TID BM   ferrous sulfate  325 mg Oral Daily   hydroxychloroquine  200 mg Oral Daily   latanoprost  1 drop Both Eyes QHS   lisinopril  40 mg Oral Daily   multivitamin with minerals  1 tablet Oral Daily   pantoprazole  40 mg Oral Daily   potassium chloride  40 mEq Oral Q4H   predniSONE  5 mg Oral  Daily   QUEtiapine  100 mg Oral Q24H   Continuous Infusions:  sodium chloride 75 mL/hr at 08/10/21 1105     LOS: 6 days   Time spent: 24 minutes   Darliss Cheney, MD [facility]  08/11/2021, 8:23 AM  Please page via Shea Evans and do not message via secure chat for anything urgent. Secure chat can be used for anything non urgent.  How to contact the [facility] Attending or Consulting provider [place] or covering provider during after hours [place], for this patient?  Check the care team in [facility] and look for a) attending/consulting [facility] provider listed and b) the [facility] team listed. Page or secure chat [phone]. Log into [email or web address] and use [place]'s universal password to access. If you do not have the password, please contact the hospital operator. Locate the [facility] provider you are looking for under [facility] and page to a number that you can be directly reached. If you still have difficulty reaching the provider, please page the [facility] (Director on Call) for the Hospitalists listed on amion for assistance.

## 2021-08-11 NOTE — Consult Note (Addendum)
Consultation Note Date: 08/11/2021   Patient Name: Jodi Snyder  DOB: 04-09-1932  MRN: [id number]  Age / Sex: 87 y.o., female  PCP: [name]., MD Referring Physician: Darliss Cheney, MD  Reason for Consultation: Establishing goals of care  HPI/Patient Profile: 87 y.o. female  with past medical history of PAF on Eliquis, HTN, CKD stage 3b, chronic iron deficiency anemia, PMR on steroids/hydroxychloroquine, osteoporosis, chronic lumbar spine fracture (not always using TLSO brace), noted to be ambulate with rolling walker and becoming more forgetful at home as well as poor water intake admitted on 08/03/2021 with fall after feeling she was going to pass out and then she missed her walker when standing and fell into the bathtub hitting her head on left side and noted that she lost consciousness for a few minutes. Found to have   Clinical Assessment and Goals of Care: I initially met at Jodi Snyder' bedside but no family or visitors present. Jodi Snyder does not awaken during my visit. I returned and her friend, Jodi Snyder, as well as PT at bedside. Jodi Snyder continues to be extremely lethargic. She opens her eyes briefly a couple times but immediately returns to sleep. She cannot even stay awake with PT mainly performing PROM exercises in bed. 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 Jodi Snyder shares that she and son, Jodi Snyder, are the ones that check in on Jodi Snyder. Jodi Snyder shares that Jodi Snyder does not have good quality of life and that they were needing to move her from independent care to assisted living. She indicates that this is not something that Jodi Snyder would be happy with.   I reached out to son, Jodi Snyder). We discussed her condition and poor prognosis. He shares that she has not  done very well over the past month and has had multiple falls and has become extremely frail. He shares that he recognizes similar signs in Jodi Snyder that he saw in his father prior to his death. We discussed where to go from here and what would be in Jodi Snyder' best interest and he feels that comfort care and hospice would be appropriate although Jodi Snyder is actually her HCPOA.   I reached out to [facility] and I discussed with her the same as I did with Jodi Snyder. Jodi Snyder shares that she was not in agreement with palliative care or hospice at a recent admission but is now more open to this for Jodi Snyder. She talks of feeling the weight of making this decision for someone else. Jodi Snyder shares that Jodi Snyder knew who she was and would say a few words but really slept most of the time and ate a couple bites but no liquids in the 4 hours she visited yesterday. Yesterday seemed to be a good day from reading previous notes but even on this day she does not seem to be doing well  enough to thrive. I explained that Jodi Snyder is not awake enough for any intake or even to safely attempt to take her pills. Jodi Snyder does believe that hospice would be a good option for Jodi Snyder at this stage. Jodi Snyder would like to speak with Jodi Snyder and I will call them back for their final decision.   Update: I called Jodi Snyder and she confirms that they desire comfort care and placement in [facility] facility.   All questions/concerns addressed. Emotional support provided. Updated RN, TOC, Dr. Thereasa Solo.   Primary Decision Maker HCPOA Jodi Snyder gives permission for hospice to speak and coordinate with Jodi Snyder since he lives locally    [place] facility placement for end of life care  Code Status/Advance Care Planning: DNR   Symptom Management:  PRN medications ordered to ensure comfort.  Very likely will need scheduled pain medication and medication for agitation.     Prognosis:  < 2 weeks with overall failure to thrive complicated by pain and agitation/delirium.   Discharge Planning: Hospice facility      Primary Diagnoses: Present on Admission:  Syncope  Hypokalemia   I have reviewed the medical record, interviewed the patient and family, and examined the patient. The following aspects are pertinent.  Past Medical History:  Diagnosis Date   Atrial fibrillation ([place])    Hypertension    Immune system disorder ([place])    Polymyalgia ([place])    Social History   Socioeconomic History   Marital status: Widowed    Spouse name: Not on file   Number of children: Not on file   Years of education: Not on file   Highest education level: Not on file  Occupational History   Not on file  Tobacco Use   Smoking status: Former   Smokeless tobacco: Never  Vaping Use   Vaping Use: Never used  Substance and Sexual Activity   Alcohol use: No   Drug use: No   Sexual activity: Not on file  Other Topics Concern   Not on file  Social History Narrative   Not on file   Social Determinants of Health   Financial Resource Strain: Not on file  Food Insecurity: Not on file  Transportation Needs: Not on file  Physical Activity: Not on file  Stress: Not on file  Social Connections: Not on file   Family History  Problem Relation Age of Onset   Heart attack Mother    Stroke Father    Anuerysm Father    Scheduled Meds:  amiodarone  200 mg Oral Daily   apixaban  2.5 mg Oral BID   brinzolamide  1 drop Both Eyes TID   calcitonin (salmon)  1 spray Alternating Nares Daily   cycloSPORINE  1 drop Both Eyes BID   diltiazem  120 mg Oral Daily   feeding supplement  1 Container Oral Q24H   feeding supplement  237 mL Oral TID BM   ferrous sulfate  325 mg Oral Daily   hydroxychloroquine  200 mg Oral Daily   latanoprost  1 drop Both Eyes QHS   lisinopril  40 mg Oral Daily   multivitamin with minerals  1 tablet Oral Daily   pantoprazole  40 mg Oral Daily    potassium chloride  40 mEq Oral Q4H   predniSONE  5 mg Oral Daily   QUEtiapine  100 mg Oral Q24H   Continuous Infusions:  sodium chloride 75 mL/hr  at 08/11/21 1143   PRN Meds:.acetaminophen **OR** acetaminophen, haloperidol lactate, hydrALAZINE, hydrALAZINE, ondansetron **OR** ondansetron (ZOFRAN) IV, polyethylene glycol, senna-docusate, traMADol-acetaminophen Allergies  Allergen Reactions   Penicillins Hives and Swelling    Has patient had a PCN reaction causing immediate rash, facial/tongue/throat swelling, SOB or lightheadedness with hypotension: Yes Has patient had a PCN reaction causing severe rash involving mucus membranes or skin necrosis: No Has patient had a PCN reaction that required hospitalization: No Has patient had a PCN reaction occurring within the last 10 years: No If all of the above answers are "NO", then may proceed with Cephalosporin use.    Brimonidine Other (See Comments)    Don't remember   Other     Nuts causes blisters No spices coffee   Timolol Maleate Other (See Comments)    Pt becomes faint   Sulfa Antibiotics Rash   Sulfasalazine Rash   Review of Systems  Unable to perform ROS: Acuity of condition   Physical Exam Vitals and nursing note reviewed.  Constitutional:      General: She is not in acute distress.    Appearance: She is cachectic. She is ill-appearing.     Comments: Frail, elderly Overall tense and appears uncomfortable  Cardiovascular:     Rate and Rhythm: Normal rate.  Pulmonary:     Effort: No tachypnea, accessory muscle usage or respiratory distress.  Abdominal:     General: Abdomen is flat.  Neurological:     Mental Status: She is lethargic.     Comments: Unable to awaken to interact and assess    Vital Signs: BP (!) 158/76 (BP Location: Left Arm)    Pulse 81    Temp 98.2 F (36.8 C) (Oral)    Resp 20    Ht _0  (1.575 m)    Wt 42.5 kg    SpO2 100%    BMI 17.14 kg/m  Pain Scale: 0-10   Pain Score: 0-No  pain   SpO2: SpO2: 100 % O2 Device:SpO2: 100 % O2 Flow Rate: .   IO: Intake/output summary:  Intake/Output Summary (Last 24 hours) at 08/11/2021 1329 Last data filed at 08/11/2021 1152 Gross per 24 hour  Intake 160 ml  Output 2010 ml  Net -1850 ml    LBM: Last BM Date: 08/07/21 Baseline Weight: Weight: 45.4 kg Most recent weight: Weight: 42.5 kg     Palliative Assessment/Data:     Time Total: 7 min  Greater than 50%  of this time was spent counseling and coordinating care related to the above assessment and plan.  Signed by: Vinie Sill, NP Palliative Medicine Team Pager # [phone] (M-F 8a-5p) Team Phone # [phone] (Nights/Weekends)

## 2021-08-12 DIAGNOSIS — Z7189 Other specified counseling: Secondary | ICD-10-CM

## 2021-08-12 DIAGNOSIS — R627 Adult failure to thrive: Secondary | ICD-10-CM

## 2021-08-12 DIAGNOSIS — Z515 Encounter for palliative care: Secondary | ICD-10-CM

## 2021-08-12 DIAGNOSIS — I951 Orthostatic hypotension: Secondary | ICD-10-CM

## 2021-08-12 MED ORDER — GLYCOPYRROLATE 0.2 MG/ML IJ SOLN: 0.2000 mg | INTRAMUSCULAR | Status: DC | PRN

## 2021-08-12 MED ORDER — ACETAMINOPHEN 650 MG RE SUPP: 650.0000 mg | Freq: Four times a day (QID) | RECTAL | Status: DC | PRN

## 2021-08-12 MED ORDER — BIOTENE DRY MOUTH MT LIQD: 15.0000 mL | OROMUCOSAL | Status: DC | PRN

## 2021-08-12 MED ORDER — MORPHINE SULFATE (CONCENTRATE) 10 MG/0.5ML PO SOLN
5.0000 mg | ORAL | Status: DC | PRN
  Administered 2021-08-12 – 2021-08-13 (×3): 5 mg via SUBLINGUAL
  Filled 2021-08-12 (×4): qty 0.5

## 2021-08-12 MED ORDER — POLYVINYL ALCOHOL 1.4 % OP SOLN
1.0000 [drp] | Freq: Four times a day (QID) | OPHTHALMIC | Status: DC | PRN
  Filled 2021-08-12: qty 15

## 2021-08-12 MED ORDER — GLYCOPYRROLATE 1 MG PO TABS
1.0000 mg | ORAL_TABLET | ORAL | Status: DC | PRN
  Filled 2021-08-12: qty 1

## 2021-08-12 MED ORDER — QUETIAPINE FUMARATE 50 MG PO TABS: 50.0000 mg | ORAL_TABLET | ORAL | Status: DC

## 2021-08-12 MED ORDER — ACETAMINOPHEN 325 MG PO TABS: 650.0000 mg | ORAL_TABLET | Freq: Four times a day (QID) | ORAL | Status: DC | PRN

## 2021-08-12 MED ORDER — LORAZEPAM 2 MG/ML IJ SOLN
0.5000 mg | INTRAMUSCULAR | Status: DC | PRN
Start: 2021-08-12 — End: 2021-08-13

## 2021-08-12 NOTE — Plan of Care (Signed)
°  Problem: Education: °Goal: Knowledge of General Education information will improve °Description: Including pain rating scale, medication(s)/side effects and non-pharmacologic comfort measures °Outcome: Not Progressing °  °Problem: Health Behavior/Discharge Planning: °Goal: Ability to manage health-related needs will improve °Outcome: Not Progressing °  °Problem: Activity: °Goal: Risk for activity intolerance will decrease °Outcome: Not Progressing °  °

## 2021-08-12 NOTE — Progress Notes (Signed)
Physical Therapy Treatment Patient Details Name: Jodi Snyder MRN: [id number] DOB: [birth year] Today's Date: 08/12/2021   History of Present Illness 87 y.o. female who presented 12/19 with syncope and fall, noted previous L1 fracture and T3 fracture on imaging.  Pt is poor historian, has aggravation of back pain and frequent fall history.  Had negative head CT.   PMH: atrial fibrillation, hypertension, discoid lupus, polymyalgia rheumatica, L1 fracture    PT Comments    Pt much more lethargic and with generalized pain with all mobility today, thus limited session to bed level exercises. Pt provided PROM or AAROM for all exercises due to lack of ability to remain awake and attentive to task to perform actively fully without assistance. Pt displays tightness in her biceps, gastrocs, and hamstrings, likely to her guarded position supine in bed. Pt unable to keep eyes open more than a few seconds at a time with stimulation and following only ~10% of commands today. Rolled pt to L and positioned with use of pillows to reduce pressure end of session. Will continue to follow acutely. Current recommendations remain appropriate.    Recommendations for follow up therapy are one component of a multi-disciplinary discharge planning process, led by the attending physician.  Recommendations may be updated based on patient status, additional functional criteria and insurance authorization.  Follow Up Recommendations  Skilled nursing-short term rehab (<3 hours/day)     Assistance Recommended at Discharge Frequent or constant Supervision/Assistance  Equipment Recommendations  Rolling walker (2 wheels);Wheelchair (measurements PT);BSC/3in1;Wheelchair cushion (measurements PT);Hospital bed;Other (comment) (hoyer lift and pad)    Recommendations for Other Services       Precautions / Restrictions Precautions Precautions: Fall;Back Precaution Booklet Issued: No Precaution Comments: Provided cues to  maintain precautions throughout; sensative to light per family; sitter; incontinent in standing Required Braces or Orthoses: Spinal Brace Spinal Brace: Thoracolumbosacral orthotic;Applied in supine position (clamshell) Restrictions Weight Bearing Restrictions: No     Mobility  Bed Mobility Overal bed mobility: Needs Assistance Bed Mobility: Rolling Rolling: Total assist         General bed mobility comments: Pt lethargic and with noted pain at attempts to roll to R for bed pad placement, TA to complete 1x.    Transfers                   General transfer comment: Deferred due to lethargy and pain    Ambulation/Gait               General Gait Details: Deferred due to lethargy and pain   Stairs             Wheelchair Mobility    Modified Rankin (Stroke Patients Only)       Balance       Sitting balance - Comments: Deferred due to lethargy and pain       Standing balance comment: Deferred due to lethargy and pain                            Cognition Arousal/Alertness: Lethargic Behavior During Therapy: Flat affect                                   General Comments: Pt very lethargic this date, only opening eyes briefly with stimulation and quickly falling back asleep. Pt following ~10% of commands.        Exercises  General Exercises - Upper Extremity Shoulder Flexion: AAROM;Both;10 reps;Supine;Strengthening Elbow Flexion: AAROM;Strengthening;Both;10 reps;Supine Elbow Extension: AAROM;Strengthening;Both;10 reps;Supine General Exercises - Lower Extremity Ankle Circles/Pumps: PROM;Both;10 reps;Supine Quad Sets: AAROM;Both;10 reps;Supine;Strengthening Heel Slides: AAROM;Both;10 reps;Supine;Strengthening Hip ABduction/ADduction: AAROM;Strengthening;Both;10 reps;Supine Other Exercises Other Exercises: Passive gentle stretch to bil gastrocs 3x ~20 sec each    General Comments General comments (skin integrity,  edema, etc.): Pt's friend Jodi Snyder appeared start of session      Pertinent Vitals/Pain Pain Assessment: Faces Faces Pain Scale: Hurts even more Pain Location: generalized with attempts at bed mobility Pain Descriptors / Indicators: Grimacing;Guarding;Moaning Pain Intervention(s): Limited activity within patient's tolerance;Monitored during session;Repositioned    Home Living                          Prior Function            PT Goals (current goals can now be found in the care plan section) Acute Rehab PT Goals Patient Stated Goal: agreeable to participate PT Goal Formulation: With family Time For Goal Achievement: 08/18/21 Potential to Achieve Goals: Fair Progress towards PT goals: Not progressing toward goals - comment    Frequency    Min 2X/week      PT Plan Frequency needs to be updated;Equipment recommendations need to be updated    Co-evaluation              AM-PAC PT "6 Clicks" Mobility   Outcome Measure  Help needed turning from your back to your side while in a flat bed without using bedrails?: Total Help needed moving from lying on your back to sitting on the side of a flat bed without using bedrails?: Total Help needed moving to and from a bed to a chair (including a wheelchair)?: Total Help needed standing up from a chair using your arms (e.g., wheelchair or bedside chair)?: Total Help needed to walk in hospital room?: Total Help needed climbing 3-5 steps with a railing? : Total 6 Click Score: 6    End of Session   Activity Tolerance: Patient limited by lethargy;Patient limited by pain Patient left: in bed;with call bell/phone within reach;with bed alarm set;with family/visitor present Nurse Communication: Mobility status PT Visit Diagnosis: Unsteadiness on feet (R26.81);Muscle weakness (generalized) (M62.81);Pain;Other abnormalities of gait and mobility (R26.89);Difficulty in walking, not elsewhere classified (R26.2) Pain - Right/Left:   (back) Pain - part of body:  (back)     Time: 2841-3244 PT Time Calculation (min) (ACUTE ONLY): 27 min  Charges:  $Therapeutic Exercise: 23-37 mins                     Jodi Snyder, PT, DPT Acute Rehabilitation Services  Pager: [phone] Office: [phone]    Jodi Snyder 08/12/2021, 1:47 PM

## 2021-08-12 NOTE — Progress Notes (Signed)
Jodi Snyder  URK:[id number] DOB: 1932/02/07 DOA: 08/03/2021 PCP: Elspeth Cho., MD    Brief Narrative:  87 year old with chronic ambulatory dysfunction requiring rolling walker, PAF on chronic Eliquis, HTN, CKD stage IIIb, chronic iron deficiency anemia, PMR on chronic steroids and Plaquenil, osteoporosis, and a chronic lumbar spinal fracture requiring TLSO support who presented to the ED after a syncopal spell.  She has a history of "recurrent dehydration" and reported diminished oral intake of liquids the day prior to suffering her spell.  She stood up from the toilet became dizzy and fell over into the bathtub striking the left side of her head.  CT head at presentation revealed a left parietal scalp hematoma but otherwise there were no acute findings on that or CT of her neck.  Consultants:  Palliative Care  Code Status: NO CODE BLUE  Antimicrobials:  None  DVT prophylaxis: Eliquis  Subjective: Resting comfortably at the time of my visit with no evidence of respiratory distress or uncontrolled pain.  Assessment & Plan:  Chronic recurrent dehydration with orthostatic hypotension and recurrent syncope Appears to be related primarily to poor oral intake and dehydration  Acute delirium with probable newly diagnosed dementia MRI brain without evidence of acute CVA  Unwitnessed fall during hospitalization Patient was found at the foot of her bed on the floor at 3 AM 08/05/2021 -she denied hitting her head -follow-up right hip x-ray the next morning was without evidence of fracture  Paroxysmal atrial fibrillation Continue apixaban Cardizem and amiodarone  Hypokalemia Supplement - due to very limited intake   CKD stage IIIb  Acute on chronic back pain -known T3 and L1 compression fractures No acute findings on repeat imaging of spine  Essential HTN  PMR  Underweight -severe protein calorie malnutrition   Family Communication: no family present at time of  exam Status is: Inpatient   Objective: Blood pressure 121/71, pulse 85, temperature (!) 97.5 F (36.4 C), temperature source Oral, resp. rate (!) 22, height 5\' 2"  (1.575 m), weight 41 kg, SpO2 99 %.  Intake/Output Summary (Last 24 hours) at 08/12/2021 1439 Last data filed at 08/11/2021 1955 Gross per 24 hour  Intake 20 ml  Output 200 ml  Net -180 ml   Filed Weights   08/10/21 0406 08/11/21 0413 08/12/21 0334  Weight: 43.4 kg 42.5 kg 41 kg    Examination: General: No acute respiratory distress - thin/frail  Lungs: Clear to auscultation bilaterally  Cardiovascular: Regular rate  Abdomen: soft, bowel sounds positive Extremities: No significant edema bilateral lower extremities  CBC: Recent Labs  Lab 08/09/21 0042  WBC 7.2  NEUTROABS 6.2  HGB 8.9*  HCT 28.0*  MCV 100.4*  PLT 147*   Basic Metabolic Panel: Recent Labs  Lab 08/07/21 0249 08/09/21 0042 08/11/21 0409  NA 140 141 141  K 3.6 3.4* 3.1*  CL 114* 115* 114*  CO2 17* 17* 19*  GLUCOSE 81 79 88  BUN 17 17 13   CREATININE 1.17* 1.00 1.03*  CALCIUM 8.1* 8.3* 8.2*  MG  --  1.9  --    GFR: Estimated Creatinine Clearance: 24.9 mL/min (A) (by C-G formula based on SCr of 1.03 mg/dL (H)).   Recent Results (from the past 240 hour(s))  Resp Panel by RT-PCR (Flu A&B, Covid) Nasopharyngeal Swab     Status: None   Collection Time: 08/03/21  2:42 PM   Specimen: Nasopharyngeal Swab; Nasopharyngeal(NP) swabs in vial transport medium  Result Value Ref Range Status   SARS Coronavirus 2  by RT PCR NEGATIVE NEGATIVE Final    Comment: (NOTE) SARS-CoV-2 target nucleic acids are NOT DETECTED.  The SARS-CoV-2 RNA is generally detectable in upper respiratory specimens during the acute phase of infection. The lowest concentration of SARS-CoV-2 viral copies this assay can detect is 138 copies/mL. A negative result does not preclude SARS-Cov-2 infection and should not be used as the sole basis for treatment or other patient  management decisions. A negative result may occur with  improper specimen collection/handling, submission of specimen other than nasopharyngeal swab, presence of viral mutation(s) within the areas targeted by this assay, and inadequate number of viral copies(<138 copies/mL). A negative result must be combined with clinical observations, patient history, and epidemiological information. The expected result is Negative.  Fact Sheet for Patients:  EntrepreneurPulse.com.au  Fact Sheet for Healthcare Providers:  IncredibleEmployment.be  This test is no t yet approved or cleared by the [place] FDA and  has been authorized for detection and/or diagnosis of SARS-CoV-2 by FDA under an Emergency Use Authorization (EUA). This EUA will remain  in effect (meaning this test can be used) for the duration of the COVID-19 declaration under Section 564(b)(1) of the Act, 21 U.S.C.section 360bbb-3(b)(1), unless the authorization is terminated  or revoked sooner.       Influenza A by PCR NEGATIVE NEGATIVE Final   Influenza B by PCR NEGATIVE NEGATIVE Final    Comment: (NOTE) The Xpert Xpress SARS-CoV-2/FLU/RSV plus assay is intended as an aid in the diagnosis of influenza from Nasopharyngeal swab specimens and should not be used as a sole basis for treatment. Nasal washings and aspirates are unacceptable for Xpert Xpress SARS-CoV-2/FLU/RSV testing.  Fact Sheet for Patients: EntrepreneurPulse.com.au  Fact Sheet for Healthcare Providers: IncredibleEmployment.be  This test is not yet approved or cleared by the [place] FDA and has been authorized for detection and/or diagnosis of SARS-CoV-2 by FDA under an Emergency Use Authorization (EUA). This EUA will remain in effect (meaning this test can be used) for the duration of the COVID-19 declaration under Section 564(b)(1) of the Act, 21 U.S.C. section 360bbb-3(b)(1),  unless the authorization is terminated or revoked.  Performed at [facility], [place] [address], [place], [place] [address]      Scheduled Meds:  amiodarone  200 mg Oral Daily   apixaban  2.5 mg Oral BID   brinzolamide  1 drop Both Eyes TID   calcitonin (salmon)  1 spray Alternating Nares Daily   cycloSPORINE  1 drop Both Eyes BID   diltiazem  120 mg Oral Daily   feeding supplement  1 Container Oral Q24H   feeding supplement  237 mL Oral TID BM   ferrous sulfate  325 mg Oral Daily   hydroxychloroquine  200 mg Oral Daily   latanoprost  1 drop Both Eyes QHS   lisinopril  40 mg Oral Daily   multivitamin with minerals  1 tablet Oral Daily   pantoprazole  40 mg Oral Daily   predniSONE  5 mg Oral Daily   QUEtiapine  100 mg Oral Q24H   Continuous Infusions:  sodium chloride 75 mL/hr at 08/11/21 1948     LOS: 7 days   Cherene Altes, MD [facility] Office  [phone] Pager - Text Page per Shea Evans  If 7PM-7AM, please contact night-coverage per Amion 08/12/2021, 2:39 PM

## 2021-08-12 NOTE — Care Management Important Message (Signed)
Important Message  Patient Details  Name: Jodi Snyder MRN: [id number] Date of Birth: [birth year]   Medicare Important Message Given:  Yes     Renie Ora 08/12/2021, 10:54 AM

## 2021-08-12 NOTE — TOC Progression Note (Addendum)
Transition of Care [facility]) - Progression Note    Patient Details  Name: Gracy Ehly MRN: [id number] Date of Birth: 01/29/1932  [facility] CM/SW Contact  Ivette Loyal, [place] Phone Number: 08/12/2021, 2:22 PM  Clinical Narrative:    CSW contacted admissions at [facility] to inquire about an available bed. The facility is currently testing for COVID and will not know if they cant take anyone until tomorrow. CSW Informed pt son and gave other available options such as [facility] and [facility]. Pt son stated that [facility] where the pt husband passed so they prefer [facility].  CSW contacted [place] at [facility] who confirmed bed for tomorrow, Lowella Bandy asked CSW to [facility] for DC.  CSW received a message from the NP stating that the pt daughter is the HPOA and they are thinking about hospice or or comfort care. CSW will wait to hear back from the NP to contact the family for any further DC planning.   Family would like [facility], they are reviewing.    Expected Discharge Plan: Skilled Nursing Facility Barriers to Discharge: Continued Medical Work up  Expected Discharge Plan and Services Expected Discharge Plan: Skilled Nursing Facility In-house Referral: Clinical Social Work Discharge Planning Services: CM Consult   Living arrangements for the past 2 months: Independent Living Facility                                       Social Determinants of Health (SDOH) Interventions    Readmission Risk Interventions No flowsheet data found.

## 2021-08-13 DIAGNOSIS — Z515 Encounter for palliative care: Secondary | ICD-10-CM

## 2021-08-13 MED ORDER — MORPHINE SULFATE (CONCENTRATE) 10 MG/0.5ML PO SOLN: 5.0000 mg | ORAL | 0 refills | Status: AC | PRN

## 2021-08-13 NOTE — Discharge Summary (Signed)
Physician Discharge Summary  Jodi Snyder [id number] DOB: Oct 23, 1931 DOA: 08/03/2021  PCP: [name]., MD  Admit date: 08/03/2021 Discharge date: 08/13/2021  Admitted From: home Disposition:  residential hospice  Discharge Condition: stable CODE STATUS: DNR Diet recommendation: comfort feeding  HPI: Per admitting MD, Jodi Snyder is a 87 y.o. female with medical history significant of chronic ambulation dysfunction on roller walker, PAF on Eliquis, HTN, CKD stage IIIb, chronic iron deficiency anemia, polymyalgia rheumatica on steroid and hydroxychloroquine, osteoporosis, chronic lumbar spine fracture on TLSO, came with syncope. Patient lives by herself.  She just recently's been more forgetful.  She has a chronic problem of dehydration, for that she prepared 3 bottles of 8 oz water at bedside every day to prevent her to have sufficient fluid intake.  This morning she noticed that there is still about 2 bottles of water not finished from yesterday. She then went to bathroom feeling that she might have a BM soon. While sitting of the toilet bowl, she started to feel "feeling like I am about to pass out" and she was trying to stand up to reach her walker but missed and fell into the bathtub and knocked her head on the left side. She recovered her consciousness probably few minutes later. No biting of tongue or loss urine or BM. She has a L1 compression fracture, and recently she was prescribed a result TLSO brace, but she has not been starting using it for ambulation. She complained sharp-like headache on the left side and neck pain, and back pain which is chronic, no arms or legs pain.   Hospital Course / Discharge diagnoses: Principal problem Recurrent syncope, orthostatic hypotension, chronic recurrent dehydration, failure to thrive, severe protein calorie malnutrition-patient with a progressive decline at home, currently BMI is 16.  She has been having minimal to no p.o.  intake, weakness and recurrent falls.  She appears to be having progressive dementia as well, never formally diagnosed but with more recent memory problems.  Palliative was consulted, and after discussion with the family care was transitioned towards comfort.  She has no consistent p.o. intake, life expectancy is believed to be less than 2 weeks and she will be discharged to residential hospice.  Active problems  Acute delirium with probable newly diagnosed dementia  Unwitnessed fall during hospitalization   Paroxysmal atrial fibrillation  Hypokalemia CKD stage IIIb Acute on chronic back pain  Essential HTN PMR Underweight -severe protein calorie malnutrition  Sepsis ruled out  Discharge Instructions  Allergies as of 08/13/2021       Reactions   Penicillins Hives, Swelling   Has patient had a PCN reaction causing immediate rash, facial/tongue/throat swelling, SOB or lightheadedness with hypotension: Yes Has patient had a PCN reaction causing severe rash involving mucus membranes or skin necrosis: No Has patient had a PCN reaction that required hospitalization: No Has patient had a PCN reaction occurring within the last 10 years: No If all of the above answers are "NO", then may proceed with Cephalosporin use.   Brimonidine Other (See Comments)   Don't remember   Other    Nuts causes blisters No spices coffee   Timolol Maleate Other (See Comments)   Pt becomes faint   Sulfa Antibiotics Rash   Sulfasalazine Rash        Medication List     STOP taking these medications    amiodarone 200 MG tablet Commonly known as: PACERONE   calcitonin (salmon) 200 UNIT/ACT nasal spray Commonly known as: MIACALCIN/FORTICAL  diltiazem 120 MG 24 hr capsule Commonly known as: CARDIZEM CD   Eliquis 2.5 MG Tabs tablet Generic drug: apixaban   esomeprazole 40 MG capsule Commonly known as: NEXIUM   ferrous sulfate 325 (65 FE) MG tablet   furosemide 20 MG tablet Commonly known  as: LASIX   hydroxychloroquine 200 MG tablet Commonly known as: PLAQUENIL   ICAPS AREDS 2 PO   lisinopril 10 MG tablet Commonly known as: ZESTRIL   meclizine 12.5 MG tablet Commonly known as: ANTIVERT   potassium chloride 10 MEQ tablet Commonly known as: KLOR-CON   predniSONE 5 MG tablet Commonly known as: DELTASONE   senna-docusate 8.6-50 MG tablet Commonly known as: Senokot-S   traMADol-acetaminophen 37.5-325 MG tablet Commonly known as: ULTRACET       TAKE these medications    ALPRAZolam 0.5 MG tablet Commonly known as: XANAX Take 0.25-0.5 mg by mouth 2 (two) times daily.   brinzolamide 1 % ophthalmic suspension Commonly known as: AZOPT Place 1 drop into both eyes in the morning and at bedtime.   cycloSPORINE 0.05 % ophthalmic emulsion Commonly known as: RESTASIS Place 1 drop into both eyes 2 (two) times daily.   morphine CONCENTRATE 10 MG/0.5ML Soln concentrated solution Place 0.25 mLs (5 mg total) under the tongue every 2 (two) hours as needed for severe pain or shortness of breath.   polyethylene glycol powder 17 GM/SCOOP powder Commonly known as: GLYCOLAX/MIRALAX Take 34 g by mouth 2 (two) times daily as needed for mild constipation.   Rocklatan 0.02-0.005 % Soln Generic drug: Netarsudil-Latanoprost Place 1 drop into both eyes at bedtime.        Consultations: Palliative care  Procedures/Studies:  CT HEAD WO CONTRAST  Result Date: 08/03/2021 CLINICAL DATA:  Syncopal episode with fall. EXAM: CT HEAD WITHOUT CONTRAST TECHNIQUE: Contiguous axial images were obtained from the base of the skull through the vertex without intravenous contrast. COMPARISON:  06/26/2021 FINDINGS: Brain: Generalized atrophy. Chronic small-vessel ischemic changes of the white matter. No sign of acute infarction, mass lesion, hemorrhage, hydrocephalus or extra-axial collection. Vascular: There is atherosclerotic calcification of the major vessels at the base of the brain.  Skull: No skull fracture. Sinuses/Orbits: Clear/normal Other: Left parietal scalp hematoma. IMPRESSION: No acute intracranial injury. Left parietal scalp hematoma. No underlying skull fracture. Electronically Signed   By: Paulina Fusi M.D.   On: 08/03/2021 16:37   CT CERVICAL SPINE WO CONTRAST  Result Date: 08/03/2021 CLINICAL DATA:  Syncopal episode with fall and trauma to the head and neck. EXAM: CT CERVICAL SPINE WITHOUT CONTRAST TECHNIQUE: Multidetector CT imaging of the cervical spine was performed without intravenous contrast. Multiplanar CT image reconstructions were also generated. COMPARISON:  12/06/2020 FINDINGS: Alignment: Scoliotic curvature convex to the left. Skull base and vertebrae: Compression fracture at T1 with loss of height of about 10%. Compression fracture of T3 with loss of height anteriorly of 80% and posterior bowing of the posterior margin of the vertebral body. Fracture of the right facet at T3. Soft tissues and spinal canal: No soft tissue injury seen. Disc levels: Chronic degenerative spondylosis at C5-6. No compressive stenosis visible. Upper chest: See results of chest CT. Other: None IMPRESSION: Cervicothoracic curvature convex to the left. No fracture seen in the cervical region. Compression fracture at T1 with loss of height of about 10%. Compression fracture at T3 with loss of height anteriorly of 80%. Posterior bowing of the posterior margin of the vertebral body. Fracture of the T3 right facet. Electronically Signed   By:  Nelson Chimes M.D.   On: 08/03/2021 16:40   MR BRAIN WO CONTRAST  Result Date: 08/10/2021 CLINICAL DATA:  87 year old female with neurologic deficit. On Eliquis. EXAM: MRI HEAD WITHOUT CONTRAST TECHNIQUE: Multiplanar, multiecho pulse sequences of the brain and surrounding structures were obtained without intravenous contrast. COMPARISON:  Brain MRI 06/26/2021.  Head CT 08/03/2021. FINDINGS: Brain: No restricted diffusion to suggest acute infarction. No  midline shift, mass effect, evidence of mass lesion, ventriculomegaly, extra-axial collection or acute intracranial hemorrhage. Cervicomedullary junction and pituitary are within normal limits. Stable gray and white matter signal throughout the brain. Patchy and confluent mostly periventricular bilateral white matter T2 and FLAIR hyperintensity. Small chronic left lateral cerebellar infarct. Evidence of a chronic microhemorrhage in the left cerebellar peduncle. No other chronic cerebral blood products identified. No cerebral cortical encephalomalacia identified. T2 heterogeneity in the basal ganglia most resembles perivascular spaces. Negative thalami and brainstem. Vascular: Major intracranial vascular flow voids are stable since November. Dominant appearing right vertebral artery. Skull and upper cervical spine: Normal for age visible cervical spine, bone marrow signal. Sinuses/Orbits: Stable and negative orbits and paranasal sinuses. Other: Trace mastoid fluid has increased since November. Small midline nasopharyngeal retention cyst is stable (normal variant). Posterior left convexity scalp hematoma redemonstrated. IMPRESSION: 1. No acute intracranial abnormality. 2. Stable noncontrast MRI appearance of the brain since November with mild to moderate for age chronic small vessel disease. 3. Posterior left scalp hematoma. Electronically Signed   By: Genevie Ann M.D.   On: 08/10/2021 09:20   CT CHEST ABDOMEN PELVIS W CONTRAST  Result Date: 08/03/2021 CLINICAL DATA:  Recent syncopal episode with pain, initial encounter EXAM: CT CHEST, ABDOMEN, AND PELVIS WITH CONTRAST TECHNIQUE: Multidetector CT imaging of the chest, abdomen and pelvis was performed following the standard protocol during bolus administration of intravenous contrast. CONTRAST:  39mL OMNIPAQUE IOHEXOL 300 MG/ML  SOLN COMPARISON:  12/06/2020 FINDINGS: CT CHEST FINDINGS Cardiovascular: Atherosclerotic calcifications of the thoracic aorta are noted  without aneurysmal dilatation or dissection. No cardiac enlargement is noted. Mild coronary calcifications are seen. No large central pulmonary embolus is noted although timing was not performed for embolus evaluation. Mediastinum/Nodes: Thoracic inlet is within normal limits. No sizable hilar or mediastinal adenopathy noted. The esophagus as visualized is within normal limits. Lungs/Pleura: Few scattered calcified granulomas are seen. The lungs are well aerated. Chronic scarring with calcification is noted in the upper lobes bilaterally stable from previous exam from 12/06/2020. Musculoskeletal: Degenerative changes of the thoracic spine are seen. No acute rib fracture is noted. Chronic appearing T3 compression deformity is noted which is new from the prior exam. Old healed sternal fracture is noted. CT ABDOMEN PELVIS FINDINGS Hepatobiliary: No focal liver abnormality is seen. No gallstones, gallbladder wall thickening, or biliary dilatation. Pancreas: Unremarkable. No pancreatic ductal dilatation or surrounding inflammatory changes. Spleen: Normal in size without focal abnormality. Adrenals/Urinary Tract: Adrenal glands are within normal limits bilaterally. Kidneys demonstrate cystic changes bilaterally. No renal calculi or obstructive changes are noted. Delayed images demonstrate normal excretion of contrast material. Bladder is well distended and within normal limits. Stomach/Bowel: The appendix is not well visualized although no inflammatory changes are identified to suggest appendicitis. Scattered fecal material is noted throughout the colon. Small bowel and stomach are within normal limits. Vascular/Lymphatic: Aortic atherosclerosis. No enlarged abdominal or pelvic lymph nodes. Reproductive: Status post hysterectomy. No adnexal masses. Other: No abdominal wall hernia or abnormality. No abdominopelvic ascites. Musculoskeletal: Degenerative changes of lumbar spine are noted. L1 compression deformity is  noted  which is new from prior examination from April of 2022 but stable from prior plain film from 06/16/2021. IMPRESSION: CT of the chest: Changes of prior granulomatous disease as well as calcified and noncalcified areas of scarring in the upper lobes bilaterally. T3 compression deformity new from prior exam in April of 2022 but of a chronic appearance. CT of the abdomen and pelvis: L1 compression deformity which is stable from prior plain film examination in November of 2022. No acute abnormality is noted. Electronically Signed   By: Inez Catalina M.D.   On: 08/03/2021 16:44   DG HIP UNILAT WITH PELVIS 2-3 VIEWS RIGHT  Result Date: 08/05/2021 CLINICAL DATA:  Status post fall EXAM: DG HIP (WITH OR WITHOUT PELVIS) 2-3V RIGHT COMPARISON:  None. FINDINGS: There is no evidence of hip fracture or dislocation. Limited evaluation of the sacrum due to overlying bowel gas. There is no evidence of arthropathy or other focal bone abnormality. IMPRESSION: Negative. Electronically Signed   By: Yetta Glassman M.D.   On: 08/05/2021 12:04     Subjective: Confused, comfortable  Discharge Exam: BP (!) 139/94 (BP Location: Left Arm)    Pulse 84    Temp 98 F (36.7 C) (Oral)    Resp 20    Ht 5\' 2"  (1.575 m)    Wt 41 kg    SpO2 97%    BMI 16.53 kg/m   General: NAD Cardiovascular: RRR, S1/S2 +, no rubs, no gallops Respiratory: CTA  Abdominal: Soft, NT, ND, bowel sounds +   The results of significant diagnostics from this hospitalization (including imaging, microbiology, ancillary and laboratory) are listed below for reference.     Microbiology: Recent Results (from the past 240 hour(s))  Resp Panel by RT-PCR (Flu A&B, Covid) Nasopharyngeal Swab     Status: None   Collection Time: 08/03/21  2:42 PM   Specimen: Nasopharyngeal Swab; Nasopharyngeal(NP) swabs in vial transport medium  Result Value Ref Range Status   SARS Coronavirus 2 by RT PCR NEGATIVE NEGATIVE Final    Comment: (NOTE) SARS-CoV-2 target nucleic  acids are NOT DETECTED.  The SARS-CoV-2 RNA is generally detectable in upper respiratory specimens during the acute phase of infection. The lowest concentration of SARS-CoV-2 viral copies this assay can detect is 138 copies/mL. A negative result does not preclude SARS-Cov-2 infection and should not be used as the sole basis for treatment or other patient management decisions. A negative result may occur with  improper specimen collection/handling, submission of specimen other than nasopharyngeal swab, presence of viral mutation(s) within the areas targeted by this assay, and inadequate number of viral copies(<138 copies/mL). A negative result must be combined with clinical observations, patient history, and epidemiological information. The expected result is Negative.  Fact Sheet for Patients:  EntrepreneurPulse.com.au  Fact Sheet for Healthcare Providers:  IncredibleEmployment.be  This test is no t yet approved or cleared by the [place] FDA and  has been authorized for detection and/or diagnosis of SARS-CoV-2 by FDA under an Emergency Use Authorization (EUA). This EUA will remain  in effect (meaning this test can be used) for the duration of the COVID-19 declaration under Section 564(b)(1) of the Act, 21 U.S.C.section 360bbb-3(b)(1), unless the authorization is terminated  or revoked sooner.       Influenza A by PCR NEGATIVE NEGATIVE Final   Influenza B by PCR NEGATIVE NEGATIVE Final    Comment: (NOTE) The Xpert Xpress SARS-CoV-2/FLU/RSV plus assay is intended as an aid in the diagnosis of influenza from  Nasopharyngeal swab specimens and should not be used as a sole basis for treatment. Nasal washings and aspirates are unacceptable for Xpert Xpress SARS-CoV-2/FLU/RSV testing.  Fact Sheet for Patients: EntrepreneurPulse.com.au  Fact Sheet for Healthcare Providers: IncredibleEmployment.be  This  test is not yet approved or cleared by the [place] FDA and has been authorized for detection and/or diagnosis of SARS-CoV-2 by FDA under an Emergency Use Authorization (EUA). This EUA will remain in effect (meaning this test can be used) for the duration of the COVID-19 declaration under Section 564(b)(1) of the Act, 21 U.S.C. section 360bbb-3(b)(1), unless the authorization is terminated or revoked.  Performed at [facility], [place] [address] [place], [place] [address]      Labs: Basic Metabolic Panel: Recent Labs  Lab 08/07/21 0249 08/09/21 0042 08/11/21 0409  NA 140 141 141  K 3.6 3.4* 3.1*  CL 114* 115* 114*  CO2 17* 17* 19*  GLUCOSE 81 79 88  BUN 17 17 13   CREATININE 1.17* 1.00 1.03*  CALCIUM 8.1* 8.3* 8.2*  MG  --  1.9  --    Liver Function Tests: No results for input(s): AST, ALT, ALKPHOS, BILITOT, PROT, ALBUMIN in the last 168 hours. CBC: Recent Labs  Lab 08/09/21 0042  WBC 7.2  NEUTROABS 6.2  HGB 8.9*  HCT 28.0*  MCV 100.4*  PLT 147*   CBG: No results for input(s): GLUCAP in the last 168 hours. Hgb A1c No results for input(s): HGBA1C in the last 72 hours. Lipid Profile No results for input(s): CHOL, HDL, LDLCALC, TRIG, CHOLHDL, LDLDIRECT in the last 72 hours. Thyroid function studies No results for input(s): TSH, T4TOTAL, T3FREE, THYROIDAB in the last 72 hours.  Invalid input(s): FREET3 Urinalysis    Component Value Date/Time   COLORURINE YELLOW 08/03/2021 [place] 08/03/2021 1442   LABSPEC 1.015 08/03/2021 1442   PHURINE 7.0 08/03/2021 1442   GLUCOSEU NEGATIVE 08/03/2021 1442   HGBUR NEGATIVE 08/03/2021 1442   BILIRUBINUR NEGATIVE 08/03/2021 1442   KETONESUR NEGATIVE 08/03/2021 1442   PROTEINUR NEGATIVE 08/03/2021 1442   NITRITE NEGATIVE 08/03/2021 1442   LEUKOCYTESUR NEGATIVE 08/03/2021 1442    FURTHER DISCHARGE INSTRUCTIONS:   Get Medicines reviewed and adjusted: Please take all your medications with you  for your next visit with your Primary MD   Laboratory/radiological data: Please request your Primary MD to go over all hospital tests and procedure/radiological results at the follow up, please ask your Primary MD to get all Hospital records sent to his/her office.   In some cases, they will be blood work, cultures and biopsy results pending at the time of your discharge. Please request that your primary care M.D. goes through all the records of your hospital data and follows up on these results.   Also Note the following: If you experience worsening of your admission symptoms, develop shortness of breath, life threatening emergency, suicidal or homicidal thoughts you must seek medical attention immediately by calling 911 or calling your MD immediately  if symptoms less severe.   You must read complete instructions/literature along with all the possible adverse reactions/side effects for all the Medicines you take and that have been prescribed to you. Take any new Medicines after you have completely understood and accpet all the possible adverse reactions/side effects.    Do not drive when taking Pain medications or sleeping medications (Benzodaizepines)   Do not take more than prescribed Pain, Sleep and Anxiety Medications. It is not advisable to combine anxiety,sleep  and pain medications without talking with your primary care practitioner   Special Instructions: If you have smoked or chewed Tobacco  in the last 2 yrs please stop smoking, stop any regular Alcohol  and or any Recreational drug use.   Wear Seat belts while driving.   Please note: You were cared for by a hospitalist during your hospital stay. Once you are discharged, your primary care physician will handle any further medical issues. Please note that NO REFILLS for any discharge medications will be authorized once you are discharged, as it is imperative that you return to your primary care physician (or establish a relationship  with a primary care physician if you do not have one) for your post hospital discharge needs so that they can reassess your need for medications and monitor your lab values.  Time coordinating discharge: 25 minutes  SIGNED:  Marzetta Board, MD, PhD 08/13/2021, 10:33 AM

## 2021-08-13 NOTE — Progress Notes (Addendum)
Palliative:  87 y.o. female  with past medical history of PAF on Eliquis, HTN, CKD stage 3b, chronic iron deficiency anemia, PMR on steroids/hydroxychloroquine, osteoporosis, chronic lumbar spine fracture (not always using TLSO brace), noted to be ambulate with rolling walker and becoming more forgetful at home as well as poor water intake admitted on 08/03/2021 with fall after feeling she was going to pass out and then she missed her walker when standing and fell into the bathtub hitting her head on left side and noted that she lost consciousness for a few minutes. Hospitalization complicated by delirium and pain.    I met today at Jodi Snyder's bedside with her son, Aaron Edelman. Ms. Mcvey is more awake and interactive today. She has recently had pain medication and denies pain currently (she does not remember having pain medication). Aaron Edelman reports that she had a couple sips of water and then said she was full today. We discussed plan to continue to focus on her rest and comfort. Still appropriate for hospice facility as discussed with family yesterday. Awaiting to hear when bed may be available.   All questions/concerns addressed. Emotional support provided.   Exam: Alert, oriented to person but very forgetful. No distress. Thin, frail, pale. Breathing regular, unlabored. Abd flat.   Plan: - Comfort focused care.  - No changes to PRN medications.  - Awaiting hospice placement. I continue to believe prognosis likely < 2 weeks given decline, poor intake, ongoing symptoms.   15 min  Vinie Sill, NP Palliative Medicine Team Pager [phone] (Please see [email or web address] for schedule) Team Phone [phone]    Greater than 50%  of this time was spent counseling and coordinating care related to the above assessment and plan

## 2021-08-13 NOTE — TOC Transition Note (Signed)
Transition of Care [facility]) - CM/SW Discharge Note   Patient Details  Name: Jodi Snyder MRN: [id number] Date of Birth: 1932/05/08  [facility] CM/SW Contact:  Baldemar Lenis, LCSW Phone Number: 08/13/2021, 1:57 PM   Clinical Narrative:   Transport scheduled for 3:00 PM. Hospice liaison informed, RN already called report. No further TOC needs at this time.   Final next level of care: Hospice Medical Facility Barriers to Discharge: Barriers Resolved   Patient Goals and CMS Choice        Discharge Placement                Patient to be transferred to facility by: [facility] Name of family member notified: Son Patient and family notified of of transfer: 08/13/21  Discharge Plan and Services In-house Referral: Clinical Social Work Discharge Planning Services: CM Consult                                 Social Determinants of Health (SDOH) Interventions     Readmission Risk Interventions No flowsheet data found.

## 2021-08-16 DEATH — deceased
